# Patient Record
Sex: Male | Born: 1955 | Race: Black or African American | Hispanic: No | Marital: Married | Smoking: Current every day smoker
Health system: Southern US, Community
[De-identification: ages and names within clinical notes are randomized; demographics above are authoritative.]

## PROBLEM LIST (undated history)

## (undated) DIAGNOSIS — I2510 Atherosclerotic heart disease of native coronary artery without angina pectoris: Secondary | ICD-10-CM

## (undated) DIAGNOSIS — I219 Acute myocardial infarction, unspecified: Secondary | ICD-10-CM

## (undated) DIAGNOSIS — I10 Essential (primary) hypertension: Secondary | ICD-10-CM

## (undated) DIAGNOSIS — J449 Chronic obstructive pulmonary disease, unspecified: Secondary | ICD-10-CM

## (undated) DIAGNOSIS — G4733 Obstructive sleep apnea (adult) (pediatric): Secondary | ICD-10-CM

## (undated) DIAGNOSIS — G459 Transient cerebral ischemic attack, unspecified: Secondary | ICD-10-CM

## (undated) DIAGNOSIS — C3490 Malignant neoplasm of unspecified part of unspecified bronchus or lung: Secondary | ICD-10-CM

## (undated) DIAGNOSIS — E7800 Pure hypercholesterolemia, unspecified: Secondary | ICD-10-CM

## (undated) DIAGNOSIS — I639 Cerebral infarction, unspecified: Secondary | ICD-10-CM

## (undated) HISTORY — DX: Cerebral infarction, unspecified: I63.9

## (undated) HISTORY — DX: Acute myocardial infarction, unspecified: I21.9

## (undated) HISTORY — DX: Atherosclerotic heart disease of native coronary artery without angina pectoris: I25.10

## (undated) HISTORY — PX: CARDIAC CATHETERIZATION: SHX172

## (undated) HISTORY — PX: CORONARY STENT PLACEMENT: SHX1402

## (undated) HISTORY — DX: Chronic obstructive pulmonary disease, unspecified: J44.9

## (undated) HISTORY — DX: Malignant neoplasm of unspecified part of unspecified bronchus or lung: C34.90

## (undated) HISTORY — DX: Transient cerebral ischemic attack, unspecified: G45.9

## (undated) HISTORY — DX: Obstructive sleep apnea (adult) (pediatric): G47.33

---

## 2001-06-15 ENCOUNTER — Emergency Department (HOSPITAL_COMMUNITY): Admission: EM | Admit: 2001-06-15 | Discharge: 2001-06-15 | Payer: Self-pay | Admitting: Emergency Medicine

## 2003-08-13 ENCOUNTER — Ambulatory Visit (HOSPITAL_COMMUNITY): Admission: RE | Admit: 2003-08-13 | Discharge: 2003-08-13 | Payer: Self-pay | Admitting: Internal Medicine

## 2003-11-25 ENCOUNTER — Emergency Department (HOSPITAL_COMMUNITY): Admission: EM | Admit: 2003-11-25 | Discharge: 2003-11-25 | Payer: Self-pay | Admitting: Emergency Medicine

## 2005-01-25 ENCOUNTER — Emergency Department (HOSPITAL_COMMUNITY): Admission: EM | Admit: 2005-01-25 | Discharge: 2005-01-26 | Payer: Self-pay | Admitting: Emergency Medicine

## 2006-07-28 ENCOUNTER — Emergency Department (HOSPITAL_COMMUNITY): Admission: EM | Admit: 2006-07-28 | Discharge: 2006-07-29 | Payer: Self-pay | Admitting: Emergency Medicine

## 2006-07-30 ENCOUNTER — Ambulatory Visit (HOSPITAL_COMMUNITY): Admission: RE | Admit: 2006-07-30 | Discharge: 2006-07-30 | Payer: Self-pay | Admitting: Internal Medicine

## 2006-11-09 ENCOUNTER — Ambulatory Visit: Payer: Self-pay | Admitting: Gastroenterology

## 2006-11-14 ENCOUNTER — Encounter: Payer: Self-pay | Admitting: Internal Medicine

## 2006-11-14 ENCOUNTER — Ambulatory Visit: Payer: Self-pay | Admitting: Internal Medicine

## 2006-11-14 ENCOUNTER — Ambulatory Visit (HOSPITAL_COMMUNITY): Admission: RE | Admit: 2006-11-14 | Discharge: 2006-11-14 | Payer: Self-pay | Admitting: Internal Medicine

## 2006-11-14 HISTORY — PX: COLONOSCOPY: SHX174

## 2009-06-09 ENCOUNTER — Ambulatory Visit (HOSPITAL_COMMUNITY): Admission: RE | Admit: 2009-06-09 | Discharge: 2009-06-09 | Payer: Self-pay | Admitting: Urology

## 2010-05-17 NOTE — Consult Note (Signed)
Trevor Key, CLABO                ACCOUNT NO.:  [id number]   MEDICAL RECORD NO.:  [id number]          PATIENT TYPE:  AMB   LOCATION:  DAY                           FACILITY:  [facility]   PHYSICIAN:  R. Roetta Sessions, M.D. DATE OF BIRTH:  Nov 26, 1955   DATE OF CONSULTATION:  11/09/2006  DATE OF DISCHARGE:                                 CONSULTATION   PRIMARY CARE PHYSICIAN:  Avon Gully, M.D.   HISTORY OF PRESENT ILLNESS:  The patient is a 55 year old African-  American male who presents today for further evaluation of hematochezia.  He has seen blood in his stool for about 3 to 4 weeks. It occurs almost  on a daily basis. He denies any constipation, melena, or diarrhea. His  bowel movements are regular. He denies any ongoing abdominal pain,  nausea, or vomiting. He states about a month ago, he developed  epigastric pain when he was driving back home. He is a Naval architect. He  states it was quite severe and doubled him over. He was seen in the  emergency department. He states he was told it was due to acid reflux.  He has been on Omeprazole since that time. I looked up in E-chart and  found where he was seen in July for abdominal pain. At that time, he  also complained of blood in the stool and was Hemoccult positive on  rectal examination. He had a normal hemoglobin at that time of 14.8. On  urinalysis, he had a small hemoglobin, many bacteria, white blood cells  and red blood cells. He had a CT of the abdomen and pelvis, which  revealed questionably enlarged lymph node inferior left pulmonary hilum,  measuring 14 x 12 mm, upper normal caliber pancreatic duct 3 to 4 mm,  without focal pancreatic mass, fold at the gallbladder fundus. The  patient was treated with Ciprofloxacin and Flagyl for unclear reasons at  that time. He has never had a colonoscopy or EGD.   CURRENT MEDICATIONS:  Omeprazole 20 mg daily.   ALLERGIES:  NO KNOWN DRUG ALLERGIES.   PAST MEDICAL HISTORY:  He is on  Omeprazole for presumed gastroesophageal  reflux disease, however, denies any typical heartburn symptoms. He has  had no further abdominal pain.   PAST SURGICAL HISTORY:  No prior surgeries.   FAMILY HISTORY:  Mother was treated successfully for breast cancer. She  also has hypertension. Father deceased age 36, had hypertension,  myocardial infarction and stroke. No family history of colorectal  cancer.   SOCIAL HISTORY:  He is married. He has 3 children. His wife works at the  [employer]. 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 He smokes 1/2 pack of cigarettes daily. No alcohol use.   REVIEW OF SYSTEMS:  See HPI for GI. CONSTITUTIONAL:  No weight loss.  CARDIOPULMONARY:  No chest pain or shortness of breath. GENITOURINARY:  Denies dysuria or hematuria.   PHYSICAL EXAMINATION:  VITAL SIGNS:  Weight 159. Height 5 foot 2.  Temperature 98, blood pressure 124/90, pulse 88.  GENERAL:  Pleasant, well developed, well nourished, African-American  male in no acute distress.  SKIN:  Warm and dry. No jaundice.  HEENT:  Sclerae non-icteric. Oral mucosa moist and pink. No erythema,  lesions, or exudate.  NECK:  No lymphadenopathy or thyromegaly.  CHEST:  Lungs clear to auscultation.  CARDIOVASCULAR:  Regular rate and rhythm. Normal S1 and S2. No murmur,  rub, or gallop.  ABDOMEN:  Positive bowel sounds. Soft, nontender, and nondistended. No  organomegaly or masses. No rebound tenderness or guarding. No abdominal  bruits or hernias.  EXTREMITIES:  No edema.   IMPRESSION:  Mr. Hallahan is a 55 year old gentleman with 3 to 4 week  history of hematochezia. He has never had his colon imaged. I have  discussed risks, alternatives, and benefits with regards to risk of  reaction to medication, bleeding, perforation and the patient is  agreeable to proceed with a colonoscopy at this time.   PLAN:  Colonoscopy in the near future with Dr. Jena Gauss.   I would like to thank Dr.  John Giovanni for allowing [place] to  participate in the care of this patient.      Tana Coast, P.AJonathon Bellows, M.D.  Electronically Signed    LL/MEDQ  D:  11/09/2006  T:  11/10/2006  Job:  [id number]   cc:   Mila Homer. Sudie Bailey, M.D.  Fax: [phone]   Tesfaye D. Felecia Shelling, MD  Fax: [phone]

## 2010-05-17 NOTE — Op Note (Signed)
Trevor Key, Trevor Key                ACCOUNT NO.:  [id number]   MEDICAL RECORD NO.:  [id number]          PATIENT TYPE:  AMB   LOCATION:  DAY                           FACILITY:  [facility]   PHYSICIAN:  R. Roetta Sessions, M.D. DATE OF BIRTH:  [birth year]   DATE OF PROCEDURE:  11/14/2006  DATE OF DISCHARGE:                               OPERATIVE REPORT   PROCEDURE:  Colonoscopy with snare polypectomy.   INDICATIONS FOR PROCEDURE:  A 55 year old African-American male with  recent hematochezia.  Colonoscopy is now being done.  This approach,  potential risks, benefits and alternatives, have been reviewed,  questions answered, is agreeable.  Please see documentation in medical  record.   PROCEDURE NOTE:  O2 saturation, blood pressure, pulse, respirations  monitored throughout entire procedure.   CONSCIOUS SEDATION:  Versed 3 mg IV, Demerol 75 mg IV in divided doses.   INSTRUMENT:  Pentax video chip system.   Digital rectal exam revealed no abnormalities.   ENDOSCOPIC FINDINGS:  The prep was adequate.  Examination rectal mucosa  revealed a 1.5 cm mottled, round, pedunculated polyp suspicious for  carcinoid 4 cm inside the anal verge.  The rectal vault was small.  I  was unable to retroflex but for same reason I was able see the rectal  mucosa very well en face.   Colon:  Colonic mucosa was surveyed from rectosigmoid junction through  the left, transverse, right colon, to area of appendiceal orifice,  ileocecal valve and cecum.  These structures well seen photographed for  the record.  From this level scope was withdrawn.  All previously  mentioned mucosal surfaces were again seen.  There is a 5 mm polyp in  the ascending colon and mid descending colon both of which were cold  snared.  There was a question of a flat saucer shaped polyp on a fold in  the area of the hepatic flexure but this could not be confirmed.  There  is suggestion of this going in but coming out reviewed the mucosa  carefully and was not able to find any other polyps.  The larger lesion  in the rectum was engaged with snare and removed with hot snare cautery  technique with one pass and specimens were recovered.  The patient also  was noted have right-sided diverticula.  The patient tolerated procedure  well was reacted in endoscopy.   IMPRESSION:  1. Mottled around pedunculated polyp suspicious for carcinoid removed      cleanly with the hot snare likely represents cause of the patient's      hematochezia.  2. A 5 mm middescending and ascending colon polyps removed with cold      snare technique, right sided diverticula.  Remainder of colonic      mucosa appeared normal.   RECOMMENDATIONS:  1. Follow-up on path.  2. Diverticulosis literature provided Mr. Parmelee.  3. Further recommendations to follow.      Jonathon Bellows, M.D.  Electronically Signed     RMR/MEDQ  D:  11/14/2006  T:  11/15/2006  Job:  [id number]   cc:  Tesfaye D. Felecia Shelling, MD  Fax: [phone]   Mila Homer. Sudie Bailey, M.D.  Fax: [phone]

## 2010-10-17 LAB — CULTURE, BLOOD (ROUTINE X 2)
Culture: NO GROWTH
Report Status: 7312008

## 2010-10-17 LAB — URINALYSIS, ROUTINE W REFLEX MICROSCOPIC
Bilirubin Urine: NEGATIVE
Leukocytes, UA: NEGATIVE
Nitrite: NEGATIVE
Urobilinogen, UA: 1

## 2010-10-17 LAB — URINE MICROSCOPIC-ADD ON

## 2010-10-17 LAB — DIFFERENTIAL
Basophils Relative: 1
Eosinophils Absolute: 0.1

## 2010-10-17 LAB — BASIC METABOLIC PANEL
Creatinine, Ser: 1.2
GFR calc Af Amer: >60
Potassium: 3.8
Sodium: 140

## 2010-10-17 LAB — URINE CULTURE
Colony Count: NO GROWTH
Culture: NO GROWTH

## 2010-10-17 LAB — CBC
HCT: 43.4
MCHC: 34.1
MCV: 86.2
Platelets: 356
RBC: 5.04
RDW: 13.9

## 2010-10-17 LAB — PROTIME-INR
INR: 1
Prothrombin Time: 13.3

## 2011-01-23 ENCOUNTER — Telehealth: Payer: Self-pay

## 2011-01-23 ENCOUNTER — Encounter: Payer: Self-pay | Admitting: Cardiology

## 2011-01-23 ENCOUNTER — Ambulatory Visit (INDEPENDENT_AMBULATORY_CARE_PROVIDER_SITE_OTHER): Payer: 59 | Admitting: Cardiology

## 2011-01-23 VITALS — BP 118/81 | HR 64 | Resp 18 | Ht 62.0 in | Wt 153.0 lb

## 2011-01-23 DIAGNOSIS — I2109 ST elevation (STEMI) myocardial infarction involving other coronary artery of anterior wall: Secondary | ICD-10-CM | POA: Insufficient documentation

## 2011-01-23 DIAGNOSIS — E782 Mixed hyperlipidemia: Secondary | ICD-10-CM

## 2011-01-23 DIAGNOSIS — F17200 Nicotine dependence, unspecified, uncomplicated: Secondary | ICD-10-CM | POA: Insufficient documentation

## 2011-01-23 DIAGNOSIS — I2510 Atherosclerotic heart disease of native coronary artery without angina pectoris: Secondary | ICD-10-CM

## 2011-01-23 NOTE — Patient Instructions (Signed)
Your physician recommends that you continue on your current medications as directed. Please refer to the Current Medication list given to you today.  Your physician has requested that you have an echocardiogram. Echocardiography is a painless test that uses sound waves to create images of your heart. It provides your doctor with information about the size and shape of your heart and how well your heart's chambers and valves are working. This procedure takes approximately one hour. There are no restrictions for this procedure.  Your physician recommends that you schedule a follow-up appointment in: 2 to 3 weeks

## 2011-01-23 NOTE — Assessment & Plan Note (Signed)
Lipid numbers to follow with records. Now on Lipitor.
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
## 2011-01-23 NOTE — Telephone Encounter (Signed)
**Note De-Identified Hilaria Titsworth Obfuscation** Pt's wife, Cordelia Pen, called to give information concerning pt's stent.  Bare Metal-MR 3.0 mg X 20

## 2011-01-23 NOTE — Assessment & Plan Note (Signed)
Need records from [place] for review. Asked that they call back with information from stent card as well. Continue present regimen and begin walking regimen - cardiac rehabilitation also discussed. Will reassess LVEF prior to next visit within the next few weeks. 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 Anticipate at least 4 weeks out after infarct.

## 2011-01-23 NOTE — Assessment & Plan Note (Signed)
Will review catheterization report when available - images showed essentially single vessel disease invovling LAD.

## 2011-01-23 NOTE — Assessment & Plan Note (Signed)
We discussed importance on smoking cessation.

## 2011-01-23 NOTE — Telephone Encounter (Signed)
Pt's wife, Cordelia Pen, called to give information on pt's stent. Bare metal-MR 3.0 mg X 20 Veriflex [id number] Lot # [id number]

## 2011-01-25 NOTE — Progress Notes (Signed)
Received and reviewed records from [facility] in [place]. Patient had AMI complicated by pre-revascularization VF arrest. He underwent cardiac catheterization on January 10 which revealed normal left main, 99% proximal LAD stenosis with thrombus, normal diagonal, normal circumflex, normal RCA. LVEF was 40-45%. He underwent placement of a Veriflex bare metal stent to the proximal LAD. Lab work showed cholesterol 216, HDL 60, LDL 147, triglycerides 59, troponin I increased from 0.2 up to 18.5. ECG's scanned.

## 2011-01-30 ENCOUNTER — Ambulatory Visit (HOSPITAL_COMMUNITY)
Admission: RE | Admit: 2011-01-30 | Discharge: 2011-01-30 | Disposition: A | Discharge status: Home / Self Care | Payer: 59 | Source: Ambulatory Visit | Attending: Cardiology | Admitting: Cardiology

## 2011-01-30 DIAGNOSIS — I517 Cardiomegaly: Secondary | ICD-10-CM

## 2011-01-30 DIAGNOSIS — F17200 Nicotine dependence, unspecified, uncomplicated: Secondary | ICD-10-CM | POA: Insufficient documentation

## 2011-01-30 DIAGNOSIS — E785 Hyperlipidemia, unspecified: Secondary | ICD-10-CM | POA: Insufficient documentation

## 2011-01-30 DIAGNOSIS — I2510 Atherosclerotic heart disease of native coronary artery without angina pectoris: Secondary | ICD-10-CM | POA: Insufficient documentation

## 2011-01-30 NOTE — Progress Notes (Signed)
*  PRELIMINARY RESULTS* Echocardiogram 2D Echocardiogram has been performed.  Conrad Placedo 01/30/2011, 12:09 PM

## 2011-02-03 ENCOUNTER — Ambulatory Visit (HOSPITAL_COMMUNITY)
Admission: RE | Admit: 2011-02-03 | Discharge: 2011-02-03 | Disposition: A | Discharge status: Home / Self Care | Payer: 59 | Source: Ambulatory Visit | Attending: Internal Medicine | Admitting: Internal Medicine

## 2011-02-03 ENCOUNTER — Other Ambulatory Visit (HOSPITAL_COMMUNITY): Payer: Self-pay | Admitting: Internal Medicine

## 2011-02-03 DIAGNOSIS — R079 Chest pain, unspecified: Secondary | ICD-10-CM | POA: Insufficient documentation

## 2011-02-06 ENCOUNTER — Other Ambulatory Visit: Payer: Self-pay

## 2011-02-06 ENCOUNTER — Ambulatory Visit (INDEPENDENT_AMBULATORY_CARE_PROVIDER_SITE_OTHER): Payer: 59 | Admitting: Cardiology

## 2011-02-06 ENCOUNTER — Encounter: Payer: Self-pay | Admitting: Cardiology

## 2011-02-06 VITALS — BP 139/82 | HR 57 | Resp 16 | Ht 62.0 in | Wt 151.0 lb

## 2011-02-06 DIAGNOSIS — I2510 Atherosclerotic heart disease of native coronary artery without angina pectoris: Secondary | ICD-10-CM

## 2011-02-06 DIAGNOSIS — F17200 Nicotine dependence, unspecified, uncomplicated: Secondary | ICD-10-CM

## 2011-02-06 DIAGNOSIS — E782 Mixed hyperlipidemia: Secondary | ICD-10-CM

## 2011-02-06 DIAGNOSIS — I2109 ST elevation (STEMI) myocardial infarction involving other coronary artery of anterior wall: Secondary | ICD-10-CM

## 2011-02-06 NOTE — Assessment & Plan Note (Signed)
No recurrent chest pain. LVEF has normalized by echocardiogram. Plan to continue medical therapy. Referral made to cardiac rehabilitation. Also discussed smoking cessation. I would expect that DOT will see him for interval evaluation.

## 2011-02-06 NOTE — Patient Instructions (Signed)
**Note De-Identified Lazarius Rivkin Obfuscation** Your physician recommends that you return for lab work in: 2 months (just before next office visit.  Your physician recommends that you start cardiac rehab, we will arrange.  Your physician recommends that you schedule a follow-up appointment in: 2 months

## 2011-02-06 NOTE — Assessment & Plan Note (Signed)
Discussed smoking cessation again °

## 2011-02-06 NOTE — Assessment & Plan Note (Signed)
Continue statin therapy and recheck FLP/LFT for next visit.

## 2011-02-06 NOTE — Progress Notes (Signed)
   Clinical Summary Trevor Key is a 56 y.o.male presenting for followup. He was seen recently on 1/21.  Received and reviewed records from [facility] in [place]. Patient had AMI complicated by pre-revascularization VF arrest. He underwent cardiac catheterization on January 10 which revealed normal left main, 99% proximal LAD stenosis with thrombus, normal diagonal, normal circumflex, normal RCA. LVEF was 40-45%. He underwent placement of a Veriflex bare metal stent to the proximal LAD. Lab work showed cholesterol 216, HDL 60, LDL 147, triglycerides 59, troponin I increased from 0.2 up to 18.5. ECG's scanned.  Subsequent echocardiogram on 1/29 showed improvement in LVEF to the range of 55-60%. We discussed this today.  He states that he is somewhat short of breath with exertion, but is still smoking and has not been exercising. We discussed both issues today including cardiac rehabilitation.  He states that he contacted the DOT about his heart attack.   No Known Allergies  Current Outpatient Prescriptions  Medication Sig Dispense Refill  . aspirin 325 MG tablet Take 325 mg by mouth daily.      Marland Kitchen atorvastatin (LIPITOR) 40 MG tablet Take 40 mg by mouth daily.      . cloNIDine (CATAPRES) 0.1 MG tablet Take 0.1 mg by mouth 2 (two) times daily.      . clopidogrel (PLAVIX) 75 MG tablet Take 75 mg by mouth daily.      Marland Kitchen lisinopril (PRINIVIL,ZESTRIL) 10 MG tablet Take 10 mg by mouth daily.      . metoprolol (LOPRESSOR) 50 MG tablet Take 50 mg by mouth 2 (two) times daily.      . nitroGLYCERIN (NITROLINGUAL) 0.4 MG/SPRAY spray Place 1 spray under the tongue every 5 (five) minutes as needed.        Past Medical History  Diagnosis Date  . Coronary atherosclerosis of native coronary artery     BMS LAD 1/13, LVEF 55-60%  . MI (myocardial infarction)     AMI 1/13    Social History Trevor Key reports that he has been smoking Cigarettes.  He has never used smokeless  tobacco. Trevor Key reports that he does not drink alcohol.  Review of Systems No paliptations, orthopnea, PND. No bleeding problems.  Physical Examination Filed Vitals:   02/06/11 0823  BP: 139/82  Pulse: 57  Resp: 16    Normally nourished appearing male in NAD.  HEENT: Conjunctiva and lids normal, oropharynx clear.  Neck: Supple, no elevated JVP or carotid bruits, no thyromegaly.  Lungs: Clear to auscultation, nonlabored breathing at rest.  Cardiac: Regular rate and rhythm, no S3 or significant systolic murmur, no pericardial rub.  Abdomen: Soft, nontender, bowel sounds present, no guarding or rebound.  Extremities: No pitting edema, distal pulses 2+.  Skin: Warm and dry.  Musculoskeletal: No kyphosis.  Neuropsychiatric: Alert and oriented x3, affect grossly appropriate.    Problem List and Plan

## 2011-02-06 NOTE — Assessment & Plan Note (Signed)
No significant CAD in non-infarct related vessels based on records.

## 2011-02-07 ENCOUNTER — Encounter (HOSPITAL_COMMUNITY)
Admission: RE | Admit: 2011-02-07 | Discharge: 2011-02-07 | Disposition: A | Discharge status: Home / Self Care | Payer: 59 | Source: Ambulatory Visit | Attending: Cardiology | Admitting: Cardiology

## 2011-02-07 ENCOUNTER — Encounter (HOSPITAL_COMMUNITY): Payer: Self-pay

## 2011-02-07 DIAGNOSIS — I252 Old myocardial infarction: Secondary | ICD-10-CM | POA: Insufficient documentation

## 2011-02-07 DIAGNOSIS — Z9861 Coronary angioplasty status: Secondary | ICD-10-CM | POA: Insufficient documentation

## 2011-02-07 DIAGNOSIS — Z5189 Encounter for other specified aftercare: Secondary | ICD-10-CM | POA: Insufficient documentation

## 2011-02-07 NOTE — Patient Instructions (Signed)
Pt has finished orientation and is scheduled to start CR on 02/13/11 at 8:15. Pt has been instructed to arrive to class 15 minutes early for scheduled class. Pt has been instructed to wear comfortable clothing and shoes with rubber soles. Pt has been told to take their medications 1 hour prior to coming to class.  If the patient is not going to attend class, he/she has been instructed to call.

## 2011-02-13 ENCOUNTER — Encounter (HOSPITAL_COMMUNITY)
Admission: RE | Admit: 2011-02-13 | Discharge: 2011-02-13 | Disposition: A | Discharge status: Home / Self Care | Payer: 59 | Source: Ambulatory Visit | Attending: Cardiology | Admitting: Cardiology

## 2011-02-15 ENCOUNTER — Encounter (HOSPITAL_COMMUNITY)
Admission: RE | Admit: 2011-02-15 | Discharge: 2011-02-15 | Disposition: A | Discharge status: Home / Self Care | Payer: 59 | Source: Ambulatory Visit | Attending: Cardiology | Admitting: Cardiology

## 2011-02-17 ENCOUNTER — Encounter (HOSPITAL_COMMUNITY): Payer: 59

## 2011-02-20 ENCOUNTER — Encounter (HOSPITAL_COMMUNITY)
Admission: RE | Admit: 2011-02-20 | Discharge: 2011-02-20 | Disposition: A | Discharge status: Home / Self Care | Payer: 59 | Source: Ambulatory Visit | Attending: Cardiology | Admitting: Cardiology

## 2011-02-22 ENCOUNTER — Encounter (HOSPITAL_COMMUNITY)
Admission: RE | Admit: 2011-02-22 | Discharge: 2011-02-22 | Disposition: A | Discharge status: Home / Self Care | Payer: 59 | Source: Ambulatory Visit | Attending: Cardiology | Admitting: Cardiology

## 2011-02-23 ENCOUNTER — Other Ambulatory Visit: Payer: Self-pay | Admitting: *Deleted

## 2011-02-23 DIAGNOSIS — E782 Mixed hyperlipidemia: Secondary | ICD-10-CM

## 2011-02-24 ENCOUNTER — Encounter (HOSPITAL_COMMUNITY): Payer: 59

## 2011-02-27 ENCOUNTER — Encounter (HOSPITAL_COMMUNITY)
Admission: RE | Admit: 2011-02-27 | Discharge: 2011-02-27 | Disposition: A | Discharge status: Home / Self Care | Payer: 59 | Source: Ambulatory Visit | Attending: Cardiology | Admitting: Cardiology

## 2011-03-01 ENCOUNTER — Encounter (HOSPITAL_COMMUNITY)
Admission: RE | Admit: 2011-03-01 | Discharge: 2011-03-01 | Disposition: A | Discharge status: Home / Self Care | Payer: 59 | Source: Ambulatory Visit | Attending: Cardiology | Admitting: Cardiology

## 2011-03-03 ENCOUNTER — Encounter (HOSPITAL_COMMUNITY)
Admission: RE | Admit: 2011-03-03 | Discharge: 2011-03-03 | Disposition: A | Discharge status: Home / Self Care | Payer: 59 | Source: Ambulatory Visit | Attending: Cardiology | Admitting: Cardiology

## 2011-03-03 DIAGNOSIS — Z9861 Coronary angioplasty status: Secondary | ICD-10-CM | POA: Insufficient documentation

## 2011-03-03 DIAGNOSIS — I252 Old myocardial infarction: Secondary | ICD-10-CM | POA: Insufficient documentation

## 2011-03-03 DIAGNOSIS — Z5189 Encounter for other specified aftercare: Secondary | ICD-10-CM | POA: Insufficient documentation

## 2011-03-06 ENCOUNTER — Encounter (HOSPITAL_COMMUNITY)
Admission: RE | Admit: 2011-03-06 | Discharge: 2011-03-06 | Disposition: A | Discharge status: Home / Self Care | Payer: 59 | Source: Ambulatory Visit | Attending: Cardiology | Admitting: Cardiology

## 2011-03-08 ENCOUNTER — Encounter (HOSPITAL_COMMUNITY)
Admission: RE | Admit: 2011-03-08 | Discharge: 2011-03-08 | Disposition: A | Discharge status: Home / Self Care | Payer: 59 | Source: Ambulatory Visit | Attending: Cardiology | Admitting: Cardiology

## 2011-03-10 ENCOUNTER — Encounter (HOSPITAL_COMMUNITY)
Admission: RE | Admit: 2011-03-10 | Discharge: 2011-03-10 | Disposition: A | Discharge status: Home / Self Care | Payer: 59 | Source: Ambulatory Visit | Attending: Cardiology | Admitting: Cardiology

## 2011-03-13 ENCOUNTER — Encounter (HOSPITAL_COMMUNITY): Payer: 59

## 2011-03-15 ENCOUNTER — Encounter (HOSPITAL_COMMUNITY)
Admission: RE | Admit: 2011-03-15 | Discharge: 2011-03-15 | Disposition: A | Discharge status: Home / Self Care | Payer: 59 | Source: Ambulatory Visit | Attending: Cardiology | Admitting: Cardiology

## 2011-03-17 ENCOUNTER — Encounter (HOSPITAL_COMMUNITY)
Admission: RE | Admit: 2011-03-17 | Discharge: 2011-03-17 | Disposition: A | Discharge status: Home / Self Care | Payer: 59 | Source: Ambulatory Visit | Attending: Cardiology | Admitting: Cardiology

## 2011-03-17 DIAGNOSIS — Z9861 Coronary angioplasty status: Secondary | ICD-10-CM

## 2011-03-20 ENCOUNTER — Encounter (HOSPITAL_COMMUNITY)
Admission: RE | Admit: 2011-03-20 | Discharge: 2011-03-20 | Disposition: A | Discharge status: Home / Self Care | Payer: 59 | Source: Ambulatory Visit | Attending: Cardiology | Admitting: Cardiology

## 2011-03-22 ENCOUNTER — Encounter (HOSPITAL_COMMUNITY)
Admission: RE | Admit: 2011-03-22 | Discharge: 2011-03-22 | Disposition: A | Discharge status: Home / Self Care | Payer: 59 | Source: Ambulatory Visit | Attending: Cardiology | Admitting: Cardiology

## 2011-03-24 ENCOUNTER — Encounter (HOSPITAL_COMMUNITY)
Admission: RE | Admit: 2011-03-24 | Discharge: 2011-03-24 | Disposition: A | Discharge status: Home / Self Care | Payer: 59 | Source: Ambulatory Visit | Attending: Cardiology | Admitting: Cardiology

## 2011-03-27 ENCOUNTER — Other Ambulatory Visit: Payer: Self-pay | Admitting: Cardiology

## 2011-03-27 ENCOUNTER — Encounter (HOSPITAL_COMMUNITY)
Admission: RE | Admit: 2011-03-27 | Discharge: 2011-03-27 | Disposition: A | Discharge status: Home / Self Care | Payer: 59 | Source: Ambulatory Visit | Attending: Cardiology | Admitting: Cardiology

## 2011-03-28 LAB — HEPATIC FUNCTION PANEL
ALT: 21 U/L (ref 0–53)
AST: 18 U/L (ref 0–37)
Albumin: 4.2 g/dL (ref 3.5–5.2)
Alkaline Phosphatase: 73 U/L (ref 39–117)
Total Bilirubin: 0.8 mg/dL (ref 0.3–1.2)

## 2011-03-28 LAB — LIPID PANEL
Cholesterol: 118 mg/dL (ref 0–200)
HDL: 33 mg/dL — ABNORMAL LOW (ref >39)
Total CHOL/HDL Ratio: 3.6 Ratio
Triglycerides: 96 mg/dL (ref <150)

## 2011-03-29 ENCOUNTER — Encounter (HOSPITAL_COMMUNITY)
Admission: RE | Admit: 2011-03-29 | Discharge: 2011-03-29 | Disposition: A | Discharge status: Home / Self Care | Payer: 59 | Source: Ambulatory Visit | Attending: Cardiology | Admitting: Cardiology

## 2011-03-31 ENCOUNTER — Encounter (HOSPITAL_COMMUNITY)
Admission: RE | Admit: 2011-03-31 | Discharge: 2011-03-31 | Disposition: A | Discharge status: Home / Self Care | Payer: 59 | Source: Ambulatory Visit | Attending: Cardiology | Admitting: Cardiology

## 2011-04-03 ENCOUNTER — Encounter (HOSPITAL_COMMUNITY)
Admission: RE | Admit: 2011-04-03 | Discharge: 2011-04-03 | Disposition: A | Discharge status: Home / Self Care | Payer: 59 | Source: Ambulatory Visit | Attending: Cardiology | Admitting: Cardiology

## 2011-04-03 DIAGNOSIS — I252 Old myocardial infarction: Secondary | ICD-10-CM | POA: Insufficient documentation

## 2011-04-03 DIAGNOSIS — Z9861 Coronary angioplasty status: Secondary | ICD-10-CM | POA: Insufficient documentation

## 2011-04-03 DIAGNOSIS — Z5189 Encounter for other specified aftercare: Secondary | ICD-10-CM | POA: Insufficient documentation

## 2011-04-05 ENCOUNTER — Encounter (HOSPITAL_COMMUNITY)
Admission: RE | Admit: 2011-04-05 | Discharge: 2011-04-05 | Disposition: A | Discharge status: Home / Self Care | Payer: 59 | Source: Ambulatory Visit | Attending: Cardiology | Admitting: Cardiology

## 2011-04-07 ENCOUNTER — Ambulatory Visit: Payer: 59 | Admitting: Cardiology

## 2011-04-07 ENCOUNTER — Encounter (HOSPITAL_COMMUNITY)
Admission: RE | Admit: 2011-04-07 | Discharge: 2011-04-07 | Disposition: A | Discharge status: Home / Self Care | Payer: 59 | Source: Ambulatory Visit | Attending: Cardiology | Admitting: Cardiology

## 2011-04-10 ENCOUNTER — Encounter (HOSPITAL_COMMUNITY)
Admission: RE | Admit: 2011-04-10 | Discharge: 2011-04-10 | Disposition: A | Discharge status: Home / Self Care | Payer: 59 | Source: Ambulatory Visit | Attending: Cardiology | Admitting: Cardiology

## 2011-04-12 ENCOUNTER — Encounter (HOSPITAL_COMMUNITY)
Admission: RE | Admit: 2011-04-12 | Discharge: 2011-04-12 | Disposition: A | Discharge status: Home / Self Care | Payer: 59 | Source: Ambulatory Visit | Attending: Cardiology | Admitting: Cardiology

## 2011-04-14 ENCOUNTER — Encounter (HOSPITAL_COMMUNITY)
Admission: RE | Admit: 2011-04-14 | Discharge: 2011-04-14 | Disposition: A | Discharge status: Home / Self Care | Payer: 59 | Source: Ambulatory Visit | Attending: Cardiology | Admitting: Cardiology

## 2011-04-17 ENCOUNTER — Encounter (HOSPITAL_COMMUNITY): Payer: 59

## 2011-04-19 ENCOUNTER — Encounter (HOSPITAL_COMMUNITY)
Admission: RE | Admit: 2011-04-19 | Discharge: 2011-04-19 | Disposition: A | Discharge status: Home / Self Care | Payer: 59 | Source: Ambulatory Visit | Attending: Cardiology | Admitting: Cardiology

## 2011-04-21 ENCOUNTER — Encounter (HOSPITAL_COMMUNITY)
Admission: RE | Admit: 2011-04-21 | Discharge: 2011-04-21 | Disposition: A | Discharge status: Home / Self Care | Payer: 59 | Source: Ambulatory Visit | Attending: Cardiology | Admitting: Cardiology

## 2011-04-24 ENCOUNTER — Encounter (HOSPITAL_COMMUNITY)
Admission: RE | Admit: 2011-04-24 | Discharge: 2011-04-24 | Disposition: A | Discharge status: Home / Self Care | Payer: 59 | Source: Ambulatory Visit | Attending: Cardiology | Admitting: Cardiology

## 2011-04-26 ENCOUNTER — Encounter (HOSPITAL_COMMUNITY)
Admission: RE | Admit: 2011-04-26 | Discharge: 2011-04-26 | Disposition: A | Discharge status: Home / Self Care | Payer: 59 | Source: Ambulatory Visit | Attending: Cardiology | Admitting: Cardiology

## 2011-04-28 ENCOUNTER — Encounter: Payer: Self-pay | Admitting: Cardiology

## 2011-04-28 ENCOUNTER — Encounter (HOSPITAL_COMMUNITY)
Admission: RE | Admit: 2011-04-28 | Discharge: 2011-04-28 | Disposition: A | Discharge status: Home / Self Care | Payer: 59 | Source: Ambulatory Visit | Attending: Cardiology | Admitting: Cardiology

## 2011-04-28 ENCOUNTER — Ambulatory Visit (INDEPENDENT_AMBULATORY_CARE_PROVIDER_SITE_OTHER): Payer: 59 | Admitting: Cardiology

## 2011-04-28 VITALS — BP 151/87 | HR 48 | Resp 16 | Ht 62.0 in | Wt 151.0 lb

## 2011-04-28 DIAGNOSIS — R05 Cough: Secondary | ICD-10-CM | POA: Insufficient documentation

## 2011-04-28 DIAGNOSIS — I2510 Atherosclerotic heart disease of native coronary artery without angina pectoris: Secondary | ICD-10-CM

## 2011-04-28 DIAGNOSIS — E782 Mixed hyperlipidemia: Secondary | ICD-10-CM

## 2011-04-28 MED ORDER — LOSARTAN POTASSIUM 50 MG PO TABS: 50.0000 mg | ORAL_TABLET | Freq: Every day | ORAL | Status: DC

## 2011-04-28 NOTE — Assessment & Plan Note (Signed)
Possibly related to Lisinopril. Will stop and switch to Cozaar 50 mg daily.

## 2011-04-28 NOTE — Progress Notes (Signed)
   Clinical Summary [name] is a 56 y.o.male presenting for followup. He was seen in February. He is herre with his wife and sister. Still participating in cardiac rehabilitation. 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  Recent lab work showed cholesterol 118, triglycerides 96, HDL 33, LDL 66, AST 18, ALT 21. We reviewed this today. He reports compliance with his medications.  Also reports dry cough over several weeks. No orthopnea or PND. No fevers or chills.  No Known Allergies  Current Outpatient Prescriptions  Medication Sig Dispense Refill  . aspirin 325 MG tablet Take 325 mg by mouth daily.      Marland Kitchen atorvastatin (LIPITOR) 40 MG tablet Take 40 mg by mouth daily.      . cloNIDine (CATAPRES) 0.1 MG tablet Take 0.1 mg by mouth 2 (two) times daily.      . clopidogrel (PLAVIX) 75 MG tablet Take 75 mg by mouth daily.      . metoprolol (LOPRESSOR) 50 MG tablet Take 50 mg by mouth 2 (two) times daily.      . nitroGLYCERIN (NITROLINGUAL) 0.4 MG/SPRAY spray Place 1 spray under the tongue every 5 (five) minutes as needed.        Past Medical History  Diagnosis Date  . Coronary atherosclerosis of native coronary artery     BMS LAD 1/13, LVEF 55-60%  . MI (myocardial infarction)     AMI 1/13    Social History [name] reports that he has been smoking Cigarettes.  He has never used smokeless tobacco. [name] reports that he does not drink alcohol.  Review of Systems Negative except as outlined above. Otherwise negative.  Physical Examination Filed Vitals:   04/28/11 0935  BP: 151/87  Pulse: 48  Resp: 16   Normally nourished appearing male in NAD.  HEENT: Conjunctiva and lids normal, oropharynx clear.  Neck: Supple, no elevated JVP or carotid bruits, no thyromegaly.  Lungs: Clear to auscultation, nonlabored breathing at rest.  Cardiac: Regular rate and rhythm, no S3 or significant systolic murmur, no pericardial rub.  Abdomen: Soft,  nontender, bowel sounds present, no guarding or rebound.  Extremities: No pitting edema, distal pulses 2+.  Skin: Warm and dry.  Musculoskeletal: No kyphosis.  Neuropsychiatric: Alert and oriented x3, affect grossly appropriate.    Problem List and Plan

## 2011-04-28 NOTE — Assessment & Plan Note (Signed)
LDL well controlled. No changes to Lipitor.

## 2011-04-28 NOTE — Assessment & Plan Note (Signed)
No angina on medical therapy. Plan to continue cardiac rehabilitation. Expect that he will be able to return to previous work status soon.

## 2011-04-28 NOTE — Patient Instructions (Signed)
**Note De-Identified Britni Driscoll Obfuscation** Your physician has recommended you make the following change in your medication: stop taking Lisinopril and start taking Cozaar 50 mg daily  Your physician recommends that you schedule a follow-up appointment in: 3 months

## 2011-05-01 ENCOUNTER — Encounter (HOSPITAL_COMMUNITY)
Admission: RE | Admit: 2011-05-01 | Discharge: 2011-05-01 | Disposition: A | Discharge status: Home / Self Care | Payer: 59 | Source: Ambulatory Visit | Attending: Cardiology | Admitting: Cardiology

## 2011-05-03 ENCOUNTER — Encounter (HOSPITAL_COMMUNITY)
Admission: RE | Admit: 2011-05-03 | Discharge: 2011-05-03 | Disposition: A | Discharge status: Home / Self Care | Payer: 59 | Source: Ambulatory Visit | Attending: Cardiology | Admitting: Cardiology

## 2011-05-03 DIAGNOSIS — Z5189 Encounter for other specified aftercare: Secondary | ICD-10-CM | POA: Insufficient documentation

## 2011-05-03 DIAGNOSIS — Z9861 Coronary angioplasty status: Secondary | ICD-10-CM | POA: Insufficient documentation

## 2011-05-03 DIAGNOSIS — I252 Old myocardial infarction: Secondary | ICD-10-CM | POA: Insufficient documentation

## 2011-05-05 ENCOUNTER — Encounter (HOSPITAL_COMMUNITY)
Admission: RE | Admit: 2011-05-05 | Discharge: 2011-05-05 | Disposition: A | Discharge status: Home / Self Care | Payer: 59 | Source: Ambulatory Visit | Attending: Cardiology | Admitting: Cardiology

## 2011-05-08 ENCOUNTER — Encounter (HOSPITAL_COMMUNITY)
Admission: RE | Admit: 2011-05-08 | Discharge: 2011-05-08 | Disposition: A | Discharge status: Home / Self Care | Payer: 59 | Source: Ambulatory Visit | Attending: Cardiology | Admitting: Cardiology

## 2011-05-10 ENCOUNTER — Telehealth: Payer: Self-pay | Admitting: Cardiology

## 2011-05-10 ENCOUNTER — Encounter (HOSPITAL_COMMUNITY)
Admission: RE | Admit: 2011-05-10 | Discharge: 2011-05-10 | Disposition: A | Discharge status: Home / Self Care | Payer: 59 | Source: Ambulatory Visit | Attending: Cardiology | Admitting: Cardiology

## 2011-05-10 NOTE — Telephone Encounter (Signed)
Patient needs letter to return to work.  tg

## 2011-05-10 NOTE — Telephone Encounter (Signed)
**Note De-Identified Trevor Key Obfuscation** Per Hart Rochester, at [facility] cardiac rehab, pt. finished cardiac rehab this morning. Pt. wants a return to work letter, please advise./LV

## 2011-05-11 NOTE — Telephone Encounter (Signed)
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 Frankly, this final decision is up to his DOT physical, which I hope he has already arranged. From my perspective he should be able to go back to work, continuing on medical therapy, and with regular followup.

## 2011-05-11 NOTE — Telephone Encounter (Signed)
**Note De-Identified Trevor Key Obfuscation** Pt. advised that he may come by office to pick up return to work letter. He verbalized understanding and stated he would pick up letter today./LV

## 2011-05-12 ENCOUNTER — Encounter (HOSPITAL_COMMUNITY): Payer: 59

## 2011-05-12 NOTE — Progress Notes (Signed)
Cardiac Rehabilitation Program Outcomes Report   Orientation:  02/07/2011 Graduate Date:  tbd Discharge Date:  tbd # of sessions completed: 18 DX: Myocardial infarction and Stent X1  Cardiologist: Nona Dell Family MD: Ross Marcus Time:  08:15  A.  Exercise Program:  Tolerates exercise @ 2.8 METS for 15 minutes  B.  Mental Health:  Good mental attitude  C.  Education/Instruction/Skills  Knows THR for exercise and Uses Perceived Exertion Scale and/or Dyspnea Scale  Uses Perceived Exertion Scale and/or Dyspnea Scale  D.  Nutrition/Weight Control/Body Composition:  Adherence to prescribed nutrition program: good    E.  Blood Lipids    Lab Results  Component Value Date   CHOL 118 03/27/2011   HDL 33* 03/27/2011   LDLCALC 66 03/27/2011   TRIG 96 03/27/2011   CHOLHDL 3.6 03/27/2011    F.  Lifestyle Changes:  Making positive lifestyle changes  G.  Symptoms noted with exercise:  Asymptomatic  Report Completed By:  Lelon Huh.Ayodeji Keimig RN   Comments:  This is patients halfway report. He achieved a peak mets of 2.8. His resting HR is 58 and his resting BP is 115/70. His peak HR is 78 and his peak BP is 130/82. A report will follow at graduation.

## 2011-05-12 NOTE — Progress Notes (Signed)
Cardiac Rehabilitation Program Outcomes Report   Orientation:  02-07-2011 Graduate Date:  tbd Discharge Date:  **tbd* # of sessions completed: 3 DX: Stent X1/ Myocardial Infarction  Cardiologist: Nona Dell Family MD:  Ross Marcus Time:  08:15  A.  Exercise Program:  Tolerates exercise @ 2.5 METS for 15 minutes  B.  Mental Health:  Good mental attitude  C.  Education/Instruction/Skills  Knows THR for exercise and Uses Perceived Exertion Scale and/or Dyspnea Scale  Uses Perceived Exertion Scale and/or Dyspnea Scale  D.  Nutrition/Weight Control/Body Composition:  Adherence to prescribed nutrition program: good    E.  Blood Lipids    Lab Results  Component Value Date   CHOL 118 03/27/2011   HDL 33* 03/27/2011   LDLCALC 66 03/27/2011   TRIG 96 03/27/2011   CHOLHDL 3.6 03/27/2011    F.  Lifestyle Changes:  Making positive lifestyle changes  G.  Symptoms noted with exercise:  Asymptomatic  Report Completed By:  Lelon Huh. Johnelle Tafolla RN   Comments:  This is patients 1 st week report. He achieved a peak mets of 2.5. His resting HR was 55 and resting BP was 118/70. His peak HR is 89 and his peak BP is 130/60. A half way report will follow at his 18 th visit.

## 2011-05-12 NOTE — Progress Notes (Signed)
Cardiac Rehabilitation Program Outcomes Report   Orientation:  02/07/2011 Graduate Date:  05/10/2011 Discharge Date:  05/10/2011 # of sessions completed: 34 DX: Stent X1 and myocardial infarction  Cardiologist: Hermina Staggers MD:  Ross Marcus Time:  08:15  A.  Exercise Program:  Tolerates exercise @ 3.0 METS for 15 minutes and Discharged to home exercise program.  Anticipated compliance:  excellent  B.  Mental Health:  Good mental attitude  C.  Education/Instruction/Skills  Knows THR for exercise, Uses Perceived Exertion Scale and/or Dyspnea Scale and Attended all education classes  Uses Perceived Exertion Scale and/or Dyspnea Scale  D.  Nutrition/Weight Control/Body Composition:  Adherence to prescribed nutrition program: good    E.  Blood Lipids    Lab Results  Component Value Date   CHOL 118 03/27/2011   HDL 33* 03/27/2011   LDLCALC 66 03/27/2011   TRIG 96 03/27/2011   CHOLHDL 3.6 03/27/2011    F.  Lifestyle Changes:  Making positive lifestyle changes  G.  Symptoms noted with exercise:  Asymptomatic  Report Completed By:   Lelon Huh. Osric Klopf RN   Comments:  Mr Christopoulos has progressed nicely to 30 min of aerobic exercise@Max  Met Level of 3.0 and 10 minutes strength and flexibility exercises. All patients vitals are WNL. Patient has not met with dietician. Discharge Instructions were gone over in detail and Patient Verbalized Understanding. 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 Cardiac rehab staff will contact pt. At 1 month and 6 months and 1 year For follow up. Patient had no pain or abnormal S/S while in rehab.

## 2011-05-15 ENCOUNTER — Encounter (HOSPITAL_COMMUNITY): Payer: 59

## 2011-05-17 ENCOUNTER — Encounter (HOSPITAL_COMMUNITY): Payer: 59

## 2011-05-19 ENCOUNTER — Encounter (HOSPITAL_COMMUNITY): Payer: 59

## 2011-06-15 ENCOUNTER — Encounter: Payer: Self-pay | Admitting: Cardiology

## 2011-07-03 ENCOUNTER — Encounter: Payer: Self-pay | Admitting: Adult Health

## 2011-07-03 ENCOUNTER — Ambulatory Visit (INDEPENDENT_AMBULATORY_CARE_PROVIDER_SITE_OTHER): Payer: 59 | Admitting: Adult Health

## 2011-07-03 VITALS — BP 150/90 | HR 62 | Ht 62.0 in | Wt 150.0 lb

## 2011-07-03 DIAGNOSIS — R079 Chest pain, unspecified: Secondary | ICD-10-CM

## 2011-07-03 DIAGNOSIS — I2510 Atherosclerotic heart disease of native coronary artery without angina pectoris: Secondary | ICD-10-CM

## 2011-07-03 DIAGNOSIS — I10 Essential (primary) hypertension: Secondary | ICD-10-CM

## 2011-07-03 MED ORDER — METOPROLOL SUCCINATE ER 50 MG PO TB24: 50.0000 mg | ORAL_TABLET | Freq: Every day | ORAL | Status: DC

## 2011-07-03 NOTE — Progress Notes (Signed)
   HPI: Mr. Goines is a 56 y/o patient of Dr. Diona Browner we are following ofr ongoing assessment and treatment of hypertension, CAD with BMS to LAD in 1/13 in the setting of MI with preserved EF of 55%-60%. He comes today without complaint except fatigue. He  thinks his medications are causing this. 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 He denies chest pain or dizziness. He works strange hours, sleeping in his truck for 3-4 hours in the late afternoon before driving all night. He states he feels he is getting enough sleep.  No Known Allergies  Current Outpatient Prescriptions  Medication Sig Dispense Refill  . aspirin 325 MG tablet Take 325 mg by mouth daily.      Marland Kitchen atorvastatin (LIPITOR) 40 MG tablet Take 40 mg by mouth daily.      . cloNIDine (CATAPRES) 0.1 MG tablet Take 0.1 mg by mouth 2 (two) times daily.      . clopidogrel (PLAVIX) 75 MG tablet Take 75 mg by mouth daily.      Marland Kitchen losartan (COZAAR) 50 MG tablet Take 1 tablet (50 mg total) by mouth daily.  90 tablet  1  . nitroGLYCERIN (NITROLINGUAL) 0.4 MG/SPRAY spray Place 1 spray under the tongue every 5 (five) minutes as needed.      . metoprolol succinate (TOPROL-XL) 50 MG 24 hr tablet Take 1 tablet (50 mg total) by mouth daily. Take with or immediately following a meal.  90 tablet  3    Past Medical History  Diagnosis Date  . Coronary atherosclerosis of native coronary artery     BMS LAD 1/13, LVEF 55-60%  . MI (myocardial infarction)     AMI 1/13    Past Surgical History  Procedure Date  . Coronary stent placement     WUJ:WJXBJY of systems complete and found to be negative unless listed above  PHYSICAL EXAM BP 150/90  Pulse 62  Ht 5\' 2"  (1.575 m)  Wt 150 lb (68.04 kg)  BMI 27.44 kg/m2  General: Well developed, well nourished, in no acute distress Head: Eyes PERRLA, No xanthomas.   Normal cephalic and atramatic  Lungs: Clear bilaterally to  auscultation and percussion. Heart: HRRR S1 S2, without MRG.  Pulses are 2+ & equal.            No carotid bruit. No JVD.  No abdominal bruits. No femoral bruits. Abdomen: Bowel sounds are positive, abdomen soft and non-tender without masses or                  Hernia's noted. Msk:  Back normal, normal gait. Normal strength and tone for age. Extremities: No clubbing, cyanosis or edema.  DP +1 Neuro: Alert and oriented X 3. Psych:  Good affect, responds appropriately  EKG:SR rate of 62 bpm.  ASSESSMENT AND PLAN

## 2011-07-03 NOTE — Patient Instructions (Addendum)
Your physician recommends that you schedule a follow-up appointment in: 1 month  Your physician has recommended you make the following change in your medication:  1 - CHANGE metoprolol to Toprol XL 50 mg daily

## 2011-07-03 NOTE — Assessment & Plan Note (Signed)
BP is moderately controlled. Clonidine may also be contributing to his fatigue and sleepiness.  May consider taking him off of this and changing to amlodipine or ARB with diuretic for BP control. Will need to wean him from this slowly. Will reassess this on next visit.

## 2011-07-03 NOTE — Assessment & Plan Note (Signed)
He could be experiencing his fatigue from BB which he is taking BID. I will change this to metoprolol XL once daily, which he is to take at his "normal HS" time. He is not experiencing chest pain, EKG is NSR in the low 60's. May decrease BB if HR becomes too low. Will follow this.

## 2011-07-11 ENCOUNTER — Other Ambulatory Visit: Payer: Self-pay | Admitting: Adult Health

## 2011-07-12 ENCOUNTER — Other Ambulatory Visit: Payer: Self-pay | Admitting: *Deleted

## 2011-07-12 MED ORDER — CLOPIDOGREL BISULFATE 75 MG PO TABS: 75.0000 mg | ORAL_TABLET | Freq: Every day | ORAL | Status: DC

## 2011-07-12 MED ORDER — ASPIRIN 325 MG PO TABS: 325.0000 mg | ORAL_TABLET | Freq: Every day | ORAL | Status: DC

## 2011-07-12 MED ORDER — ATORVASTATIN CALCIUM 40 MG PO TABS: 40.0000 mg | ORAL_TABLET | Freq: Every day | ORAL | Status: DC

## 2011-07-12 MED ORDER — CLONIDINE HCL 0.1 MG PO TABS: 0.1000 mg | ORAL_TABLET | Freq: Two times a day (BID) | ORAL | Status: DC

## 2011-07-14 MED ORDER — LOSARTAN POTASSIUM 50 MG PO TABS: 50.0000 mg | ORAL_TABLET | Freq: Every day | ORAL | Status: DC

## 2011-08-07 ENCOUNTER — Ambulatory Visit: Payer: 59 | Admitting: Cardiology

## 2011-08-10 ENCOUNTER — Encounter: Payer: Self-pay | Admitting: Cardiology

## 2011-08-10 ENCOUNTER — Ambulatory Visit (INDEPENDENT_AMBULATORY_CARE_PROVIDER_SITE_OTHER): Payer: 59 | Admitting: Cardiology

## 2011-08-10 ENCOUNTER — Encounter: Payer: Self-pay | Admitting: *Deleted

## 2011-08-10 VITALS — BP 126/77 | HR 59 | Ht 62.0 in | Wt 150.0 lb

## 2011-08-10 DIAGNOSIS — I10 Essential (primary) hypertension: Secondary | ICD-10-CM

## 2011-08-10 DIAGNOSIS — F17200 Nicotine dependence, unspecified, uncomplicated: Secondary | ICD-10-CM

## 2011-08-10 DIAGNOSIS — I2510 Atherosclerotic heart disease of native coronary artery without angina pectoris: Secondary | ICD-10-CM

## 2011-08-10 DIAGNOSIS — E782 Mixed hyperlipidemia: Secondary | ICD-10-CM

## 2011-08-10 NOTE — Patient Instructions (Addendum)
Your physician recommends that you schedule a follow-up appointment in: 6 months  Your physician recommends that you return for lab work in: In 6 months prior to your follow up vivit  Your physician has recommended you make the following change in your medication:  1 - DECREASE Aspirin to 81 mg daily

## 2011-08-10 NOTE — Assessment & Plan Note (Signed)
Stable on medical therapy. He reports compliance with his medications. Will decrease ASA to 81 mg daily and keep others in place. Followup arranged in 6 months.

## 2011-08-10 NOTE — Progress Notes (Signed)
   Clinical Summary Trevor Key is a 56 y.o.male presenting for followup. He was seen in July most recently. He seems to be doing quite well. 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 No angina or unusual dyspnea.   He reports compliance with his medications and has done well with the changes made at the last visit. Less fatigue.   He has had some nose bleeds. No other major bleeding problems.  He is still working on smoking cessation, almost ready to quit.   No Known Allergies  Current Outpatient Prescriptions  Medication Sig Dispense Refill  . aspirin 81 MG tablet Take 81 mg by mouth daily.      Marland Kitchen atorvastatin (LIPITOR) 40 MG tablet Take 1 tablet (40 mg total) by mouth daily.  30 tablet  6  . cloNIDine (CATAPRES) 0.1 MG tablet Take 1 tablet (0.1 mg total) by mouth 2 (two) times daily.  60 tablet  6  . clopidogrel (PLAVIX) 75 MG tablet Take 1 tablet (75 mg total) by mouth daily.  30 tablet  6  . losartan (COZAAR) 50 MG tablet Take 1 tablet (50 mg total) by mouth daily.  90 tablet  1  . metoprolol succinate (TOPROL-XL) 50 MG 24 hr tablet Take 1 tablet (50 mg total) by mouth daily. Take with or immediately following a meal.  90 tablet  3  . nitroGLYCERIN (NITROLINGUAL) 0.4 MG/SPRAY spray Place 1 spray under the tongue every 5 (five) minutes as needed.        Past Medical History  Diagnosis Date  . Coronary atherosclerosis of native coronary artery     BMS LAD 1/13, LVEF 55-60%  . MI (myocardial infarction)     AMI 1/13    Social History Trevor Key reports that he has been smoking Cigarettes.  He has never used smokeless tobacco. Trevor Key reports that he does not drink alcohol.  Review of Systems No palpitations or syncope. Otherwise negative.  Physical Examination Filed Vitals:   08/10/11 1434  BP: 126/77  Pulse: 59   Normally nourished appearing male in NAD.  HEENT: Conjunctiva and lids normal, oropharynx clear.  Neck: Supple, no elevated JVP or carotid bruits, no  thyromegaly.  Lungs: Clear to auscultation, nonlabored breathing at rest.  Cardiac: Regular rate and rhythm, no S3 or significant systolic murmur, no pericardial rub.  Abdomen: Soft, nontender, bowel sounds present, no guarding or rebound.  Extremities: No pitting edema, distal pulses 2+.  Skin: Warm and dry.  Musculoskeletal: No kyphosis.  Neuropsychiatric: Alert and oriented x3, affect grossly appropriate.     Problem List and Plan   Coronary atherosclerosis of native coronary artery Stable on medical therapy. He reports compliance with his medications. Will decrease ASA to 81 mg daily and keep others in place. Followup arranged in 6 months.  Hypertension Blood pressure is reasonable today.  Tobacco use disorder Has cut back and is almost ready to quit.  Mixed hyperlipidemia Continue statin and check LFT and FLP for next visit.    Jonelle Sidle, M.D., F.A.C.C.

## 2011-08-10 NOTE — Assessment & Plan Note (Signed)
Blood pressure is reasonable today. 

## 2011-08-10 NOTE — Assessment & Plan Note (Signed)
Has cut back and is almost ready to quit.

## 2011-08-10 NOTE — Assessment & Plan Note (Signed)
Continue statin and check LFT and FLP for next visit.

## 2011-09-19 ENCOUNTER — Telehealth: Payer: Self-pay | Admitting: Cardiology

## 2011-09-19 NOTE — Telephone Encounter (Signed)
Please advise 

## 2011-09-19 NOTE — Telephone Encounter (Signed)
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 From a cardiac perspective, he has been quite stable on medical therapy.  Jonelle Sidle, M.D., F.A.C.C.

## 2011-09-19 NOTE — Telephone Encounter (Signed)
Patient states that he needs letter to return to work and it has to state that he has no restrictions. / tg

## 2011-09-20 NOTE — Telephone Encounter (Signed)
Attempted to contact patient. No answer at this time. 

## 2011-09-22 NOTE — Telephone Encounter (Signed)
Patient states that [employer] is requesting that he present documentation from our office that he may work without restrictions.

## 2011-09-24 NOTE — Telephone Encounter (Signed)
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 He has been doing well on medical therapy and completed cardiac rehabilitation.

## 2011-09-25 NOTE — Telephone Encounter (Signed)
Patient made aware on Friday the 20th

## 2011-10-03 ENCOUNTER — Encounter: Payer: Self-pay | Admitting: Internal Medicine

## 2011-10-09 ENCOUNTER — Ambulatory Visit: Payer: Self-pay

## 2011-10-09 ENCOUNTER — Encounter: Payer: Self-pay | Admitting: Internal Medicine

## 2011-10-09 LAB — DOT URINE DIP

## 2011-10-10 ENCOUNTER — Ambulatory Visit (INDEPENDENT_AMBULATORY_CARE_PROVIDER_SITE_OTHER): Payer: 59 | Admitting: Gastroenterology

## 2011-10-10 ENCOUNTER — Other Ambulatory Visit: Payer: Self-pay | Admitting: Internal Medicine

## 2011-10-10 ENCOUNTER — Encounter: Payer: Self-pay | Admitting: Gastroenterology

## 2011-10-10 VITALS — BP 142/94 | HR 80 | Temp 98.1°F | Ht 62.0 in | Wt 152.4 lb

## 2011-10-10 DIAGNOSIS — Z86010 Personal history of colonic polyps: Secondary | ICD-10-CM | POA: Insufficient documentation

## 2011-10-10 DIAGNOSIS — D126 Benign neoplasm of colon, unspecified: Secondary | ICD-10-CM

## 2011-10-10 MED ORDER — PEG 3350-KCL-NA BICARB-NACL 420 G PO SOLR: 4000.0000 mL | ORAL | Status: DC

## 2011-10-10 NOTE — Progress Notes (Signed)
Faxed to PCP

## 2011-10-10 NOTE — Progress Notes (Signed)
Primary Care Physician:  FANTA,TESFAYE, MD  Primary Gastroenterologist:  Michael Rourk, MD   Chief Complaint  Patient presents with  . knot on neck    HPI:  Trevor Key is a 56 y.o. male here for h/o colon adenomas in 2008. He is due surveillance colonoscopy at this time. No abdominal pain, constipation, diarrhea, melena, brbpr, weight loss. No heartburn, dysphagia, vomiting. Worried about knot on right side of neck. Has been there for over 20 years but growing and getting in the way. Nicks with razor while shaving. 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 He is on ASA/Plavix.  Current Outpatient Prescriptions  Medication Sig Dispense Refill  . aspirin 325 MG tablet Take 325 mg by mouth daily.      . atorvastatin (LIPITOR) 40 MG tablet Take 1 tablet (40 mg total) by mouth daily.  30 tablet  6  . cloNIDine (CATAPRES) 0.1 MG tablet Take 1 tablet (0.1 mg total) by mouth 2 (two) times daily.  60 tablet  6  . clopidogrel (PLAVIX) 75 MG tablet Take 1 tablet (75 mg total) by mouth daily.  30 tablet  6  . losartan (COZAAR) 50 MG tablet Take 1 tablet (50 mg total) by mouth daily.  90 tablet  1  . metoprolol succinate (TOPROL-XL) 50 MG 24 hr tablet Take 1 tablet (50 mg total) by mouth daily. Take with or immediately following a meal.  90 tablet  3  . nitroGLYCERIN (NITROLINGUAL) 0.4 MG/SPRAY spray Place 1 spray under the tongue every 5 (five) minutes as needed.        Allergies as of 10/10/2011  . (No Known Allergies)    Past Medical History  Diagnosis Date  . Coronary atherosclerosis of native coronary artery     BMS LAD 1/13, LVEF 55-60%  . MI (myocardial infarction)     AMI 1/13    Past Surgical History  Procedure Date  . Coronary stent placement   . Colonoscopy 11/14/2006    Mottled around pedunculated polyp suspicious for carcinoid removed   cleanly with the hot snare likely represents cause of the patient's  hematochezia/  5 mm  middescending and ascending colon polyps removed with cold snare technique, right sided diverticula.  Remainder of colonic mucosa appeared normal. Rectum path unremarkable. Desc colon polyp adenomatous. Next TCS 11/2011.     Family History  Problem Relation Age of Onset  . Diabetes type II Father   . Coronary artery disease Father   . Heart attack Father   . Diabetes type II Mother   . Breast cancer Mother   . Colon cancer Neg Hx     History   Social History  . Marital Status: Married    Spouse Name: N/A    Number of Children: 3  . Years of Education: N/A   Occupational History  . Truck driver   .     Social History Main Topics  . Smoking status: Current Every Day Smoker -- 0.5 packs/day    Types: Cigarettes  . Smokeless tobacco: Never Used  . Alcohol Use: No  . Drug Use: No  . Sexually Active: Not on file   Other Topics Concern  . Not on file   Social History Narrative  . No narrative on file      ROS:  General: Negative for anorexia, weight loss, fever, chills, fatigue, weakness. Eyes: Negative for vision changes.  ENT: Negative for hoarseness, difficulty swallowing ,   nasal congestion.c/o enlarging growth on right neck. Present for over 20 years. CV: Negative for chest pain, angina, palpitations, dyspnea on exertion, peripheral edema.  Respiratory: Negative for dyspnea at rest, dyspnea on exertion, cough, sputum, wheezing.  GI: See history of present illness. GU:  Negative for dysuria, hematuria, urinary incontinence, urinary frequency, nocturnal urination.  MS: Negative for joint pain, low back pain.  Derm: Negative for rash or itching.  Neuro: Negative for weakness, abnormal sensation, seizure, frequent headaches, memory loss, confusion.  Psych: Negative for anxiety, depression, suicidal ideation, hallucinations.  Endo: Negative for unusual weight change.  Heme: Negative for bruising or bleeding. Allergy: Negative for rash or hives.    Physical  Examination:  BP 142/94  Pulse 80  Temp 98.1 F (36.7 C) (Temporal)  Ht 5' 2" (1.575 m)  Wt 152 lb 6.4 oz (69.128 kg)  BMI 27.87 kg/m2   General: Well-nourished, well-developed in no acute distress.  Head: Normocephalic, atraumatic.   Eyes: Conjunctiva pink, no icterus. Mouth: Oropharyngeal mucosa moist and pink , no lesions erythema or exudate. Neck: Supple without thyromegaly, Grape sized mobile nontender growth on right neck.  Lungs: Clear to auscultation bilaterally.  Heart: Regular rate and rhythm, no murmurs rubs or gallops.  Abdomen: Bowel sounds are normal, nontender, nondistended, no hepatosplenomegaly or masses, no abdominal bruits or    hernia , no rebound or guarding.   Rectal: not performed Extremities: No lower extremity edema. No clubbing or deformities.  Neuro: Alert and oriented x 4 , grossly normal neurologically.  Skin: Warm and dry, no rash or jaundice.   Psych: Alert and cooperative, normal mood and affect.   Imaging Studies: No results found.    

## 2011-10-10 NOTE — Assessment & Plan Note (Signed)
H/O adenomatous colon polyp removed in 2008. Due for surveillance colonoscopy. Patient denies any GI symptoms. Had MI in 01/2011, s/p stenting. On Plavix/ASA. Will continue both Plavix/ASA for procedures. Discussed slight increase of bleeding on these agents.  I have discussed the risks, alternatives, benefits with regards to but not limited to the risk of reaction to medication, bleeding, infection, perforation and the patient is agreeable to proceed. Written consent to be obtained.  Patient is concerned about growing knot on right neck that has been present for over 20 years. ?fatty tumor. Advised him to discuss with Dr. Felecia Shelling for surgical referral.

## 2011-10-10 NOTE — Patient Instructions (Addendum)
We have scheduled you for a colonoscopy with Dr. Jena Gauss. Please see separate instructions.  Please call Dr. Letitia Neri office to inquire about referral for knot on your neck.

## 2011-10-11 ENCOUNTER — Encounter (HOSPITAL_COMMUNITY): Payer: Self-pay | Admitting: Pharmacy Technician

## 2011-10-17 MED ORDER — SODIUM CHLORIDE 0.45 % IV SOLN
INTRAVENOUS | Status: DC
  Administered 2011-10-18: 13:00:00 via INTRAVENOUS

## 2011-10-18 ENCOUNTER — Encounter (HOSPITAL_COMMUNITY): Payer: Self-pay | Admitting: *Deleted

## 2011-10-18 ENCOUNTER — Ambulatory Visit (HOSPITAL_COMMUNITY)
Admission: RE | Admit: 2011-10-18 | Discharge: 2011-10-18 | Disposition: A | Discharge status: Home / Self Care | Payer: 59 | Source: Ambulatory Visit | Attending: Internal Medicine | Admitting: Internal Medicine

## 2011-10-18 ENCOUNTER — Encounter (HOSPITAL_COMMUNITY)
Admission: RE | Disposition: A | Discharge status: Home / Self Care | Payer: Self-pay | Source: Ambulatory Visit | Attending: Internal Medicine

## 2011-10-18 DIAGNOSIS — D126 Benign neoplasm of colon, unspecified: Secondary | ICD-10-CM | POA: Insufficient documentation

## 2011-10-18 DIAGNOSIS — K5730 Diverticulosis of large intestine without perforation or abscess without bleeding: Secondary | ICD-10-CM | POA: Insufficient documentation

## 2011-10-18 DIAGNOSIS — Z860100 Personal history of colon polyps, unspecified: Secondary | ICD-10-CM | POA: Insufficient documentation

## 2011-10-18 DIAGNOSIS — Z1211 Encounter for screening for malignant neoplasm of colon: Secondary | ICD-10-CM

## 2011-10-18 HISTORY — DX: Pure hypercholesterolemia, unspecified: E78.00

## 2011-10-18 HISTORY — PX: COLONOSCOPY: SHX5424

## 2011-10-18 SURGERY — COLONOSCOPY
Anesthesia: Moderate Sedation

## 2011-10-18 MED ORDER — MIDAZOLAM HCL 5 MG/5ML IJ SOLN
INTRAMUSCULAR | Status: DC | PRN
  Administered 2011-10-18: 1 mg via INTRAVENOUS
  Administered 2011-10-18: 2 mg via INTRAVENOUS

## 2011-10-18 MED ORDER — MIDAZOLAM HCL 5 MG/5ML IJ SOLN
INTRAMUSCULAR | Status: AC
  Filled 2011-10-18: qty 10

## 2011-10-18 MED ORDER — MEPERIDINE HCL 100 MG/ML IJ SOLN
INTRAMUSCULAR | Status: DC | PRN
  Administered 2011-10-18: 50 mg via INTRAVENOUS

## 2011-10-18 MED ORDER — MEPERIDINE HCL 100 MG/ML IJ SOLN
INTRAMUSCULAR | Status: AC
  Filled 2011-10-18: qty 1

## 2011-10-18 MED ORDER — STERILE WATER FOR IRRIGATION IR SOLN
Status: DC | PRN
  Administered 2011-10-18: 15:00:00

## 2011-10-18 NOTE — Interval H&P Note (Signed)
History and Physical Interval Note:  10/18/2011 3:03 PM  Trevor Key  has presented today for surgery, with the diagnosis of HISTORY OF COLON ADENOMAS  The various methods of treatment have been discussed with the patient and family. After consideration of risks, benefits and other options for treatment, the patient has consented to  Procedure(s) (LRB) with comments: COLONOSCOPY (N/A) - 2:30-changed to 2:45 [name] to notify pt as a surgical intervention .  The patient's history has been reviewed, patient examined, no change in status, stable for surgery.  I have reviewed the patient's chart and labs.  Questions were answered to the patient's satisfaction.     Eula Listen

## 2011-10-18 NOTE — H&P (View-Only) (Signed)
Primary Care Physician:  Avon Gully, MD  Primary Gastroenterologist:  Roetta Sessions, MD   Chief Complaint  Patient presents with  . knot on neck    HPI:  Trevor Key is a 56 y.o. male here for h/o colon adenomas in 2008. He is due surveillance colonoscopy at this time. No abdominal pain, constipation, diarrhea, melena, brbpr, weight loss. No heartburn, dysphagia, vomiting. Worried about knot on right side of neck. Has been there for over 20 years but growing and getting in the way. Nicks with razor while shaving. 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 He is on ASA/Plavix.  Current Outpatient Prescriptions  Medication Sig Dispense Refill  . aspirin 325 MG tablet Take 325 mg by mouth daily.      Marland Kitchen atorvastatin (LIPITOR) 40 MG tablet Take 1 tablet (40 mg total) by mouth daily.  30 tablet  6  . cloNIDine (CATAPRES) 0.1 MG tablet Take 1 tablet (0.1 mg total) by mouth 2 (two) times daily.  60 tablet  6  . clopidogrel (PLAVIX) 75 MG tablet Take 1 tablet (75 mg total) by mouth daily.  30 tablet  6  . losartan (COZAAR) 50 MG tablet Take 1 tablet (50 mg total) by mouth daily.  90 tablet  1  . metoprolol succinate (TOPROL-XL) 50 MG 24 hr tablet Take 1 tablet (50 mg total) by mouth daily. Take with or immediately following a meal.  90 tablet  3  . nitroGLYCERIN (NITROLINGUAL) 0.4 MG/SPRAY spray Place 1 spray under the tongue every 5 (five) minutes as needed.        Allergies as of 10/10/2011  . (No Known Allergies)    Past Medical History  Diagnosis Date  . Coronary atherosclerosis of native coronary artery     BMS LAD 1/13, LVEF 55-60%  . MI (myocardial infarction)     AMI 1/13    Past Surgical History  Procedure Date  . Coronary stent placement   . Colonoscopy 11/14/2006    Mottled around pedunculated polyp suspicious for carcinoid removed   cleanly with the hot snare likely represents cause of the patient's  hematochezia/  5 mm  middescending and ascending colon polyps removed with cold snare technique, right sided diverticula.  Remainder of colonic mucosa appeared normal. Rectum path unremarkable. Desc colon polyp adenomatous. Next TCS 11/2011.     Family History  Problem Relation Age of Onset  . Diabetes type II Father   . Coronary artery disease Father   . Heart attack Father   . Diabetes type II Mother   . Breast cancer Mother   . Colon cancer Neg Hx     History   Social History  . Marital Status: Married    Spouse Name: N/A    Number of Children: 3  . Years of Education: N/A   Occupational History  . Truck driver   .     Social History Main Topics  . Smoking status: Current Every Day Smoker -- 0.5 packs/day    Types: Cigarettes  . Smokeless tobacco: Never Used  . Alcohol Use: No  . Drug Use: No  . Sexually Active: Not on file   Other Topics Concern  . Not on file   Social History Narrative  . No narrative on file      ROS:  General: Negative for anorexia, weight loss, fever, chills, fatigue, weakness. Eyes: Negative for vision changes.  ENT: Negative for hoarseness, difficulty swallowing ,  nasal congestion.c/o enlarging growth on right neck. Present for over 20 years. CV: Negative for chest pain, angina, palpitations, dyspnea on exertion, peripheral edema.  Respiratory: Negative for dyspnea at rest, dyspnea on exertion, cough, sputum, wheezing.  GI: See history of present illness. GU:  Negative for dysuria, hematuria, urinary incontinence, urinary frequency, nocturnal urination.  MS: Negative for joint pain, low back pain.  Derm: Negative for rash or itching.  Neuro: Negative for weakness, abnormal sensation, seizure, frequent headaches, memory loss, confusion.  Psych: Negative for anxiety, depression, suicidal ideation, hallucinations.  Endo: Negative for unusual weight change.  Heme: Negative for bruising or bleeding. Allergy: Negative for rash or hives.    Physical  Examination:  BP 142/94  Pulse 80  Temp 98.1 F (36.7 C) (Temporal)  Ht 5\' 2"  (1.575 m)  Wt 152 lb 6.4 oz (69.128 kg)  BMI 27.87 kg/m2   General: Well-nourished, well-developed in no acute distress.  Head: Normocephalic, atraumatic.   Eyes: Conjunctiva pink, no icterus. Mouth: Oropharyngeal mucosa moist and pink , no lesions erythema or exudate. Neck: Supple without thyromegaly, Grape sized mobile nontender growth on right neck.  Lungs: Clear to auscultation bilaterally.  Heart: Regular rate and rhythm, no murmurs rubs or gallops.  Abdomen: Bowel sounds are normal, nontender, nondistended, no hepatosplenomegaly or masses, no abdominal bruits or    hernia , no rebound or guarding.   Rectal: not performed Extremities: No lower extremity edema. No clubbing or deformities.  Neuro: Alert and oriented x 4 , grossly normal neurologically.  Skin: Warm and dry, no rash or jaundice.   Psych: Alert and cooperative, normal mood and affect.   Imaging Studies: No results found.

## 2011-10-18 NOTE — Op Note (Signed)
[facility] [address] [place], [address]   COLONOSCOPY PROCEDURE REPORT  PATIENT: Trevor, Key  MR#:         [id number] BIRTHDATE: [birth year] , 56  yrs. old GENDER: Male ENDOSCOPIST: R.  Roetta Sessions, MD FACP FACG REFERRED BY:  Glenice Laine, M.D. PROCEDURE DATE:  10/18/2011 PROCEDURE:     colonoscopy with snare polypectomy  INDICATIONS: Surveillance examination; history of colonic polyps  INFORMED CONSENT:  The risks, benefits, alternatives and imponderables including but not limited to bleeding, perforation as well as the possibility of a missed lesion have been reviewed.  The potential for biopsy, lesion removal, etc. have also been discussed.  Questions have been answered.  All parties agreeable. Please see the history and physical in the medical record for more information.  MEDICATIONS: Versed 3 mg IV and Demerol 50 mg in divided doses.  DESCRIPTION OF PROCEDURE:  After a digital rectal exam was performed, the [id number] ([id number])  colonoscope was advanced from the anus through the rectum and colon to the area of the cecum, ileocecal valve and appendiceal orifice.  The cecum was deeply intubated.  These structures were well-seen and photographed for the record.  From the level of the cecum and ileocecal valve, the scope was slowly and cautiously withdrawn.  The mucosal surfaces were carefully surveyed utilizing scope tip deflection to facilitate fold flattening as needed.  The scope was pulled down into the rectum where a thorough examination including retroflexion was performed.    FINDINGS:  marginal to poor preparation compromised examination. Anal papilla; otherwise, rectal mucosa appeared normal. Scattered left-sided diverticula. Patient had a single 9 mm sessile polyp at the splenic flexure; otherwise, the remainder of colonic mucosa appeared grossly normal,however,the poor prep tthwarted the examination.  THERAPEUTIC / DIAGNOSTIC  MANEUVERS PERFORMED:  The above-mentioned polyp was hot snare removed  COMPLICATIONS: none  CECAL WITHDRAWAL TIME:  16 minutes  IMPRESSION:  Colonic diverticulosis. Colonic polyp as removed as described above  RECOMMENDATIONS: Followup on pathology.  _______________________________ eSigned:  R. Roetta Sessions, MD FACP [facility] 10/18/2011 3:44 PM   CC:    PATIENT NAME:  Trevor, Key MR#: [id number]

## 2011-10-21 ENCOUNTER — Encounter: Payer: Self-pay | Admitting: Internal Medicine

## 2011-10-23 ENCOUNTER — Encounter: Payer: Self-pay | Admitting: *Deleted

## 2011-10-25 ENCOUNTER — Telehealth: Payer: Self-pay | Admitting: *Deleted

## 2011-10-25 ENCOUNTER — Encounter (HOSPITAL_COMMUNITY): Payer: Self-pay | Admitting: Internal Medicine

## 2011-10-25 NOTE — Telephone Encounter (Signed)
Patient had 9mm sessile polyp at splenic flexure hot snared on 10/18/11. He is on Plavix and ASA with coronary stent placement 01/2011.  I spoke to patient personally. He states he had 4 episodes of moderate volume brbpr on Sunday. 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 Currently in [place]. Last BM was Sunday. Monday, Tues, Wednesday, he started having bright red blood in his urine along with dysuria. He called his PCP today and they advised he call us.  I suspect he had rectal bleeding from polypectomy site in setting of Plavix and ASA. I paged Dr. Jena Gauss. He recommends continue Plavix and ASA for now. Monitor for recurrent bleeding. If he has significant bleeding/persistent bleeding, he needs to get to nearest ER.  I explained above to patient. I also advised him he should f/u with PCP regarding dysuria and hematuria which is not likely related to his colonoscopy. I recommended patient consider going to nearest ER but he wants to try and make it back home instead but stated he would if symptoms worse, fever, etc. I requested he call his PCP back as well.  Raynelle Fanning please touch base with patient Friday AM to see how things are going. If he is better with respect to GI bleeding, we can cancel his Monday appointment.   Patient was advised to call us with any concerns. He was very appreciative of my phone call to him.

## 2011-10-25 NOTE — Telephone Encounter (Signed)
Mr Levene called today. He is still concerned about the blood in his stool and urine that is still occuring. Please call him back. Thanks.

## 2011-10-25 NOTE — Telephone Encounter (Signed)
Spoke with Trevor Key- he stated his problems started Sunday- he noticed a lot of blood in the water after a BM, after that the blood was mixed in his stool. No pain, no other GI problems. He has also noticed blood in his urine. He called his PCP and was told he needed to call us first. I advised Trevor Key that this was a new problem and we needed to get him in to be seen. 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 [name] made Trevor Key appointment for Monday.

## 2011-10-26 ENCOUNTER — Encounter: Payer: Self-pay | Admitting: Internal Medicine

## 2011-10-27 NOTE — Telephone Encounter (Signed)
Pt called this am. He is feeling much better, he stated he has not noticed any blood and he was feeling good. He still wants to come in Monday for his appt.

## 2011-10-28 ENCOUNTER — Emergency Department (HOSPITAL_COMMUNITY)
Admission: EM | Admit: 2011-10-28 | Discharge: 2011-10-28 | Disposition: A | Discharge status: Home / Self Care | Payer: 59 | Attending: Emergency Medicine | Admitting: Emergency Medicine

## 2011-10-28 ENCOUNTER — Emergency Department (HOSPITAL_COMMUNITY)
Admission: EM | Admit: 2011-10-28 | Discharge: 2011-10-28 | Disposition: A | Discharge status: Other Acute Inpatient Hospital | Payer: 59 | Source: Home / Self Care | Attending: Family Medicine | Admitting: Family Medicine

## 2011-10-28 ENCOUNTER — Encounter (HOSPITAL_COMMUNITY): Payer: Self-pay | Admitting: Physical Medicine and Rehabilitation

## 2011-10-28 ENCOUNTER — Encounter (HOSPITAL_COMMUNITY): Payer: Self-pay

## 2011-10-28 ENCOUNTER — Emergency Department (HOSPITAL_COMMUNITY): Payer: 59

## 2011-10-28 DIAGNOSIS — N419 Inflammatory disease of prostate, unspecified: Secondary | ICD-10-CM | POA: Insufficient documentation

## 2011-10-28 DIAGNOSIS — Z79899 Other long term (current) drug therapy: Secondary | ICD-10-CM | POA: Insufficient documentation

## 2011-10-28 DIAGNOSIS — R1032 Left lower quadrant pain: Secondary | ICD-10-CM

## 2011-10-28 DIAGNOSIS — R319 Hematuria, unspecified: Secondary | ICD-10-CM | POA: Insufficient documentation

## 2011-10-28 DIAGNOSIS — E7800 Pure hypercholesterolemia, unspecified: Secondary | ICD-10-CM | POA: Insufficient documentation

## 2011-10-28 DIAGNOSIS — Z7982 Long term (current) use of aspirin: Secondary | ICD-10-CM | POA: Insufficient documentation

## 2011-10-28 DIAGNOSIS — N390 Urinary tract infection, site not specified: Secondary | ICD-10-CM

## 2011-10-28 DIAGNOSIS — I2510 Atherosclerotic heart disease of native coronary artery without angina pectoris: Secondary | ICD-10-CM | POA: Insufficient documentation

## 2011-10-28 DIAGNOSIS — I252 Old myocardial infarction: Secondary | ICD-10-CM | POA: Insufficient documentation

## 2011-10-28 DIAGNOSIS — F17200 Nicotine dependence, unspecified, uncomplicated: Secondary | ICD-10-CM | POA: Insufficient documentation

## 2011-10-28 LAB — POCT URINALYSIS DIP (DEVICE)
Bilirubin Urine: NEGATIVE
Glucose, UA: NEGATIVE mg/dL
Ketones, ur: NEGATIVE mg/dL
Nitrite: POSITIVE — AB

## 2011-10-28 LAB — CBC WITH DIFFERENTIAL/PLATELET
Basophils Absolute: 0 10*3/uL (ref 0.0–0.1)
Basophils Absolute: 0.1 10*3/uL (ref 0.0–0.1)
Basophils Relative: 0 % (ref 0–1)
Eosinophils Absolute: 0.2 10*3/uL (ref 0.0–0.7)
Eosinophils Relative: 1 % (ref 0–5)
HCT: 44.1 % (ref 39.0–52.0)
HCT: 38.9 % — ABNORMAL LOW (ref 39.0–52.0)
Hemoglobin: 15.3 g/dL (ref 13.0–17.0)
Lymphocytes Relative: 19 % (ref 12–46)
Lymphocytes Relative: 19 % (ref 12–46)
MCH: 29 pg (ref 26.0–34.0)
MCHC: 33.7 g/dL (ref 30.0–36.0)
MCV: 86.1 fL (ref 78.0–100.0)
Monocytes Absolute: 1.8 10*3/uL — ABNORMAL HIGH (ref 0.1–1.0)
Monocytes Absolute: 1.5 10*3/uL — ABNORMAL HIGH (ref 0.1–1.0)
Monocytes Relative: 11 % (ref 3–12)
Neutro Abs: 11.2 10*3/uL — ABNORMAL HIGH (ref 1.7–7.7)
Neutrophils Relative %: 69 % (ref 43–77)
Platelets: 385 10*3/uL (ref 150–400)
RDW: 13.3 % (ref 11.5–15.5)
WBC: 16.3 10*3/uL — ABNORMAL HIGH (ref 4.0–10.5)
WBC: 13.9 10*3/uL — ABNORMAL HIGH (ref 4.0–10.5)

## 2011-10-28 LAB — BASIC METABOLIC PANEL
Calcium: 9.1 mg/dL (ref 8.4–10.5)
Creatinine, Ser: 1.12 mg/dL (ref 0.50–1.35)
GFR calc Af Amer: 83 mL/min — ABNORMAL LOW (ref >90)
GFR calc non Af Amer: 72 mL/min — ABNORMAL LOW (ref >90)
Sodium: 138 mEq/L (ref 135–145)

## 2011-10-28 LAB — POCT I-STAT, CHEM 8
BUN: 12 mg/dL (ref 6–23)
Calcium, Ion: 1.21 mmol/L (ref 1.12–1.23)
Glucose, Bld: 83 mg/dL (ref 70–99)
TCO2: 26 mmol/L (ref 0–100)

## 2011-10-28 MED ORDER — HYDROMORPHONE HCL PF 1 MG/ML IJ SOLN
0.5000 mg | Freq: Once | INTRAMUSCULAR | Status: AC
  Administered 2011-10-28: 0.5 mg via INTRAVENOUS
  Filled 2011-10-28: qty 1

## 2011-10-28 MED ORDER — IOHEXOL 300 MG/ML  SOLN
20.0000 mL | INTRAMUSCULAR | Status: AC
  Administered 2011-10-28: 20 mL via ORAL

## 2011-10-28 MED ORDER — IOHEXOL 300 MG/ML  SOLN
100.0000 mL | Freq: Once | INTRAMUSCULAR | Status: AC | PRN
  Administered 2011-10-28: 100 mL via INTRAVENOUS

## 2011-10-28 MED ORDER — CIPROFLOXACIN HCL 500 MG PO TABS: 500.0000 mg | ORAL_TABLET | Freq: Two times a day (BID) | ORAL | Status: DC

## 2011-10-28 MED ORDER — HYDROCODONE-ACETAMINOPHEN 5-500 MG PO TABS: 1.0000 | ORAL_TABLET | Freq: Three times a day (TID) | ORAL | Status: DC | PRN

## 2011-10-28 MED ORDER — CEFTRIAXONE SODIUM 1 G IJ SOLR
1.0000 g | Freq: Once | INTRAMUSCULAR | Status: AC
  Administered 2011-10-28: 1 g via INTRAMUSCULAR

## 2011-10-28 MED ORDER — SODIUM CHLORIDE 0.9 % IV SOLN: INTRAVENOUS | Status: DC

## 2011-10-28 MED ORDER — CEFTRIAXONE SODIUM 1 G IJ SOLR
INTRAMUSCULAR | Status: AC
  Filled 2011-10-28: qty 10

## 2011-10-28 MED ORDER — LIDOCAINE HCL (PF) 1 % IJ SOLN
INTRAMUSCULAR | Status: AC
  Filled 2011-10-28: qty 5

## 2011-10-28 NOTE — ED Provider Notes (Addendum)
History     CSN: [id number]  Arrival date & time 10/28/11  1508   First MD Initiated Contact with Patient 10/28/11 [id number]      Chief Complaint  Patient presents with  . Dysuria    (Consider location/radiation/quality/duration/timing/severity/associated sxs/prior treatment) Patient is a 56 y.o. male presenting with dysuria. The history is provided by the patient.  Dysuria    patient here complaining of dysuria hematuria with associated left-sided flank pain x3 days. Went to urgent care Center and had a urinalysis that was positive for infection. He denies any fever, vomiting. Was given a dose of IV Rocephin and sent here for further evaluation. He recent had a colonoscopy done 10 days ago did have some rectal bleeding afterwards which he says has since resolved. A rectal exam done at the urgent care Center prior to arrival according to the notes was guaiac negative.  Past Medical History  Diagnosis Date  . Coronary atherosclerosis of native coronary artery     BMS LAD 1/13, LVEF 55-60%  . MI (myocardial infarction)     AMI 1/13  . Hypercholesteremia     Past Surgical History  Procedure Date  . Coronary stent placement   . Colonoscopy 11/14/2006    Mottled around pedunculated polyp suspicious for carcinoid removed   cleanly with the hot snare likely represents cause of the patient's  hematochezia/  5 mm middescending and ascending colon polyps removed with cold snare technique, right sided diverticula.  Remainder of colonic mucosa appeared normal. Rectum path unremarkable. Desc colon polyp adenomatous. Next TCS 11/2011.   . Cardiac catheterization   . Colonoscopy 10/18/2011    Colonic diverticulosis. Colonic polyp as removed as described above    Family History  Problem Relation Age of Onset  . Diabetes type II Father   . Coronary artery disease Father   . Heart attack Father   . Diabetes type II Mother   . Breast cancer Mother   . Colon cancer Neg Hx     History    Substance Use Topics  . Smoking status: Current Every Day Smoker -- 0.2 packs/day for 40 years    Types: Cigarettes  . Smokeless tobacco: Never Used  . Alcohol Use: No      Review of Systems  Genitourinary: Positive for dysuria.  All other systems reviewed and are negative.    Allergies  Review of patient's allergies indicates no known allergies.  Home Medications   Current Outpatient Rx  Name Route Sig Dispense Refill  . ASPIRIN 81 MG PO TABS Oral Take 81 mg by mouth daily.    . ATORVASTATIN CALCIUM 40 MG PO TABS Oral Take 1 tablet (40 mg total) by mouth daily. 30 tablet 6  . CLONIDINE HCL 0.1 MG PO TABS Oral Take 1 tablet (0.1 mg total) by mouth 2 (two) times daily. 60 tablet 6  . CLOPIDOGREL BISULFATE 75 MG PO TABS Oral Take 1 tablet (75 mg total) by mouth daily. 30 tablet 6  . LOSARTAN POTASSIUM 50 MG PO TABS Oral Take 1 tablet (50 mg total) by mouth daily. 90 tablet 1    Please deliver to [facility]  . METOPROLOL SUCCINATE ER 50 MG PO TB24 Oral Take 1 tablet (50 mg total) by mouth daily. Take with or immediately following a meal. 90 tablet 3  . CIPROFLOXACIN HCL 500 MG PO TABS Oral Take 1 tablet (500 mg total) by mouth 2 (two) times daily. 20 tablet 0  . HYDROCODONE-ACETAMINOPHEN 5-500 MG  PO TABS Oral Take 1 tablet by mouth every 8 (eight) hours as needed for pain. 15 tablet 0  . NITROGLYCERIN 0.4 MG/SPRAY TL SOLN Sublingual Place 1 spray under the tongue every 5 (five) minutes as needed.    Marland Kitchen PEG 3350-KCL-NA BICARB-NACL 420 G PO SOLR Oral Take 4,000 mLs by mouth as directed. 4000 mL 0    BP 152/92  Pulse 86  Temp 98.7 F (37.1 C) (Oral)  Resp 16  SpO2 99%  Physical Exam  Nursing note and vitals reviewed. Constitutional: He is oriented to person, place, and time. He appears well-developed and well-nourished.  Non-toxic appearance. No distress.  HENT:  Head: Normocephalic and atraumatic.  Eyes: Conjunctivae normal, EOM and lids are normal. Pupils are equal,  round, and reactive to light.  Neck: Normal range of motion. Neck supple. No tracheal deviation present. No mass present.  Cardiovascular: Normal rate, regular rhythm and normal heart sounds.  Exam reveals no gallop.   No murmur heard. Pulmonary/Chest: Effort normal and breath sounds normal. No stridor. No respiratory distress. He has no decreased breath sounds. He has no wheezes. He has no rhonchi. He has no rales.  Abdominal: Soft. Normal appearance and bowel sounds are normal. He exhibits no distension. There is no tenderness. There is no rigidity, no rebound, no guarding and no CVA tenderness.  Musculoskeletal: Normal range of motion. He exhibits no edema and no tenderness.  Neurological: He is alert and oriented to person, place, and time. He has normal strength. No cranial nerve deficit or sensory deficit. GCS eye subscore is 4. GCS verbal subscore is 5. GCS motor subscore is 6.  Skin: Skin is warm and dry. No abrasion and no rash noted.  Psychiatric: He has a normal mood and affect. His speech is normal and behavior is normal.    ED Course  Procedures (including critical care time)   Labs Reviewed  CBC WITH DIFFERENTIAL  URINALYSIS, ROUTINE W REFLEX MICROSCOPIC  URINE CULTURE  BASIC METABOLIC PANEL   No results found.   No diagnosis found.    MDM  Pt to have abd ct to r/o pyelo/divertic/kidney stone--to cdu    9:13 PM Pt to be treated for prostatitis.   Toy Baker, MD 10/28/11 1630  Toy Baker, MD 10/28/11 2123

## 2011-10-28 NOTE — ED Provider Notes (Signed)
History     CSN: [id number]  Arrival date & time 10/28/11  1143   First MD Initiated Contact with Patient 10/28/11 1239      Chief Complaint  Patient presents with  . Rectal Bleeding    (Consider location/radiation/quality/duration/timing/severity/associated sxs/prior treatment) HPI Comments: 56 year old male with history of diverticulitis, and recent colonoscopy with polypectomy on October 16. Here complaining of left flank and left lower quadrant intermittent sharp pain that started after he is colonoscopy procedure also has had intermittent rectal bleeding and painful defecation after the procedure. Last time he was able to see blood in his stools was 4 days ago. Patient also complaining of dysuria, tenesmus and hematuria. Does not remember being catheterized recently. Denies nausea vomiting. Denies fever or chills. No headache. Patient is on chronic anticoagulation with Plavix and aspirin and is status post cardiac stenting.   Past Medical History  Diagnosis Date  . Coronary atherosclerosis of native coronary artery     BMS LAD 1/13, LVEF 55-60%  . MI (myocardial infarction)     AMI 1/13  . Hypercholesteremia     Past Surgical History  Procedure Date  . Coronary stent placement   . Colonoscopy 11/14/2006    Mottled around pedunculated polyp suspicious for carcinoid removed   cleanly with the hot snare likely represents cause of the patient's  hematochezia/  5 mm middescending and ascending colon polyps removed with cold snare technique, right sided diverticula.  Remainder of colonic mucosa appeared normal. Rectum path unremarkable. Desc colon polyp adenomatous. Next TCS 11/2011.   . Cardiac catheterization   . Colonoscopy 10/18/2011    Colonic diverticulosis. Colonic polyp as removed as described above    Family History  Problem Relation Age of Onset  . Diabetes type II Father   . Coronary artery disease Father   . Heart attack Father   . Diabetes type II Mother   .  Breast cancer Mother   . Colon cancer Neg Hx     History  Substance Use Topics  . Smoking status: Current Every Day Smoker -- 0.2 packs/day for 40 years    Types: Cigarettes  . Smokeless tobacco: Never Used  . Alcohol Use: No      Review of Systems  Constitutional: Negative for fever and chills.  Respiratory: Negative for shortness of breath.   Cardiovascular: Negative for chest pain and palpitations.  Gastrointestinal: Positive for abdominal pain, blood in stool, hematochezia and anal bleeding. Negative for nausea, vomiting, diarrhea and constipation.  Genitourinary: Positive for dysuria, hematuria and flank pain. Negative for urgency, decreased urine volume, discharge, penile swelling, genital sores, penile pain and testicular pain.  Skin: Negative for rash.  Neurological: Negative for dizziness.    Allergies  Review of patient's allergies indicates no known allergies.  Home Medications   Current Outpatient Rx  Name Route Sig Dispense Refill  . ASPIRIN 325 MG PO TABS Oral Take 325 mg by mouth daily.    . ATORVASTATIN CALCIUM 40 MG PO TABS Oral Take 1 tablet (40 mg total) by mouth daily. 30 tablet 6  . CIPROFLOXACIN HCL 500 MG PO TABS Oral Take 1 tablet (500 mg total) by mouth 2 (two) times daily. 20 tablet 0  . CLONIDINE HCL 0.1 MG PO TABS Oral Take 1 tablet (0.1 mg total) by mouth 2 (two) times daily. 60 tablet 6  . CLOPIDOGREL BISULFATE 75 MG PO TABS Oral Take 1 tablet (75 mg total) by mouth daily. 30 tablet 6  . HYDROCODONE-ACETAMINOPHEN 5-500  MG PO TABS Oral Take 1 tablet by mouth every 8 (eight) hours as needed for pain. 15 tablet 0  . LOSARTAN POTASSIUM 50 MG PO TABS Oral Take 1 tablet (50 mg total) by mouth daily. 90 tablet 1    Please deliver to [facility]  . METOPROLOL SUCCINATE ER 50 MG PO TB24 Oral Take 1 tablet (50 mg total) by mouth daily. Take with or immediately following a meal. 90 tablet 3  . NITROGLYCERIN 0.4 MG/SPRAY TL SOLN Sublingual Place 1 spray  under the tongue every 5 (five) minutes as needed.    Marland Kitchen PEG 3350-KCL-NA BICARB-NACL 420 G PO SOLR Oral Take 4,000 mLs by mouth as directed. 4000 mL 0    BP 166/92  Pulse 65  Temp 98.6 F (37 C) (Oral)  Resp 16  SpO2 99%  Physical Exam  Nursing note and vitals reviewed. Constitutional: He is oriented to person, place, and time. He appears well-developed and well-nourished. No distress.  HENT:  Head: Normocephalic and atraumatic.  Mouth/Throat: Oropharynx is clear and moist.  Eyes: No scleral icterus.  Neck: No thyromegaly present.  Cardiovascular: Normal rate, regular rhythm and normal heart sounds.   Pulmonary/Chest: Breath sounds normal.  Abdominal: Bowel sounds are normal. He exhibits no distension and no mass. There is tenderness. There is guarding. There is no rebound.       No distention.Tenderness with palpation in the flank and left lower quadrant. No costovertebral tenderness  Genitourinary: Rectum normal, prostate normal and penis normal. Guaiac negative stool.  Neurological: He is alert and oriented to person, place, and time.  Skin: No rash noted. He is not diaphoretic.    ED Course  Procedures (including critical care time)  Labs Reviewed  POCT URINALYSIS DIP (DEVICE) - Abnormal; Notable for the following:    Hgb urine dipstick LARGE (*)     Protein, ur 100 (*)     Urobilinogen, UA 2.0 (*)     Nitrite POSITIVE (*)     Leukocytes, UA LARGE (*)  Biochemical Testing Only. Please order routine urinalysis from main lab if confirmatory testing is needed.   All other components within normal limits  POCT I-STAT, CHEM 8  CBC WITH DIFFERENTIAL  URINE CULTURE   No results found.   1. Left lower quadrant pain   2. Urinary tract infection       MDM  56 year old male with history of recent colonoscopy and polypectomy. Here with left lower quadrant tenderness, dysuria and hematuria. On exam afebrile, tenderness on left flank and left lower quadrant with palpation,  no rebound. Point-of-care urine pathologic with positive nitrate, LE, protein and large blood. Stool guaiac negative. Patient with this presentation and recent colonoscopy concerning for complications versus diverticulitis versus pyelo.  Patient had 1 gRocephin IM x1administer here prior to transfer.  Also was given a prescription for Cipro and CBC with differential and urine culture pending. Decided to transfer to the emergency department for possible further imaging for better characterization of patient's abdominal pain.         Sharin Grave, MD 10/29/11 1610

## 2011-10-28 NOTE — ED Notes (Signed)
Pt A.O. X 4. NAD. Respirations even and regular. Skin warm, dry, and intact. Vitals stable. Ambulatory. Verbalized understanding of diagnosis. Verbalized understanding of medication admin and need to complete fully dose. No further questions at this time.

## 2011-10-28 NOTE — ED Notes (Signed)
Had colonoscopy on 10-16 at [facility], w removal of polyp. Patient states he only consumed 1 glass of prep solution prior to procedure. C/o he has been having pain in left side , bleeding from rectum and pain w bleeding with urination since the procedure. States he was told the polyp was non-cancerous

## 2011-10-28 NOTE — ED Notes (Signed)
Patient transported to CT 

## 2011-10-28 NOTE — ED Notes (Signed)
Pt presents to department from [facility] for evaluation of dysuria/hematuria and abdominal pain. States he noticed blood in stool last Sunday, then noticed blood in urine several days later. Recently had colonoscopy on 10/16. Now states LLQ pain and sharp pain toward the end of urination. 8/10 pain at the time. He is conscious alert and oriented x4.

## 2011-10-30 ENCOUNTER — Ambulatory Visit: Payer: 59 | Admitting: Gastroenterology

## 2011-10-30 LAB — URINE CULTURE: Colony Count: >=100000

## 2011-10-30 NOTE — ED Notes (Signed)
Urine culture: >100,000 colonies E. Coli. Pt. adequately treated with Cipro. Vassie Moselle 10/30/2011

## 2011-10-31 ENCOUNTER — Ambulatory Visit (INDEPENDENT_AMBULATORY_CARE_PROVIDER_SITE_OTHER): Payer: 59 | Admitting: Urology

## 2011-10-31 DIAGNOSIS — N410 Acute prostatitis: Secondary | ICD-10-CM

## 2011-10-31 DIAGNOSIS — N400 Enlarged prostate without lower urinary tract symptoms: Secondary | ICD-10-CM

## 2011-10-31 DIAGNOSIS — R310 Gross hematuria: Secondary | ICD-10-CM

## 2011-12-16 ENCOUNTER — Encounter (HOSPITAL_COMMUNITY): Payer: Self-pay | Admitting: *Deleted

## 2011-12-16 ENCOUNTER — Emergency Department (HOSPITAL_COMMUNITY)
Admission: EM | Admit: 2011-12-16 | Discharge: 2011-12-16 | Disposition: A | Discharge status: Home / Self Care | Payer: 59 | Attending: Emergency Medicine | Admitting: Emergency Medicine

## 2011-12-16 ENCOUNTER — Other Ambulatory Visit: Payer: Self-pay

## 2011-12-16 ENCOUNTER — Encounter (HOSPITAL_COMMUNITY): Payer: Self-pay | Admitting: Family Medicine

## 2011-12-16 ENCOUNTER — Emergency Department (HOSPITAL_COMMUNITY): Payer: 59

## 2011-12-16 ENCOUNTER — Emergency Department (HOSPITAL_COMMUNITY)
Admission: EM | Admit: 2011-12-16 | Discharge: 2011-12-16 | Disposition: A | Discharge status: Nursing Home (Includes ICF) | Payer: 59 | Source: Home / Self Care | Attending: Emergency Medicine | Admitting: Emergency Medicine

## 2011-12-16 DIAGNOSIS — R509 Fever, unspecified: Secondary | ICD-10-CM | POA: Insufficient documentation

## 2011-12-16 DIAGNOSIS — R059 Cough, unspecified: Secondary | ICD-10-CM | POA: Insufficient documentation

## 2011-12-16 DIAGNOSIS — I2510 Atherosclerotic heart disease of native coronary artery without angina pectoris: Secondary | ICD-10-CM | POA: Insufficient documentation

## 2011-12-16 DIAGNOSIS — B349 Viral infection, unspecified: Secondary | ICD-10-CM

## 2011-12-16 DIAGNOSIS — F17200 Nicotine dependence, unspecified, uncomplicated: Secondary | ICD-10-CM | POA: Insufficient documentation

## 2011-12-16 DIAGNOSIS — I252 Old myocardial infarction: Secondary | ICD-10-CM | POA: Insufficient documentation

## 2011-12-16 DIAGNOSIS — J3489 Other specified disorders of nose and nasal sinuses: Secondary | ICD-10-CM | POA: Insufficient documentation

## 2011-12-16 DIAGNOSIS — R52 Pain, unspecified: Secondary | ICD-10-CM | POA: Insufficient documentation

## 2011-12-16 DIAGNOSIS — B9789 Other viral agents as the cause of diseases classified elsewhere: Secondary | ICD-10-CM | POA: Insufficient documentation

## 2011-12-16 DIAGNOSIS — Z9861 Coronary angioplasty status: Secondary | ICD-10-CM | POA: Insufficient documentation

## 2011-12-16 DIAGNOSIS — R079 Chest pain, unspecified: Secondary | ICD-10-CM

## 2011-12-16 DIAGNOSIS — E7800 Pure hypercholesterolemia, unspecified: Secondary | ICD-10-CM | POA: Insufficient documentation

## 2011-12-16 DIAGNOSIS — R05 Cough: Secondary | ICD-10-CM | POA: Insufficient documentation

## 2011-12-16 DIAGNOSIS — R5383 Other fatigue: Secondary | ICD-10-CM | POA: Insufficient documentation

## 2011-12-16 DIAGNOSIS — R5381 Other malaise: Secondary | ICD-10-CM | POA: Insufficient documentation

## 2011-12-16 DIAGNOSIS — J069 Acute upper respiratory infection, unspecified: Secondary | ICD-10-CM

## 2011-12-16 DIAGNOSIS — R0602 Shortness of breath: Secondary | ICD-10-CM | POA: Insufficient documentation

## 2011-12-16 DIAGNOSIS — R062 Wheezing: Secondary | ICD-10-CM | POA: Insufficient documentation

## 2011-12-16 DIAGNOSIS — Z79899 Other long term (current) drug therapy: Secondary | ICD-10-CM | POA: Insufficient documentation

## 2011-12-16 DIAGNOSIS — Z7982 Long term (current) use of aspirin: Secondary | ICD-10-CM | POA: Insufficient documentation

## 2011-12-16 LAB — COMPREHENSIVE METABOLIC PANEL
ALT: 23 U/L (ref 0–53)
AST: 18 U/L (ref 0–37)
Alkaline Phosphatase: 76 U/L (ref 39–117)
GFR calc Af Amer: 58 mL/min — ABNORMAL LOW (ref >90)
Glucose, Bld: 85 mg/dL (ref 70–99)
Potassium: 3.3 mEq/L — ABNORMAL LOW (ref 3.5–5.1)
Sodium: 139 mEq/L (ref 135–145)
Total Protein: 7.5 g/dL (ref 6.0–8.3)

## 2011-12-16 LAB — CBC WITH DIFFERENTIAL/PLATELET
Basophils Absolute: 0 10*3/uL (ref 0.0–0.1)
Eosinophils Absolute: 0.1 10*3/uL (ref 0.0–0.7)
Lymphocytes Relative: 15 % (ref 12–46)
Lymphs Abs: 0.9 10*3/uL (ref 0.7–4.0)
MCH: 30 pg (ref 26.0–34.0)
Neutrophils Relative %: 75 % (ref 43–77)
Platelets: 304 10*3/uL (ref 150–400)
RBC: 5.23 MIL/uL (ref 4.22–5.81)
WBC: 6.4 10*3/uL (ref 4.0–10.5)

## 2011-12-16 LAB — TROPONIN I: Troponin I: <0.3 ng/mL (ref <0.30)

## 2011-12-16 LAB — POCT I-STAT TROPONIN I

## 2011-12-16 MED ORDER — OSELTAMIVIR PHOSPHATE 75 MG PO CAPS: 75.0000 mg | ORAL_CAPSULE | Freq: Two times a day (BID) | ORAL | Status: DC

## 2011-12-16 MED ORDER — SODIUM CHLORIDE 0.9 % IV BOLUS (SEPSIS)
500.0000 mL | Freq: Once | INTRAVENOUS | Status: AC
  Administered 2011-12-16: 500 mL via INTRAVENOUS

## 2011-12-16 MED ORDER — ALBUTEROL SULFATE HFA 108 (90 BASE) MCG/ACT IN AERS: 1.0000 | INHALATION_SPRAY | Freq: Four times a day (QID) | RESPIRATORY_TRACT | Status: AC | PRN

## 2011-12-16 MED ORDER — SODIUM CHLORIDE 0.9 % IV SOLN: INTRAVENOUS | Status: DC

## 2011-12-16 MED ORDER — MUCINEX DM 30-600 MG PO TB12: 1.0000 | ORAL_TABLET | Freq: Two times a day (BID) | ORAL | Status: DC

## 2011-12-16 NOTE — ED Provider Notes (Signed)
History     CSN: [id number]  Arrival date & time 12/16/11  1134   First MD Initiated Contact with Patient 12/16/11 1239      Chief Complaint  Patient presents with  . Chest Pain  . Fever    (Consider location/radiation/quality/duration/timing/severity/associated sxs/prior treatment) HPI Comments: Patient presents with urgent care describing that last night as he was watching TV suddenly felt a sensation of pressure and pulling from his left precordial region that radiated down to his left arm and felt short of breath. Has been feeling tired with even walking to his kitchen. Denies any diaphoresis nausea or vomiting. Described that he's been having a cold for several days with some cough and congestion. Describes that the pain lasts for several minutes as it faded away. It took him ibuprofen doses morning, and decided to come in "to have it checked"  Patient is a 56 y.o. male presenting with chest pain and fever. The history is provided by the patient.  Chest Pain The chest pain began 6 - 12 hours ago. Chest pain occurs intermittently. The chest pain is resolved. The pain is associated with breathing and coughing. Primary symptoms include a fever, fatigue, shortness of breath, cough and dizziness. Pertinent negatives for primary symptoms include no wheezing, no palpitations and no abdominal pain.    Fever Primary symptoms of the febrile illness include fever, fatigue, cough and shortness of breath. Primary symptoms do not include headaches, wheezing or abdominal pain.    Past Medical History  Diagnosis Date  . Coronary atherosclerosis of native coronary artery     BMS LAD 1/13, LVEF 55-60%  . MI (myocardial infarction)     AMI 1/13  . Hypercholesteremia     Past Surgical History  Procedure Date  . Coronary stent placement   . Colonoscopy 11/14/2006    Mottled around pedunculated polyp suspicious for carcinoid removed   cleanly with the hot snare likely represents cause of the  patient's  hematochezia/  5 mm middescending and ascending colon polyps removed with cold snare technique, right sided diverticula.  Remainder of colonic mucosa appeared normal. Rectum path unremarkable. Desc colon polyp adenomatous. Next TCS 11/2011.   . Cardiac catheterization   . Colonoscopy 10/18/2011    Colonic diverticulosis. Colonic polyp as removed as described above    Family History  Problem Relation Age of Onset  . Diabetes type II Father   . Coronary artery disease Father   . Heart attack Father   . Diabetes type II Mother   . Breast cancer Mother   . Colon cancer Neg Hx     History  Substance Use Topics  . Smoking status: Current Every Day Smoker -- 1.0 packs/day for 40 years    Types: Cigarettes  . Smokeless tobacco: Never Used  . Alcohol Use: No      Review of Systems  Constitutional: Positive for fever, appetite change and fatigue. Negative for activity change.  Respiratory: Positive for cough and shortness of breath. Negative for wheezing.   Cardiovascular: Positive for chest pain. Negative for palpitations.  Gastrointestinal: Negative for abdominal pain.  Neurological: Positive for dizziness. Negative for light-headedness and headaches.    Allergies  Review of patient's allergies indicates no known allergies.  Home Medications   Current Outpatient Rx  Name  Route  Sig  Dispense  Refill  . ATORVASTATIN CALCIUM 40 MG PO TABS   Oral   Take 1 tablet (40 mg total) by mouth daily.   30 tablet  6   . CLONIDINE HCL 0.1 MG PO TABS   Oral   Take 1 tablet (0.1 mg total) by mouth 2 (two) times daily.   60 tablet   6   . CLOPIDOGREL BISULFATE 75 MG PO TABS   Oral   Take 1 tablet (75 mg total) by mouth daily.   30 tablet   6   . LOSARTAN POTASSIUM 50 MG PO TABS   Oral   Take 1 tablet (50 mg total) by mouth daily.   90 tablet   1     Please deliver to [facility]   . METOPROLOL SUCCINATE ER 50 MG PO TB24   Oral   Take 1 tablet (50 mg total)  by mouth daily. Take with or immediately following a meal.   90 tablet   3   . ASPIRIN 81 MG PO TABS   Oral   Take 81 mg by mouth daily.         Marland Kitchen CIPROFLOXACIN HCL 500 MG PO TABS   Oral   Take 1 tablet (500 mg total) by mouth 2 (two) times daily.   20 tablet   0   . CIPROFLOXACIN HCL 500 MG PO TABS   Oral   Take 1 tablet (500 mg total) by mouth 2 (two) times daily.   32 tablet   0   . HYDROCODONE-ACETAMINOPHEN 5-500 MG PO TABS   Oral   Take 1 tablet by mouth every 8 (eight) hours as needed for pain.   15 tablet   0   . NITROGLYCERIN 0.4 MG/SPRAY TL SOLN   Sublingual   Place 1 spray under the tongue every 5 (five) minutes as needed. For chest pain.         Marland Kitchen PEG 3350-KCL-NA BICARB-NACL 420 G PO SOLR   Oral   Take 4,000 mLs by mouth as directed.   4000 mL   0     BP 134/84  Pulse 74  Temp 97.8 F (36.6 C) (Oral)  Resp 18  SpO2 95%  Physical Exam  Nursing note and vitals reviewed. Constitutional: He appears well-developed and well-nourished. No distress.  HENT:  Head: Normocephalic.  Eyes: Pupils are equal, round, and reactive to light.  Neck: Neck supple. No JVD present.  Pulmonary/Chest: Effort normal. No respiratory distress. He has decreased breath sounds. He has no wheezes. He has no rales. He exhibits no tenderness.  Neurological: He is alert.  Skin: No rash noted. No erythema.    ED Course  Procedures (including critical care time)  Labs Reviewed - No data to display No results found.   No diagnosis found.  EKG ventricular rate 72 beats per minute normal sinus rhythm nonspecific ST and T wave abnormality  MDM  Patient with symptomatology consistent with unstable angina, multiple CAD risk factors. Current EKG with nonischemic changes.        Jimmie Molly, MD 12/16/11 [phone]

## 2011-12-16 NOTE — ED Provider Notes (Signed)
History     CSN: [id number]  Arrival date & time 12/16/11  1309   First MD Initiated Contact with Patient 12/16/11 1419      Chief Complaint  Patient presents with  . Chest Pain    (Consider location/radiation/quality/duration/timing/severity/associated sxs/prior treatment) Patient is a 56 y.o. male presenting with chest pain. The history is provided by the patient and the spouse.  Chest Pain The chest pain began 12 - 24 hours ago. Primary symptoms include a fever, fatigue, shortness of breath, cough and wheezing. Pertinent negatives for primary symptoms include no palpitations, no abdominal pain, no nausea and no vomiting.  Pertinent negatives for associated symptoms include no diaphoresis.    patient with onset of chest pain at 3:00 yesterday afternoon. Patient has a history of coronary artery disease had a stent placed back in January following an MI. Chest pain was in the upper sternal area however he said other symptoms that seem like a viral illness at the same time he had the terminal as bodyaches chills fever feeling has had some cough wheezing and shortness of breath for several days and congestion. The chest pain now resolved completely sometime early this afternoon prior to being seen by me. Also his bodyaches and fever and chills have resolved. The patient did take Motrin last time at 6 AM in the morning. Patient primary care Dr. is Dr. Felecia Shelling in the [place] area.  Patient described the bodyaches is worse in the chest pain. The chest pain did cause some heavy feeling in the left arm but he had aches everywhere so it was hard to tell for sure. Pain now for the chest was described as 7/10.  Past Medical History  Diagnosis Date  . Coronary atherosclerosis of native coronary artery     BMS LAD 1/13, LVEF 55-60%  . MI (myocardial infarction)     AMI 1/13  . Hypercholesteremia     Past Surgical History  Procedure Date  . Coronary stent placement   . Colonoscopy 11/14/2006     Mottled around pedunculated polyp suspicious for carcinoid removed   cleanly with the hot snare likely represents cause of the patient's  hematochezia/  5 mm middescending and ascending colon polyps removed with cold snare technique, right sided diverticula.  Remainder of colonic mucosa appeared normal. Rectum path unremarkable. Desc colon polyp adenomatous. Next TCS 11/2011.   . Cardiac catheterization   . Colonoscopy 10/18/2011    Colonic diverticulosis. Colonic polyp as removed as described above    Family History  Problem Relation Age of Onset  . Diabetes type II Father   . Coronary artery disease Father   . Heart attack Father   . Diabetes type II Mother   . Breast cancer Mother   . Colon cancer Neg Hx     History  Substance Use Topics  . Smoking status: Current Every Day Smoker -- 1.0 packs/day for 40 years    Types: Cigarettes  . Smokeless tobacco: Never Used  . Alcohol Use: No      Review of Systems  Constitutional: Positive for fever, chills and fatigue. Negative for diaphoresis.  HENT: Positive for congestion. Negative for sore throat.   Eyes: Negative for redness.  Respiratory: Positive for cough, shortness of breath and wheezing.   Cardiovascular: Positive for chest pain. Negative for palpitations and leg swelling.  Gastrointestinal: Negative for nausea, vomiting, abdominal pain and diarrhea.  Genitourinary: Negative for dysuria.  Musculoskeletal: Positive for myalgias.  Skin: Negative for rash.  Neurological:  Negative for headaches.  Hematological: Does not bruise/bleed easily.  Psychiatric/Behavioral: Negative for confusion.    Allergies  Review of patient's allergies indicates no known allergies.  Home Medications   Current Outpatient Rx  Name  Route  Sig  Dispense  Refill  . ASPIRIN 81 MG PO TABS   Oral   Take 81 mg by mouth daily.         . ATORVASTATIN CALCIUM 40 MG PO TABS   Oral   Take 1 tablet (40 mg total) by mouth daily.   30  tablet   6   . CIPROFLOXACIN HCL 500 MG PO TABS   Oral   Take 1 tablet (500 mg total) by mouth 2 (two) times daily.   20 tablet   0   . CLONIDINE HCL 0.1 MG PO TABS   Oral   Take 1 tablet (0.1 mg total) by mouth 2 (two) times daily.   60 tablet   6   . CLOPIDOGREL BISULFATE 75 MG PO TABS   Oral   Take 1 tablet (75 mg total) by mouth daily.   30 tablet   6   . LOSARTAN POTASSIUM 50 MG PO TABS   Oral   Take 1 tablet (50 mg total) by mouth daily.   90 tablet   1     Please deliver to [facility]   . METOPROLOL SUCCINATE ER 50 MG PO TB24   Oral   Take 1 tablet (50 mg total) by mouth daily. Take with or immediately following a meal.   90 tablet   3   . NITROGLYCERIN 0.4 MG/SPRAY TL SOLN   Sublingual   Place 1 spray under the tongue every 5 (five) minutes as needed. For chest pain.         . ALBUTEROL SULFATE HFA 108 (90 BASE) MCG/ACT IN AERS   Inhalation   Inhale 1-2 puffs into the lungs every 6 (six) hours as needed for wheezing.   1 Inhaler   0   . MUCINEX DM 30-600 MG PO TB12   Oral   Take 1 tablet by mouth every 12 (twelve) hours.   28 each   0   . OSELTAMIVIR PHOSPHATE 75 MG PO CAPS   Oral   Take 1 capsule (75 mg total) by mouth every 12 (twelve) hours.   10 capsule   0     BP 153/90  Pulse 70  Temp 98.2 F (36.8 C) (Oral)  Resp 26  SpO2 100%  Physical Exam  Nursing note and vitals reviewed. Constitutional: He is oriented to person, place, and time. He appears well-developed and well-nourished. No distress.  HENT:  Head: Normocephalic and atraumatic.  Mouth/Throat: Oropharynx is clear and moist. No oropharyngeal exudate.  Eyes: Conjunctivae normal and EOM are normal. Pupils are equal, round, and reactive to light.  Neck: Normal range of motion. Neck supple.  Cardiovascular: Normal rate, regular rhythm and normal heart sounds.   Pulmonary/Chest: Effort normal and breath sounds normal. No respiratory distress. He has no wheezes.   Abdominal: Soft. Bowel sounds are normal. There is no tenderness.  Musculoskeletal: Normal range of motion. He exhibits no edema and no tenderness.  Neurological: He is alert and oriented to person, place, and time. No cranial nerve deficit. He exhibits normal muscle tone. Coordination normal.  Skin: Skin is warm. No rash noted.    ED Course  Procedures (including critical care time)  Labs Reviewed  COMPREHENSIVE METABOLIC PANEL - Abnormal; Notable  for the following:    Potassium 3.3 (*)     Creatinine, Ser 1.51 (*)     GFR calc non Af Amer 50 (*)     GFR calc Af Amer 58 (*)     All other components within normal limits  CBC WITH DIFFERENTIAL  PROTIME-INR  POCT I-STAT TROPONIN I  TROPONIN I   Dg Chest 2 View  12/16/2011  *RADIOLOGY REPORT*  Clinical Data: Chest pain  CHEST - 2 VIEW  Comparison: Prior chest x-ray 02/03/2011  Findings: The lungs are well-aerated and free from pulmonary edema, focal airspace consolidation or pulmonary nodule.  Cardiac and mediastinal contours are within normal limits.  No pneumothorax, or pleural effusion. No acute osseous findings. Unchanged appearance of well ossified bodies in the region of the left glenohumeral joint.  IMPRESSION:  No acute cardiopulmonary disease.   Original Report Authenticated By: Malachy Moan, M.D.    Results for orders placed during the hospital encounter of 12/16/11  CBC WITH DIFFERENTIAL      Component Value Range   WBC 6.4  4.0 - 10.5 K/uL   RBC 5.23  4.22 - 5.81 MIL/uL   Hemoglobin 15.7  13.0 - 17.0 g/dL   HCT 04.5  40.9 - 81.1 %   MCV 85.7  78.0 - 100.0 fL   MCH 30.0  26.0 - 34.0 pg   MCHC 35.0  30.0 - 36.0 g/dL   RDW 91.4  78.2 - 95.6 %   Platelets 304  150 - 400 K/uL   Neutrophils Relative 75  43 - 77 %   Neutro Abs 4.8  1.7 - 7.7 K/uL   Lymphocytes Relative 15  12 - 46 %   Lymphs Abs 0.9  0.7 - 4.0 K/uL   Monocytes Relative 8  3 - 12 %   Monocytes Absolute 0.5  0.1 - 1.0 K/uL   Eosinophils Relative 2  0  - 5 %   Eosinophils Absolute 0.1  0.0 - 0.7 K/uL   Basophils Relative 0  0 - 1 %   Basophils Absolute 0.0  0.0 - 0.1 K/uL  COMPREHENSIVE METABOLIC PANEL      Component Value Range   Sodium 139  135 - 145 mEq/L   Potassium 3.3 (*) 3.5 - 5.1 mEq/L   Chloride 101  96 - 112 mEq/L   CO2 27  19 - 32 mEq/L   Glucose, Bld 85  70 - 99 mg/dL   BUN 12  6 - 23 mg/dL   Creatinine, Ser 2.13 (*) 0.50 - 1.35 mg/dL   Calcium 9.7  8.4 - 08.6 mg/dL   Total Protein 7.5  6.0 - 8.3 g/dL   Albumin 3.9  3.5 - 5.2 g/dL   AST 18  0 - 37 U/L   ALT 23  0 - 53 U/L   Alkaline Phosphatase 76  39 - 117 U/L   Total Bilirubin 1.2  0.3 - 1.2 mg/dL   GFR calc non Af Amer 50 (*) >90 mL/min   GFR calc Af Amer 58 (*) >90 mL/min  PROTIME-INR      Component Value Range   Prothrombin Time 14.1  11.6 - 15.2 seconds   INR 1.10  0.00 - 1.49  POCT I-STAT TROPONIN I      Component Value Range   Troponin i, poc 0.00  0.00 - 0.08 ng/mL   Comment 3           TROPONIN I      Component  Value Range   Troponin I <0.30  <0.30 ng/mL    Date: 12/16/2011  Rate: 75  Rhythm: normal sinus rhythm  QRS Axis: normal  Intervals: normal  ST/T Wave abnormalities: nonspecific ST/T changes  Conduction Disutrbances:none  Narrative Interpretation:   Old EKG Reviewed: none available    1. Viral illness       MDM   Patient symptoms seem to be more consistent with a viral illness not distinctly flu but will treat with Tamiflu. Troponins x2 are negative so not consistent with an acute cardiac event also EKG without acute changes. Chest x-ray negative for pneumonia. Patient given some IV fluids in the emergency department. Will treat with Tamiflu Mucinex DM albuterol inhaler as needed. Followup with his primary care Dr. Patient is nontoxic no acute distress.        Shelda Jakes, MD 12/16/11 [phone]

## 2011-12-16 NOTE — ED Notes (Signed)
Pt reports midsternal non radiating chest pain associated with sob that started last night and went away this am. Pt however still complains of sob. Pt with noted increased respirations while at rest. O2 sats within normal on room air. No swelling to lower extremities.

## 2011-12-16 NOTE — ED Notes (Addendum)
Per pt sts chest pain that started last night. sts mid sternal without radiation. sts SOB. sts non productive cough. sts last night had a fever and was achy all over. Took ibuprofen this am.

## 2011-12-16 NOTE — ED Notes (Signed)
Pt reports chest pain with fever - described pain as " pulling" & radiating down left arm, short of breath, sob with exertion, constantly tired - MI with stents jan 2013

## 2012-01-09 ENCOUNTER — Ambulatory Visit: Payer: 59 | Admitting: Urology

## 2012-02-02 ENCOUNTER — Other Ambulatory Visit: Payer: Self-pay | Admitting: Cardiology

## 2012-02-07 ENCOUNTER — Other Ambulatory Visit: Payer: Self-pay | Admitting: Cardiology

## 2012-02-28 ENCOUNTER — Other Ambulatory Visit: Payer: Self-pay | Admitting: *Deleted

## 2012-02-28 DIAGNOSIS — E782 Mixed hyperlipidemia: Secondary | ICD-10-CM

## 2012-03-18 ENCOUNTER — Encounter (HOSPITAL_COMMUNITY): Payer: Self-pay | Admitting: Emergency Medicine

## 2012-03-18 ENCOUNTER — Emergency Department (HOSPITAL_COMMUNITY)
Admission: EM | Admit: 2012-03-18 | Discharge: 2012-03-18 | Disposition: A | Discharge status: Home / Self Care | Payer: 59 | Attending: Emergency Medicine | Admitting: Emergency Medicine

## 2012-03-18 ENCOUNTER — Emergency Department (HOSPITAL_COMMUNITY): Payer: 59

## 2012-03-18 DIAGNOSIS — Z7902 Long term (current) use of antithrombotics/antiplatelets: Secondary | ICD-10-CM | POA: Insufficient documentation

## 2012-03-18 DIAGNOSIS — Z79899 Other long term (current) drug therapy: Secondary | ICD-10-CM | POA: Insufficient documentation

## 2012-03-18 DIAGNOSIS — F17200 Nicotine dependence, unspecified, uncomplicated: Secondary | ICD-10-CM | POA: Insufficient documentation

## 2012-03-18 DIAGNOSIS — Z7982 Long term (current) use of aspirin: Secondary | ICD-10-CM | POA: Insufficient documentation

## 2012-03-18 DIAGNOSIS — I2510 Atherosclerotic heart disease of native coronary artery without angina pectoris: Secondary | ICD-10-CM | POA: Insufficient documentation

## 2012-03-18 DIAGNOSIS — I252 Old myocardial infarction: Secondary | ICD-10-CM | POA: Insufficient documentation

## 2012-03-18 DIAGNOSIS — E7800 Pure hypercholesterolemia, unspecified: Secondary | ICD-10-CM | POA: Insufficient documentation

## 2012-03-18 DIAGNOSIS — Z9861 Coronary angioplasty status: Secondary | ICD-10-CM | POA: Insufficient documentation

## 2012-03-18 DIAGNOSIS — R079 Chest pain, unspecified: Secondary | ICD-10-CM | POA: Insufficient documentation

## 2012-03-18 LAB — BASIC METABOLIC PANEL
CO2: 29 mEq/L (ref 19–32)
Chloride: 101 mEq/L (ref 96–112)
Glucose, Bld: 98 mg/dL (ref 70–99)
Potassium: 3.8 mEq/L (ref 3.5–5.1)
Sodium: 138 mEq/L (ref 135–145)

## 2012-03-18 LAB — CBC
Hemoglobin: 15.1 g/dL (ref 13.0–17.0)
MCH: 30.1 pg (ref 26.0–34.0)
Platelets: 348 10*3/uL (ref 150–400)
RBC: 5.02 MIL/uL (ref 4.22–5.81)
WBC: 8.9 10*3/uL (ref 4.0–10.5)

## 2012-03-18 MED ORDER — IBUPROFEN 800 MG PO TABS
800.0000 mg | ORAL_TABLET | Freq: Once | ORAL | Status: AC
  Administered 2012-03-18: 800 mg via ORAL
  Filled 2012-03-18: qty 1

## 2012-03-18 NOTE — ED Provider Notes (Signed)
History     CSN: [id number]  Arrival date & time 03/18/12  0044   First MD Initiated Contact with Patient 03/18/12 0134      Chief Complaint  Patient presents with  . Chest Pain    (Consider location/radiation/quality/duration/timing/severity/associated sxs/prior treatment) HPI Trevor Key is a 57 y.o. male who presents to the Emergency Department complaining of central chest pressure and pain all day. Worse with movement. He has taken his regular medicines today and took NTG SL at 9 PM with no change. Denies fever, chills, cough, nausea, vomiting.  PCP Dr. Felecia Shelling  Past Medical History  Diagnosis Date  . Coronary atherosclerosis of native coronary artery     BMS LAD 1/13, LVEF 55-60%  . MI (myocardial infarction)     AMI 1/13  . Hypercholesteremia     Past Surgical History  Procedure Laterality Date  . Coronary stent placement    . Colonoscopy  11/14/2006    Mottled around pedunculated polyp suspicious for carcinoid removed   cleanly with the hot snare likely represents cause of the patient's  hematochezia/  5 mm middescending and ascending colon polyps removed with cold snare technique, right sided diverticula.  Remainder of colonic mucosa appeared normal. Rectum path unremarkable. Desc colon polyp adenomatous. Next TCS 11/2011.   . Cardiac catheterization    . Colonoscopy  10/18/2011    Colonic diverticulosis. Colonic polyp as removed as described above    Family History  Problem Relation Age of Onset  . Diabetes type II Father   . Coronary artery disease Father   . Heart attack Father   . Diabetes type II Mother   . Breast cancer Mother   . Colon cancer Neg Hx     History  Substance Use Topics  . Smoking status: Current Every Day Smoker -- 1.00 packs/day for 40 years    Types: Cigarettes  . Smokeless tobacco: Never Used  . Alcohol Use: No      Review of Systems  Constitutional: Negative for fever.       10 Systems reviewed and are negative for acute  change except as noted in the HPI.  HENT: Negative for congestion.   Eyes: Negative for discharge and redness.  Respiratory: Negative for cough and shortness of breath.   Cardiovascular: Positive for chest pain.  Gastrointestinal: Negative for vomiting and abdominal pain.  Musculoskeletal: Negative for back pain.  Skin: Negative for rash.  Neurological: Negative for syncope, numbness and headaches.  Psychiatric/Behavioral:       No behavior change.    Allergies  Review of patient's allergies indicates no known allergies.  Home Medications   Current Outpatient Rx  Name  Route  Sig  Dispense  Refill  . albuterol (PROVENTIL HFA;VENTOLIN HFA) 108 (90 BASE) MCG/ACT inhaler   Inhalation   Inhale 1-2 puffs into the lungs every 6 (six) hours as needed for wheezing.   1 Inhaler   0   . aspirin 81 MG tablet   Oral   Take 81 mg by mouth daily.         Marland Kitchen atorvastatin (LIPITOR) 40 MG tablet      TAKE 1 TABLET BY MOUTH DAILY.   30 tablet   11   . ciprofloxacin (CIPRO) 500 MG tablet   Oral   Take 1 tablet (500 mg total) by mouth 2 (two) times daily.   20 tablet   0   . cloNIDine (CATAPRES) 0.1 MG tablet      TAKE  1 TABLET BY MOUTH 2 TIMES DAILY.   60 tablet   PRN   . clopidogrel (PLAVIX) 75 MG tablet      TAKE 1 TABLET BY MOUTH DAILY.   30 tablet   11   . Dextromethorphan-Guaifenesin (MUCINEX DM) 30-600 MG TB12   Oral   Take 1 tablet by mouth every 12 (twelve) hours.   28 each   0   . losartan (COZAAR) 50 MG tablet   Oral   Take 1 tablet (50 mg total) by mouth daily.   90 tablet   1     Please deliver to [facility]   . metoprolol succinate (TOPROL-XL) 50 MG 24 hr tablet   Oral   Take 1 tablet (50 mg total) by mouth daily. Take with or immediately following a meal.   90 tablet   3   . nitroGLYCERIN (NITROLINGUAL) 0.4 MG/SPRAY spray   Sublingual   Place 1 spray under the tongue every 5 (five) minutes as needed. For chest pain.         Marland Kitchen  oseltamivir (TAMIFLU) 75 MG capsule   Oral   Take 1 capsule (75 mg total) by mouth every 12 (twelve) hours.   10 capsule   0     BP 162/85  Pulse 54  Temp(Src) 97.9 F (36.6 C) (Oral)  Resp 22  SpO2 100%  Physical Exam  Nursing note and vitals reviewed. Constitutional: He appears well-developed and well-nourished.  Awake, alert, nontoxic appearance.  HENT:  Head: Normocephalic and atraumatic.  Right Ear: External ear normal.  Left Ear: External ear normal.  Mouth/Throat: Oropharynx is clear and moist.  Eyes: EOM are normal. Pupils are equal, round, and reactive to light. Right eye exhibits no discharge. Left eye exhibits no discharge.  Neck: Normal range of motion. Neck supple.  Cardiovascular: Normal rate and intact distal pulses.   Chest tenderness with movement to the left side of the chest adjacent to the sternum  Pulmonary/Chest: Effort normal and breath sounds normal. He exhibits no tenderness.  Abdominal: Soft. Bowel sounds are normal. There is no tenderness. There is no rebound.  Musculoskeletal: He exhibits no tenderness.  Baseline ROM, no obvious new focal weakness.  Neurological:  Mental status and motor strength appears baseline for patient and situation.  Skin: No rash noted.  Psychiatric: He has a normal mood and affect.    ED Course  Procedures (including critical care time) Results for orders placed during the hospital encounter of 03/18/12  CBC      Result Value Range   WBC 8.9  4.0 - 10.5 K/uL   RBC 5.02  4.22 - 5.81 MIL/uL   Hemoglobin 15.1  13.0 - 17.0 g/dL   HCT 47.8  29.5 - 62.1 %   MCV 85.3  78.0 - 100.0 fL   MCH 30.1  26.0 - 34.0 pg   MCHC 35.3  30.0 - 36.0 g/dL   RDW 30.8  65.7 - 84.6 %   Platelets 348  150 - 400 K/uL  BASIC METABOLIC PANEL      Result Value Range   Sodium 138  135 - 145 mEq/L   Potassium 3.8  3.5 - 5.1 mEq/L   Chloride 101  96 - 112 mEq/L   CO2 29  19 - 32 mEq/L   Glucose, Bld 98  70 - 99 mg/dL   BUN 10  6 - 23  mg/dL   Creatinine, Ser 9.62  0.50 - 1.35 mg/dL  Calcium 9.4  8.4 - 10.5 mg/dL   GFR calc non Af Amer 68 (*) >90 mL/min   GFR calc Af Amer 79 (*) >90 mL/min  TROPONIN I      Result Value Range   Troponin I <0.30  <0.30 ng/mL    Dg Chest Port 1 View  03/18/2012  *RADIOLOGY REPORT*  Clinical Data: Chest pain.  PORTABLE CHEST - 1 VIEW  Comparison: 12/16/2011  Findings: The heart size and pulmonary vascularity are normal. The lungs appear clear and expanded without focal air space disease or consolidation. No blunting of the costophrenic angles.  No pneumothorax.  Mediastinal contours appear intact.  Degenerative changes in the left shoulder with loose bodies versus synovial osteochondromatosis without change since previous study.  IMPRESSION: No evidence of active pulmonary disease.   Original Report Authenticated By: Burman Nieves, M.D.     Date: 03/18/2012  0056  Rate: 51  Rhythm: sinus bradycardia QRS Axis: normal  Intervals: normal  ST/T Wave abnormalities: normal  Conduction Disutrbances: none  Narrative Interpretation: rate slower c/w 12/16/11       MDM  Patient with central chest pain without evidence of exercise. Pain is worse when moving. Chest xray is negative. Troponin is negative. EKG is unremarkable. Will give ibuprofen for pain. Reviewed results with patient. Pt stable in ED with no significant deterioration in condition.The patient appears reasonably screened and/or stabilized for discharge and I doubt any other medical condition or other [facility] requiring further screening, evaluation, or treatment in the ED at this time prior to discharge.  MDM Reviewed: nursing note and vitals Interpretation: labs, ECG and x-ray           Nicoletta Dress. Colon Branch, MD 03/18/12 0157

## 2012-03-18 NOTE — ED Notes (Signed)
Pt states he took an ASA in the am and two sprays of his Nitro at roughly 21:30-22:00 in the evening with no relief from the pain.

## 2012-03-18 NOTE — ED Notes (Signed)
Pt states he has had central chest pain "all day today" Denies nausea, dizziness, but c/o SOB.

## 2012-07-01 ENCOUNTER — Telehealth: Payer: Self-pay | Admitting: *Deleted

## 2012-07-01 NOTE — Telephone Encounter (Signed)
A user error has taken place: clicked on incorrect pt.

## 2012-07-24 IMAGING — CR DG CHEST 2V
2 series · 2 of 2 positions shown · non-contrast
Comparison: January 25, 2005

CLINICAL DATA: Recent heart attack; left anterior pain

CHEST - 2 VIEW

[view not recorded (1 of 2)]
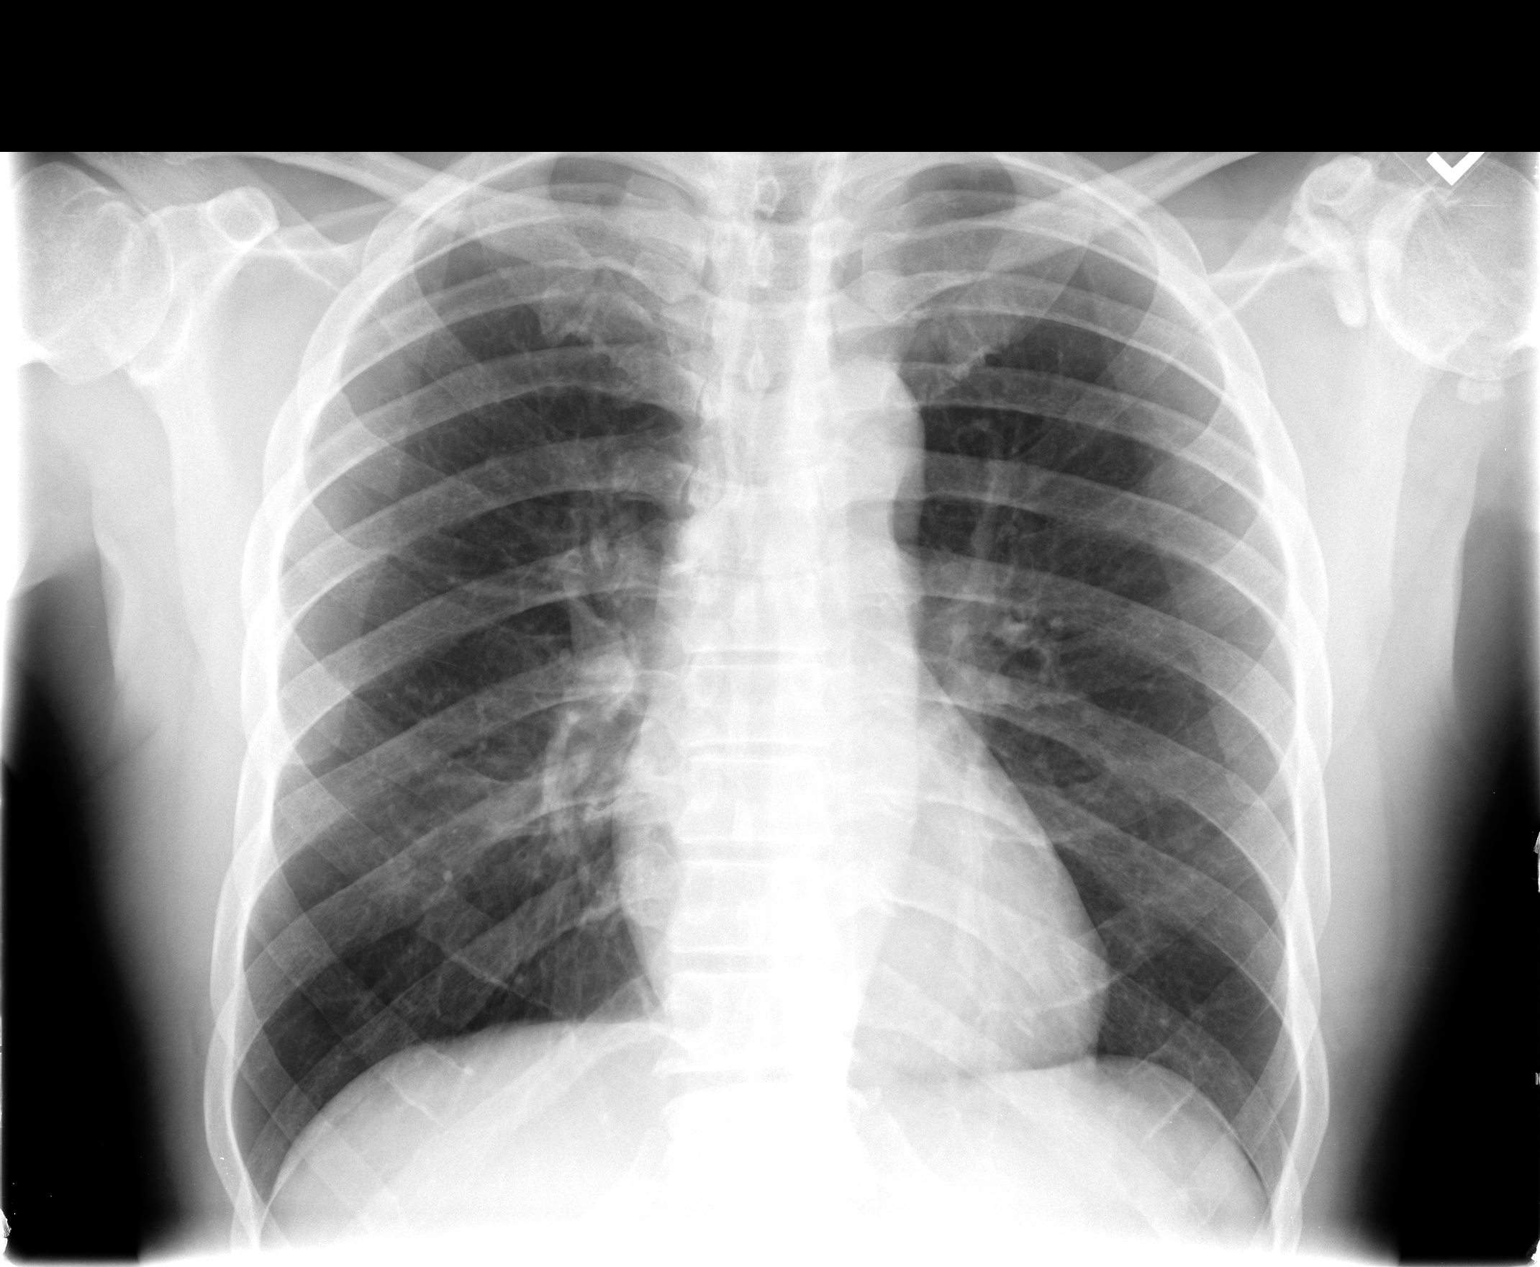

[view not recorded (2 of 2)]
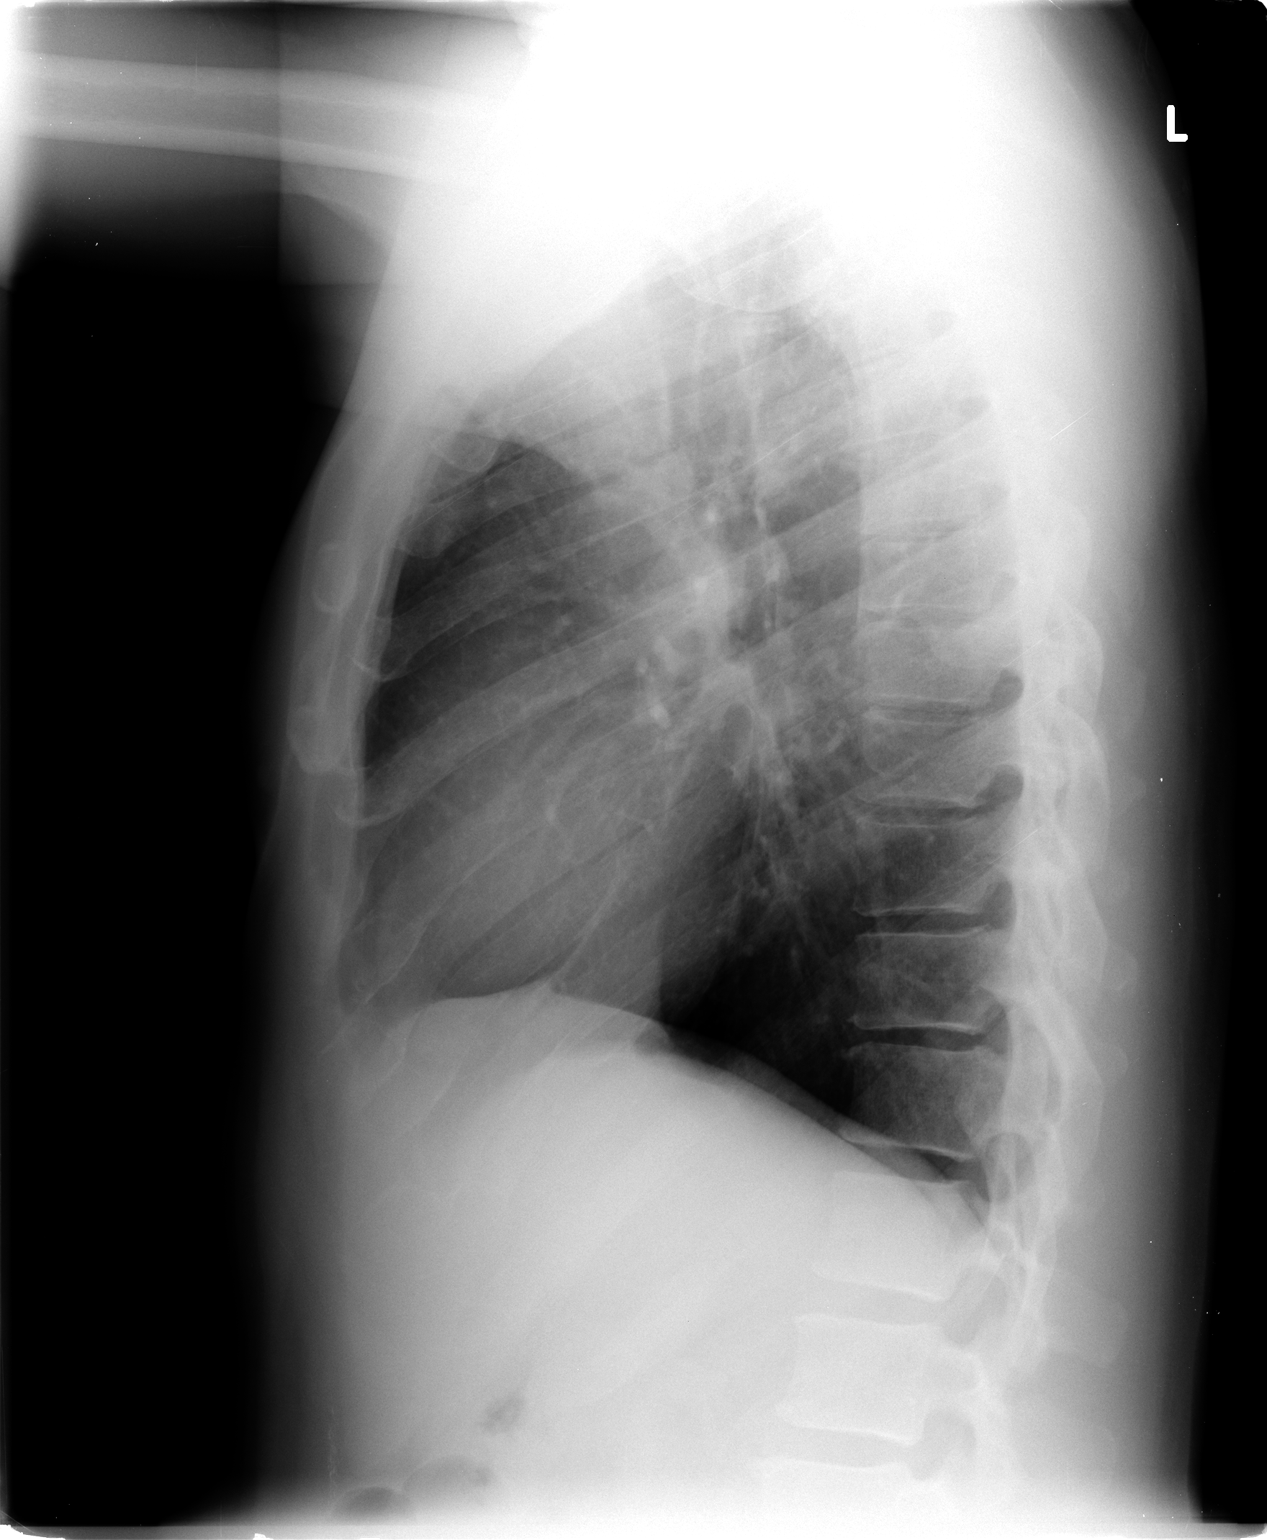

[2 of 2 positions shown; findings below may reference images not displayed]

FINDINGS: The cardiac silhouette, mediastinum, pulmonary
vasculature are within normal limits.  Both lungs are clear.
There is no acute bony abnormality.
IMPRESSION: There is no evidence of acute cardiac or pulmonary process.

## 2013-04-17 IMAGING — CT CT ABD-PELV W/ CM
2 of 5 series · 17 of 46 positions shown, 19 images · IV contrast (APPLIED)
Comparison: 06/09/2009

CLINICAL DATA: Abdominal pain, nausea, hematuria

CT ABDOMEN AND PELVIS WITH CONTRAST
TECHNIQUE: Multidetector CT imaging of the abdomen and pelvis was
performed following the standard protocol during bolus
administration of intravenous contrast.
Contrast: 100mL OMNIPAQUE IOHEXOL 300 MG/ML  SOLN

[Series 2: abd/pelv with 5.0 b31f st · axial · 0.71mm/px · z∈[-412,-42]mm · 14 of 84 slices shown, 16 images]
[im 5/84  soft-tissue]
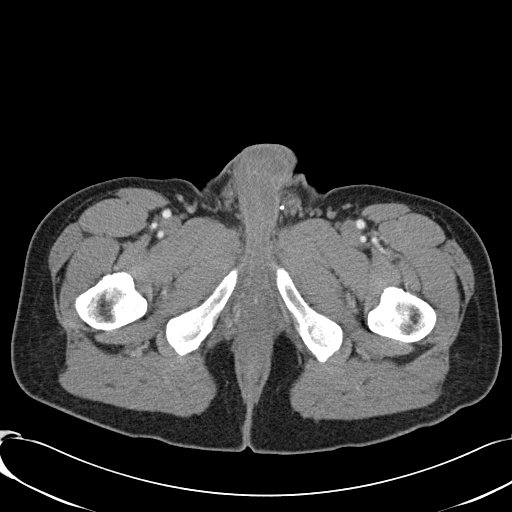
[im 5/84  bone]
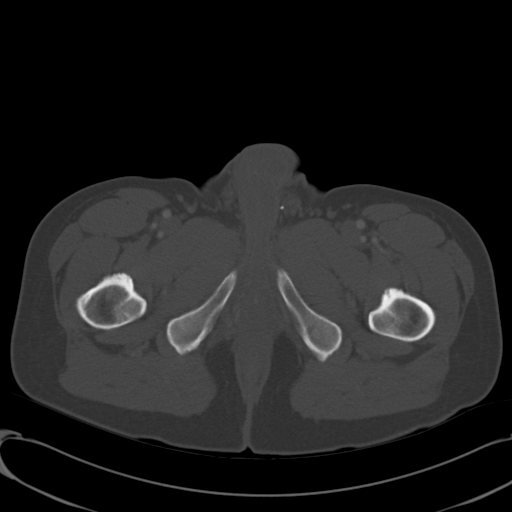
[im 9/84  soft-tissue]
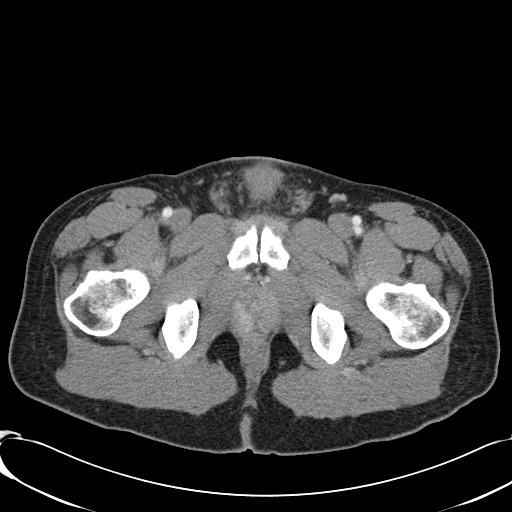
[im 18/84  soft-tissue]
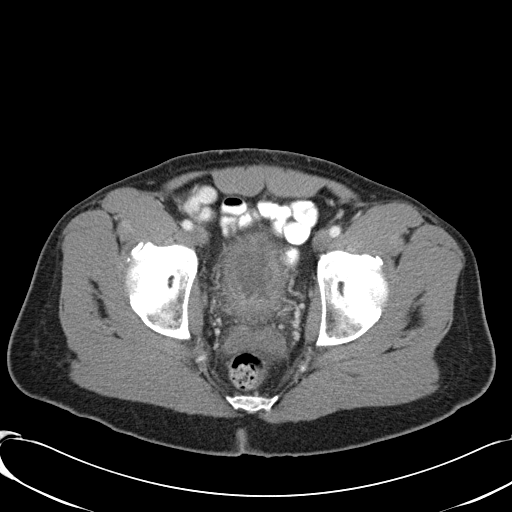
[im 22/84  soft-tissue]
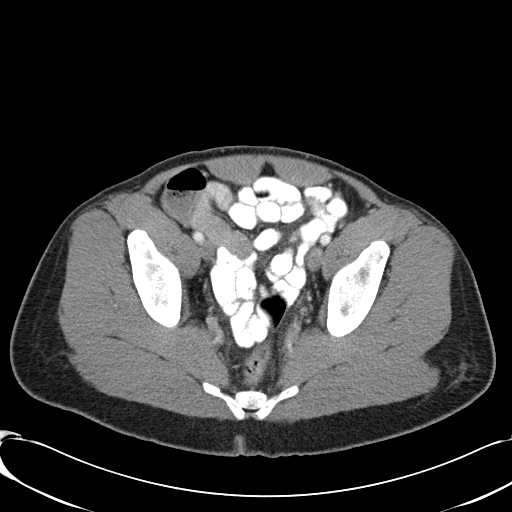
[im 27/84  soft-tissue]
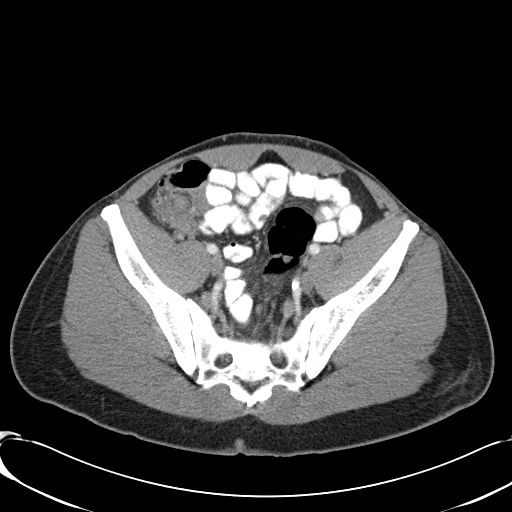
[im 35/84  soft-tissue]
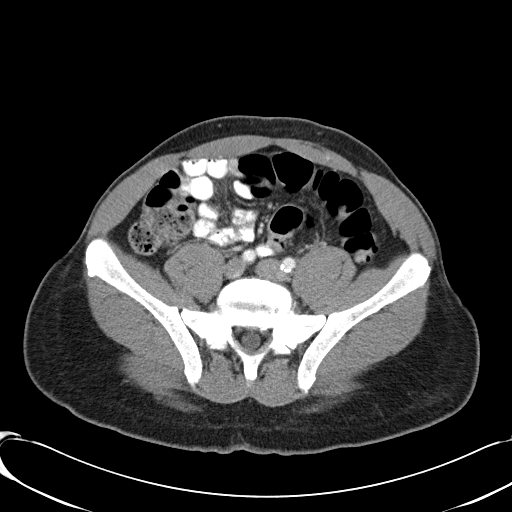
[im 40/84  soft-tissue]
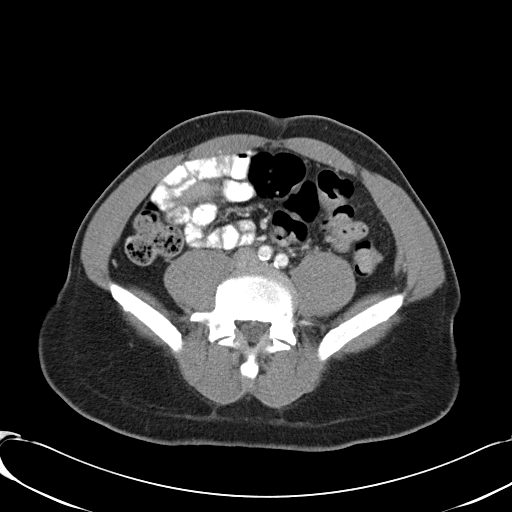
[im 44/84  soft-tissue]
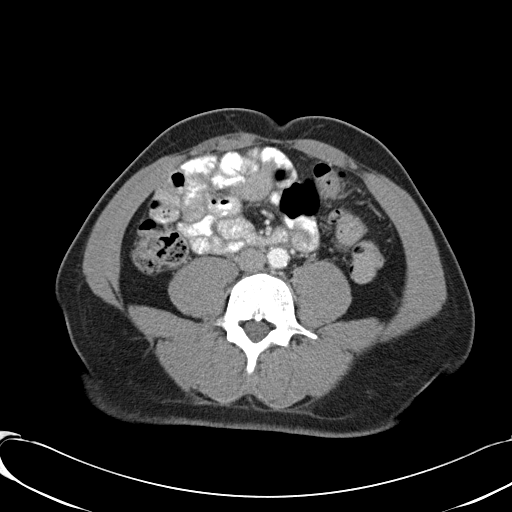
[im 49/84  soft-tissue]
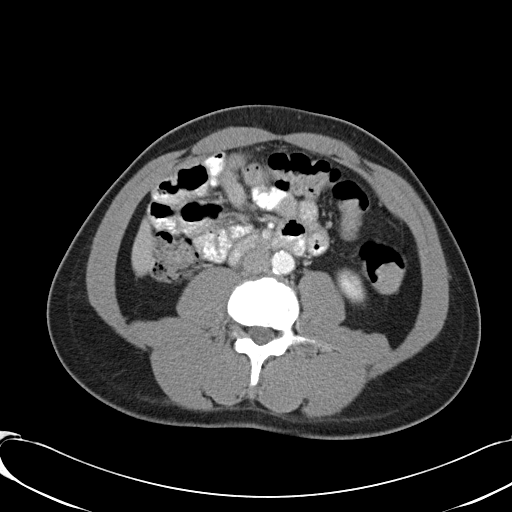
[im 49/84  bone]
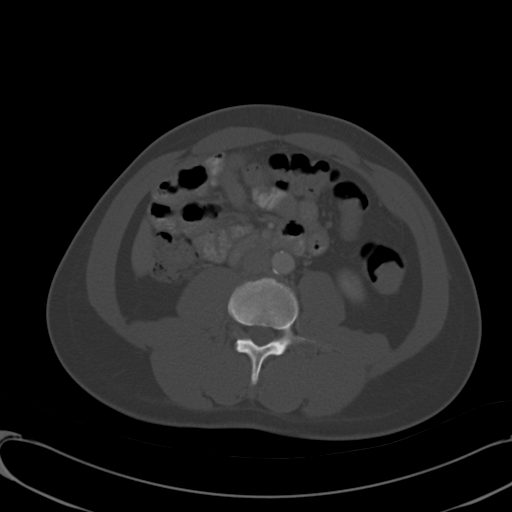
[im 57/84  soft-tissue]
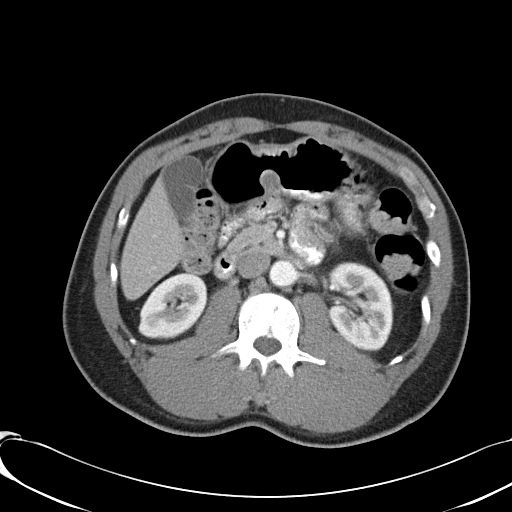
[im 62/84  soft-tissue]
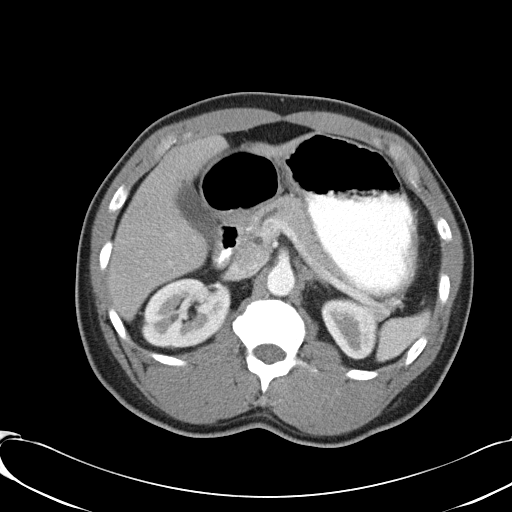
[im 66/84  soft-tissue]
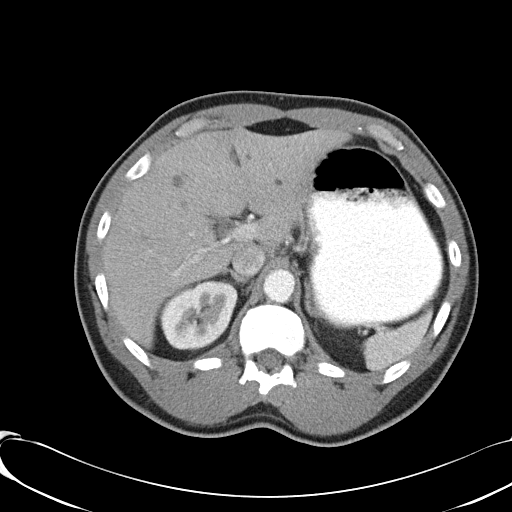
[im 75/84  soft-tissue]
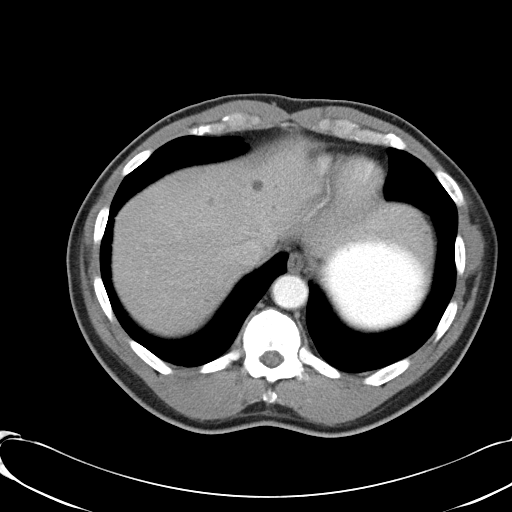
[im 79/84  soft-tissue]
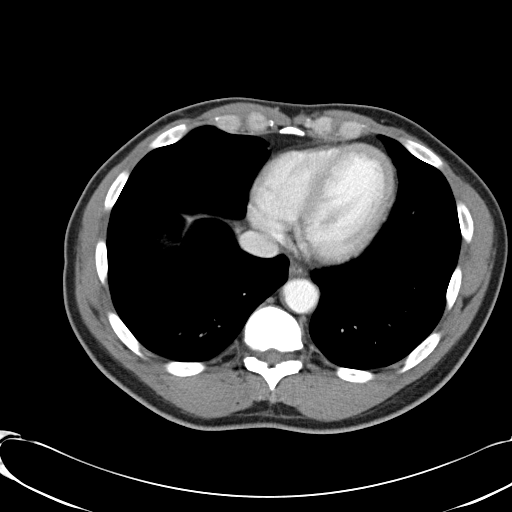

[Series 5: coronals · coronal · 0.82mm/px · 3 of 98 slices shown]
[im 33/98  soft-tissue]
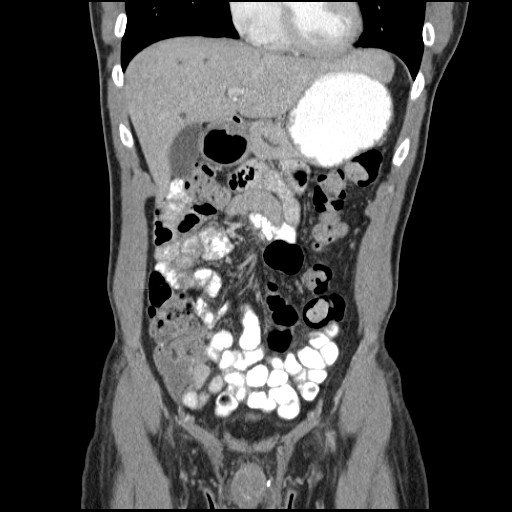
[im 44/98  soft-tissue]
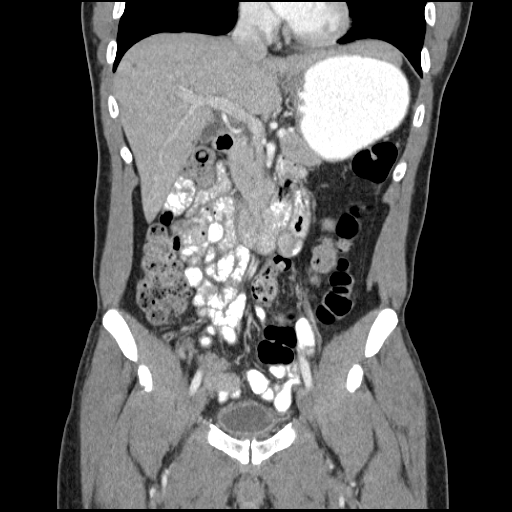
[im 54/98  soft-tissue]
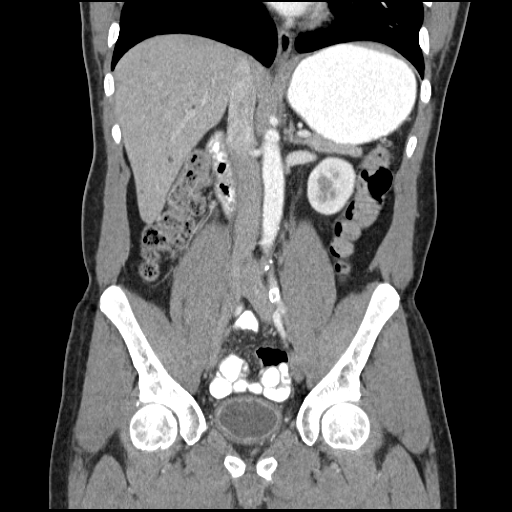

[17 of 46 positions shown; findings below may reference images not displayed]

FINDINGS: Lung bases are clear.

Scattered tiny hepatic cysts measuring up to 1.3 cm.

Spleen, pancreas, and adrenal glands are within normal limits.

Gallbladder unremarkable.  No intrahepatic or extrahepatic ductal
dilatation.

Kidneys are unremarkable.  No renal calculi or hydronephrosis.

No evidence of bowel obstruction.  Normal appendix.

Atherosclerotic calcifications of the abdominal aorta and branch
vessels.

No abdominopelvic ascites.

No suspicious abdominopelvic lymphadenopathy.

Prostate is notable for heterogeneous/patchy enhancement (series
2/image 74), nonspecific, correlate for prostatitis.

Bladder is notable for irregular bladder wall thickening /
enhancement (series 2/image 70), possibly reflecting cystitis.

Degenerative changes of the visualized thoracolumbar spine.
IMPRESSION: Heterogeneous enhancement of the prostate, nonspecific, correlate
for prostatitis.

Irregular bladder wall thickening with enhancement, favored to
reflect cystitis.  Consider follow-up cystoscopy for further
evaluation.

## 2014-07-02 ENCOUNTER — Encounter (HOSPITAL_COMMUNITY): Payer: Self-pay

## 2014-07-02 ENCOUNTER — Emergency Department (HOSPITAL_COMMUNITY)
Admission: EM | Admit: 2014-07-02 | Discharge: 2014-07-02 | Disposition: A | Discharge status: Home / Self Care | Payer: Medicare Other | Attending: Emergency Medicine | Admitting: Emergency Medicine

## 2014-07-02 ENCOUNTER — Emergency Department (HOSPITAL_COMMUNITY): Payer: Medicare Other

## 2014-07-02 DIAGNOSIS — E780 Pure hypercholesterolemia: Secondary | ICD-10-CM | POA: Insufficient documentation

## 2014-07-02 DIAGNOSIS — Z79899 Other long term (current) drug therapy: Secondary | ICD-10-CM | POA: Diagnosis not present

## 2014-07-02 DIAGNOSIS — Z7982 Long term (current) use of aspirin: Secondary | ICD-10-CM | POA: Diagnosis not present

## 2014-07-02 DIAGNOSIS — R6883 Chills (without fever): Secondary | ICD-10-CM | POA: Diagnosis not present

## 2014-07-02 DIAGNOSIS — I2510 Atherosclerotic heart disease of native coronary artery without angina pectoris: Secondary | ICD-10-CM | POA: Diagnosis not present

## 2014-07-02 DIAGNOSIS — I252 Old myocardial infarction: Secondary | ICD-10-CM | POA: Insufficient documentation

## 2014-07-02 DIAGNOSIS — R062 Wheezing: Secondary | ICD-10-CM | POA: Insufficient documentation

## 2014-07-02 DIAGNOSIS — R509 Fever, unspecified: Secondary | ICD-10-CM | POA: Diagnosis not present

## 2014-07-02 DIAGNOSIS — Z720 Tobacco use: Secondary | ICD-10-CM | POA: Insufficient documentation

## 2014-07-02 DIAGNOSIS — R059 Cough, unspecified: Secondary | ICD-10-CM

## 2014-07-02 DIAGNOSIS — R0602 Shortness of breath: Secondary | ICD-10-CM | POA: Insufficient documentation

## 2014-07-02 DIAGNOSIS — R05 Cough: Secondary | ICD-10-CM | POA: Diagnosis not present

## 2014-07-02 LAB — CBC WITH DIFFERENTIAL/PLATELET
BASOS PCT: 0 % (ref 0–1)
Basophils Absolute: 0 10*3/uL (ref 0.0–0.1)
EOS ABS: 0 10*3/uL (ref 0.0–0.7)
EOS PCT: 1 % (ref 0–5)
HEMATOCRIT: 41.6 % (ref 39.0–52.0)
Hemoglobin: 14 g/dL (ref 13.0–17.0)
Lymphocytes Relative: 10 % — ABNORMAL LOW (ref 12–46)
Lymphs Abs: 0.8 10*3/uL (ref 0.7–4.0)
MCH: 29.5 pg (ref 26.0–34.0)
MCHC: 33.7 g/dL (ref 30.0–36.0)
MCV: 87.6 fL (ref 78.0–100.0)
MONO ABS: 0.1 10*3/uL (ref 0.1–1.0)
MONOS PCT: 1 % — AB (ref 3–12)
NEUTROS ABS: 7.7 10*3/uL (ref 1.7–7.7)
Neutrophils Relative %: 88 % — ABNORMAL HIGH (ref 43–77)
PLATELETS: 296 10*3/uL (ref 150–400)
RBC: 4.75 MIL/uL (ref 4.22–5.81)
RDW: 13.8 % (ref 11.5–15.5)
WBC: 8.7 10*3/uL (ref 4.0–10.5)

## 2014-07-02 LAB — URINALYSIS, ROUTINE W REFLEX MICROSCOPIC
Bilirubin Urine: NEGATIVE
GLUCOSE, UA: NEGATIVE mg/dL
Ketones, ur: NEGATIVE mg/dL
Leukocytes, UA: NEGATIVE
NITRITE: NEGATIVE
PH: 6 (ref 5.0–8.0)
PROTEIN: NEGATIVE mg/dL
Specific Gravity, Urine: 1.015 (ref 1.005–1.030)
UROBILINOGEN UA: 1 mg/dL (ref 0.0–1.0)

## 2014-07-02 LAB — BASIC METABOLIC PANEL
Anion gap: 4 — ABNORMAL LOW (ref 5–15)
BUN: 14 mg/dL (ref 6–20)
CO2: 30 mmol/L (ref 22–32)
CREATININE: 1.26 mg/dL — AB (ref 0.61–1.24)
Calcium: 9 mg/dL (ref 8.9–10.3)
Chloride: 107 mmol/L (ref 101–111)
GFR calc non Af Amer: >60 mL/min (ref >60)
Glucose, Bld: 92 mg/dL (ref 65–99)
Potassium: 3.7 mmol/L (ref 3.5–5.1)
Sodium: 141 mmol/L (ref 135–145)

## 2014-07-02 LAB — URINE MICROSCOPIC-ADD ON

## 2014-07-02 LAB — BRAIN NATRIURETIC PEPTIDE: B NATRIURETIC PEPTIDE 5: 49 pg/mL (ref 0.0–100.0)

## 2014-07-02 LAB — LACTIC ACID, PLASMA: LACTIC ACID, VENOUS: 1.8 mmol/L (ref 0.5–2.0)

## 2014-07-02 LAB — TROPONIN I: Troponin I: <0.03 ng/mL (ref <0.031)

## 2014-07-02 MED ORDER — AZITHROMYCIN 250 MG PO TABS: 250.0000 mg | ORAL_TABLET | Freq: Every day | ORAL | Status: DC

## 2014-07-02 MED ORDER — PREDNISONE 20 MG PO TABS: 40.0000 mg | ORAL_TABLET | Freq: Every day | ORAL | Status: DC

## 2014-07-02 MED ORDER — SODIUM CHLORIDE 0.9 % IV BOLUS (SEPSIS)
1000.0000 mL | Freq: Once | INTRAVENOUS | Status: AC
  Administered 2014-07-02: 1000 mL via INTRAVENOUS

## 2014-07-02 MED ORDER — ACETAMINOPHEN 500 MG PO TABS
1000.0000 mg | ORAL_TABLET | Freq: Once | ORAL | Status: AC
  Administered 2014-07-02: 1000 mg via ORAL
  Filled 2014-07-02: qty 2

## 2014-07-02 NOTE — Discharge Instructions (Signed)
You were seen today for fever and cough. He may be developing an early respiratory infection.  Given that you are a smoker, he will be given steroids and antibiotics. Follow-up with your primary doctor in 1-2 days.  Fever, Adult A fever is a higher than normal body temperature. In an adult, an oral temperature around 98.6 F (37 C) is considered normal. A temperature of 100.4 F (38 C) or higher is generally considered a fever. Mild or moderate fevers generally have no long-term effects and often do not require treatment. Extreme fever (greater than or equal to 106 F or 41.1 C) can cause seizures. The sweating that may occur with repeated or prolonged fever may cause dehydration. Elderly people can develop confusion during a fever. A measured temperature can vary with:  Age.  Time of day.  Method of measurement (mouth, underarm, rectal, or ear). The fever is confirmed by taking a temperature with a thermometer. Temperatures can be taken different ways. Some methods are accurate and some are not.  An oral temperature is used most commonly. Electronic thermometers are fast and accurate.  An ear temperature will only be accurate if the thermometer is positioned as recommended by the manufacturer.  A rectal temperature is accurate and done for those adults who have a condition where an oral temperature cannot be taken.  An underarm (axillary) temperature is not accurate and not recommended. Fever is a symptom, not a disease.  CAUSES   Infections commonly cause fever.  Some noninfectious causes for fever include:  Some arthritis conditions.  Some thyroid or adrenal gland conditions.  Some immune system conditions.  Some types of cancer.  A medicine reaction.  High doses of certain street drugs such as methamphetamine.  Dehydration.  Exposure to high outside or room temperatures.  Occasionally, the source of a fever cannot be determined. This is sometimes called a "fever of  unknown origin" (FUO).  Some situations may lead to a temporary rise in body temperature that may go away on its own. Examples are:  Childbirth.  Surgery.  Intense exercise. HOME CARE INSTRUCTIONS   Take appropriate medicines for fever. Follow dosing instructions carefully. If you use acetaminophen to reduce the fever, be careful to avoid taking other medicines that also contain acetaminophen. Do not take aspirin for a fever if you are younger than age 22. There is an association with Reye's syndrome. Reye's syndrome is a rare but potentially deadly disease.  If an infection is present and antibiotics have been prescribed, take them as directed. Finish them even if you start to feel better.  Rest as needed.  Maintain an adequate fluid intake. To prevent dehydration during an illness with prolonged or recurrent fever, you may need to drink extra fluid.Drink enough fluids to keep your urine clear or pale yellow.  Sponging or bathing with room temperature water may help reduce body temperature. Do not use ice water or alcohol sponge baths.  Dress comfortably, but do not over-bundle. SEEK MEDICAL CARE IF:   You are unable to keep fluids down.  You develop vomiting or diarrhea.  You are not feeling at least partly better after 3 days.  You develop new symptoms or problems. SEEK IMMEDIATE MEDICAL CARE IF:   You have shortness of breath or trouble breathing.  You develop excessive weakness.  You are dizzy or you faint.  You are extremely thirsty or you are making little or no urine.  You develop new pain that was not there before (such as  in the head, neck, chest, back, or abdomen).  You have persistent vomiting and diarrhea for more than 1 to 2 days.  You develop a stiff neck or your eyes become sensitive to light.  You develop a skin rash.  You have a fever or persistent symptoms for more than 2 to 3 days.  You have a fever and your symptoms suddenly get worse. MAKE  SURE YOU:   Understand these instructions.  Will watch your condition.  Will get help right away if you are not doing well or get worse. Document Released: 06/14/2000 Document Revised: 05/05/2013 Document Reviewed: 10/20/2010 [facility] Patient Information 2015 [place], [place]. This information is not intended to replace advice given to you by your health care provider. Make sure you discuss any questions you have with your health care provider.  Cough, Adult  A cough is a reflex that helps clear your throat and airways. It can help heal the body or may be a reaction to an irritated airway. A cough may only last 2 or 3 weeks (acute) or may last more than 8 weeks (chronic).  CAUSES Acute cough:  Viral or bacterial infections. Chronic cough:  Infections.  Allergies.  Asthma.  Post-nasal drip.  Smoking.  Heartburn or acid reflux.  Some medicines.  Chronic lung problems (COPD).  Cancer. SYMPTOMS   Cough.  Fever.  Chest pain.  Increased breathing rate.  High-pitched whistling sound when breathing (wheezing).  Colored mucus that you cough up (sputum). TREATMENT   A bacterial cough may be treated with antibiotic medicine.  A viral cough must run its course and will not respond to antibiotics.  Your caregiver may recommend other treatments if you have a chronic cough. HOME CARE INSTRUCTIONS   Only take over-the-counter or prescription medicines for pain, discomfort, or fever as directed by your caregiver. Use cough suppressants only as directed by your caregiver.  Use a cold steam vaporizer or humidifier in your bedroom or home to help loosen secretions.  Sleep in a semi-upright position if your cough is worse at night.  Rest as needed.  Stop smoking if you smoke. SEEK IMMEDIATE MEDICAL CARE IF:   You have pus in your sputum.  Your cough starts to worsen.  You cannot control your cough with suppressants and are losing sleep.  You begin coughing up  blood.  You have difficulty breathing.  You develop pain which is getting worse or is uncontrolled with medicine.  You have a fever. MAKE SURE YOU:   Understand these instructions.  Will watch your condition.  Will get help right away if you are not doing well or get worse. Document Released: 06/17/2010 Document Revised: 03/13/2011 Document Reviewed: 06/17/2010 [facility] Patient Information 2015 [place], [place]. This information is not intended to replace advice given to you by your health care provider. Make sure you discuss any questions you have with your health care provider.

## 2014-07-02 NOTE — ED Notes (Signed)
Awoke with chills, denies pain.

## 2014-07-02 NOTE — ED Provider Notes (Signed)
CSN: [id number]     Arrival date & time 07/02/14  0156 History   First MD Initiated Contact with Patient 07/02/14 0159     Chief Complaint  Patient presents with  . Chills     (Consider location/radiation/quality/duration/timing/severity/associated sxs/prior Treatment) HPI  This a 59 year old male with history of coronary artery disease, hypercholesterolemia, current smoking who presents with chills. Patient reports that he woke up with intense chills.  Prior to going to bed, he felt well. No recent fevers. He reports a nonproductive cough. He also reports that during the time that he felt chilled he felt very short of breath. He took several puffs in of an inhaler with improvement. Denies any nausea, vomiting, abdominal pain, urinary symptoms.  Past Medical History  Diagnosis Date  . Coronary atherosclerosis of native coronary artery     BMS LAD 1/13, LVEF 55-60%  . MI (myocardial infarction)     AMI 1/13  . Hypercholesteremia    Past Surgical History  Procedure Laterality Date  . Coronary stent placement    . Colonoscopy  11/14/2006    Mottled around pedunculated polyp suspicious for carcinoid removed   cleanly with the hot snare likely represents cause of the patient's  hematochezia/  5 mm middescending and ascending colon polyps removed with cold snare technique, right sided diverticula.  Remainder of colonic mucosa appeared normal. Rectum path unremarkable. Desc colon polyp adenomatous. Next TCS 11/2011.   . Cardiac catheterization    . Colonoscopy  10/18/2011    Colonic diverticulosis. Colonic polyp as removed as described above   Family History  Problem Relation Age of Onset  . Diabetes type II Father   . Coronary artery disease Father   . Heart attack Father   . Diabetes type II Mother   . Breast cancer Mother   . Colon cancer Neg Hx    History  Substance Use Topics  . Smoking status: Current Every Day Smoker -- 1.00 packs/day for 40 years    Types: Cigarettes  .  Smokeless tobacco: Never Used  . Alcohol Use: No    Review of Systems  Constitutional: Positive for fever and chills.  Respiratory: Positive for cough and shortness of breath. Negative for chest tightness.   Cardiovascular: Negative.  Negative for chest pain.  Gastrointestinal: Negative.  Negative for nausea, vomiting and abdominal pain.  Genitourinary: Negative.  Negative for dysuria.  Skin: Negative for rash.  Neurological: Negative for headaches.  All other systems reviewed and are negative.     Allergies  Review of patient's allergies indicates no known allergies.  Home Medications   Prior to Admission medications   Medication Sig Start Date End Date Taking? Authorizing Provider  albuterol (PROVENTIL HFA;VENTOLIN HFA) 108 (90 BASE) MCG/ACT inhaler Inhale 1-2 puffs into the lungs every 6 (six) hours as needed for wheezing. 12/16/11  Yes Fredia Sorrow, MD  aspirin 81 MG tablet Take 81 mg by mouth daily.   Yes Historical Provider, MD  atorvastatin (LIPITOR) 40 MG tablet TAKE 1 TABLET BY MOUTH DAILY. 02/02/12  Yes Satira Sark, MD  cloNIDine (CATAPRES) 0.1 MG tablet TAKE 1 TABLET BY MOUTH 2 TIMES DAILY. 02/07/12  Yes Satira Sark, MD  losartan (COZAAR) 50 MG tablet Take 1 tablet (50 mg total) by mouth daily. 07/11/11 07/02/14 Yes Lendon Colonel, NP  nitroGLYCERIN (NITROLINGUAL) 0.4 MG/SPRAY spray Place 1 spray under the tongue every 5 (five) minutes as needed. For chest pain.   Yes Historical Provider, MD  azithromycin [facility])  250 MG tablet Take 1 tablet (250 mg total) by mouth daily. Take first 2 tablets together, then 1 every day until finished. 07/02/14   Merryl Hacker, MD  ciprofloxacin (CIPRO) 500 MG tablet Take 1 tablet (500 mg total) by mouth 2 (two) times daily. 10/28/11   Adlih Moreno-Coll, MD  clopidogrel (PLAVIX) 75 MG tablet TAKE 1 TABLET BY MOUTH DAILY. 02/02/12   Satira Sark, MD  Dextromethorphan-Guaifenesin [facility] DM) 30-600 MG TB12 Take 1  tablet by mouth every 12 (twelve) hours. 12/16/11   Fredia Sorrow, MD  metoprolol succinate (TOPROL-XL) 50 MG 24 hr tablet Take 1 tablet (50 mg total) by mouth daily. Take with or immediately following a meal. Patient taking differently: Take 25 mg by mouth daily. Take with or immediately following a meal. 07/03/11 07/02/12  Lendon Colonel, NP  oseltamivir (TAMIFLU) 75 MG capsule Take 1 capsule (75 mg total) by mouth every 12 (twelve) hours. 12/16/11   Fredia Sorrow, MD  predniSONE (DELTASONE) 20 MG tablet Take 2 tablets (40 mg total) by mouth daily with breakfast. 07/02/14   Merryl Hacker, MD   BP 140/95 mmHg  Pulse 95  Temp(Src) 102.5 F (39.2 C) (Rectal)  Resp 23  Ht 5\' 2"  (1.575 m)  Wt 160 lb (72.576 kg)  BMI 29.26 kg/m2  SpO2 98% Physical Exam  Constitutional: He is oriented to person, place, and time. He appears well-developed and well-nourished. No distress.  HENT:  Head: Normocephalic and atraumatic.  Eyes: Pupils are equal, round, and reactive to light.  Neck: Neck supple.  Cardiovascular: Normal rate, regular rhythm and normal heart sounds.   No murmur heard. Pulmonary/Chest: Effort normal. No respiratory distress. He has wheezes.  Good air movement, scant wheezing  Abdominal: Soft. Bowel sounds are normal. There is no tenderness. There is no rebound.  Musculoskeletal: He exhibits no edema.  Neurological: He is alert and oriented to person, place, and time.  Skin: Skin is warm and dry.  Psychiatric: He has a normal mood and affect.  Nursing note and vitals reviewed.   ED Course  Procedures (including critical care time) Labs Review Labs Reviewed  CBC WITH DIFFERENTIAL/PLATELET - Abnormal; Notable for the following:    Neutrophils Relative % 88 (*)    Lymphocytes Relative 10 (*)    Monocytes Relative 1 (*)    All other components within normal limits  BASIC METABOLIC PANEL - Abnormal; Notable for the following:    Creatinine, Ser 1.26 (*)    Anion gap 4 (*)     All other components within normal limits  URINALYSIS, ROUTINE W REFLEX MICROSCOPIC (NOT AT [facility]) - Abnormal; Notable for the following:    Hgb urine dipstick SMALL (*)    All other components within normal limits  URINE CULTURE  TROPONIN I  BRAIN NATRIURETIC PEPTIDE  LACTIC ACID, PLASMA  URINE MICROSCOPIC-ADD ON  LACTIC ACID, PLASMA    Imaging Review Dg Chest 2 View  07/02/2014   CLINICAL DATA:  Chills  EXAM: CHEST  2 VIEW  COMPARISON:  03/18/2012  FINDINGS: The heart size and mediastinal contours are within normal limits. Both lungs are clear. The visualized skeletal structures are unremarkable.  IMPRESSION: No active cardiopulmonary disease.   Electronically Signed   By: Andreas Newport M.D.   On: 07/02/2014 03:07     EKG Interpretation   Date/Time:  Thursday July 02 2014 02:04:25 EDT Ventricular Rate:  93 PR Interval:  147 QRS Duration: 74 QT Interval:  316 QTC  Calculation: 393 R Axis:   59 Text Interpretation:  Sinus rhythm Confirmed by Kobie Matkins  MD, Yiannis Tulloch  ([id number]) on 07/02/2014 2:24:42 AM      MDM   Final diagnoses:  Fever, unspecified fever cause  Cough    Patient presents with chills, cough, and shortness of breath. Nontoxic-appearing. Satting 97% on room air. Scant wheezing on exam but otherwise exam is reassuring. Rectal temperature 102.5. Lab work including lactate obtained and is reassuring. No evidence of leukocytosis. No other signs or symptoms of sepsis. Urinalysis and chest x-ray normal. Patient improved with fluids and Tylenol. Suspect early upper respiratory infection versus early pneumonia. Given that he is a smoker, patient will be placed on steroid-induced. He will also be given azithromycin for possible early pneumonia given clinical picture. Follow-up with primary physician in one to 2 days.  After history, exam, and medical workup I feel the patient has been appropriately medically screened and is safe for discharge home. Pertinent diagnoses were  discussed with the patient. Patient was given return precautions.     Merryl Hacker, MD 07/02/14 [phone]

## 2014-07-03 DIAGNOSIS — J40 Bronchitis, not specified as acute or chronic: Secondary | ICD-10-CM | POA: Diagnosis not present

## 2014-07-03 DIAGNOSIS — F17200 Nicotine dependence, unspecified, uncomplicated: Secondary | ICD-10-CM | POA: Diagnosis not present

## 2014-07-03 DIAGNOSIS — I2510 Atherosclerotic heart disease of native coronary artery without angina pectoris: Secondary | ICD-10-CM | POA: Diagnosis not present

## 2014-07-03 LAB — URINE CULTURE

## 2016-05-19 ENCOUNTER — Emergency Department (HOSPITAL_COMMUNITY): Payer: Medicare Other

## 2016-05-19 ENCOUNTER — Observation Stay (HOSPITAL_COMMUNITY): Payer: Medicare Other

## 2016-05-19 ENCOUNTER — Observation Stay (HOSPITAL_COMMUNITY)
Admission: EM | Admit: 2016-05-19 | Discharge: 2016-05-20 | Disposition: A | Discharge status: Home / Self Care | Payer: Medicare Other | Attending: Family Medicine | Admitting: Family Medicine

## 2016-05-19 ENCOUNTER — Encounter (HOSPITAL_COMMUNITY): Payer: Self-pay

## 2016-05-19 DIAGNOSIS — I10 Essential (primary) hypertension: Secondary | ICD-10-CM | POA: Diagnosis present

## 2016-05-19 DIAGNOSIS — F17200 Nicotine dependence, unspecified, uncomplicated: Secondary | ICD-10-CM | POA: Diagnosis present

## 2016-05-19 DIAGNOSIS — H53133 Sudden visual loss, bilateral: Secondary | ICD-10-CM | POA: Diagnosis not present

## 2016-05-19 DIAGNOSIS — R519 Headache, unspecified: Secondary | ICD-10-CM

## 2016-05-19 DIAGNOSIS — R51 Headache: Secondary | ICD-10-CM | POA: Diagnosis not present

## 2016-05-19 DIAGNOSIS — H547 Unspecified visual loss: Secondary | ICD-10-CM | POA: Diagnosis not present

## 2016-05-19 DIAGNOSIS — Z79899 Other long term (current) drug therapy: Secondary | ICD-10-CM | POA: Insufficient documentation

## 2016-05-19 DIAGNOSIS — Z5181 Encounter for therapeutic drug level monitoring: Secondary | ICD-10-CM | POA: Diagnosis not present

## 2016-05-19 DIAGNOSIS — H543 Unqualified visual loss, both eyes: Secondary | ICD-10-CM

## 2016-05-19 DIAGNOSIS — I2510 Atherosclerotic heart disease of native coronary artery without angina pectoris: Secondary | ICD-10-CM | POA: Diagnosis not present

## 2016-05-19 DIAGNOSIS — F17210 Nicotine dependence, cigarettes, uncomplicated: Secondary | ICD-10-CM | POA: Insufficient documentation

## 2016-05-19 LAB — COMPREHENSIVE METABOLIC PANEL
ALBUMIN: 3.8 g/dL (ref 3.5–5.0)
ALT: 17 U/L (ref 17–63)
AST: 20 U/L (ref 15–41)
Alkaline Phosphatase: 62 U/L (ref 38–126)
Anion gap: 8 (ref 5–15)
BUN: 9 mg/dL (ref 6–20)
CO2: 27 mmol/L (ref 22–32)
CREATININE: 1.31 mg/dL — AB (ref 0.61–1.24)
Calcium: 9.4 mg/dL (ref 8.9–10.3)
Chloride: 104 mmol/L (ref 101–111)
GFR calc Af Amer: >60 mL/min (ref >60)
GFR calc non Af Amer: 57 mL/min — ABNORMAL LOW (ref >60)
Glucose, Bld: 85 mg/dL (ref 65–99)
Potassium: 3.6 mmol/L (ref 3.5–5.1)
SODIUM: 139 mmol/L (ref 135–145)
Total Bilirubin: 0.6 mg/dL (ref 0.3–1.2)
Total Protein: 7.2 g/dL (ref 6.5–8.1)

## 2016-05-19 LAB — CBC
HEMATOCRIT: 41.6 % (ref 39.0–52.0)
Hemoglobin: 14.4 g/dL (ref 13.0–17.0)
MCH: 29.6 pg (ref 26.0–34.0)
MCHC: 34.6 g/dL (ref 30.0–36.0)
MCV: 85.6 fL (ref 78.0–100.0)
PLATELETS: 328 10*3/uL (ref 150–400)
RBC: 4.86 MIL/uL (ref 4.22–5.81)
RDW: 14 % (ref 11.5–15.5)
WBC: 8.4 10*3/uL (ref 4.0–10.5)

## 2016-05-19 LAB — DIFFERENTIAL
Basophils Absolute: 0 10*3/uL (ref 0.0–0.1)
Basophils Relative: 1 %
Eosinophils Absolute: 0.2 10*3/uL (ref 0.0–0.7)
Eosinophils Relative: 2 %
Lymphocytes Relative: 34 %
Lymphs Abs: 2.9 10*3/uL (ref 0.7–4.0)
Monocytes Absolute: 1.3 10*3/uL — ABNORMAL HIGH (ref 0.1–1.0)
Monocytes Relative: 16 %
NEUTROS ABS: 4 10*3/uL (ref 1.7–7.7)
NEUTROS PCT: 47 %

## 2016-05-19 LAB — TROPONIN I: Troponin I: <0.03 ng/mL (ref <0.03)

## 2016-05-19 LAB — I-STAT TROPONIN, ED: Troponin i, poc: 0.01 ng/mL (ref 0.00–0.08)

## 2016-05-19 LAB — ETHANOL: Alcohol, Ethyl (B): <5 mg/dL (ref <5)

## 2016-05-19 LAB — PROTIME-INR
INR: 0.99
PROTHROMBIN TIME: 13.1 s (ref 11.4–15.2)

## 2016-05-19 LAB — I-STAT CHEM 8, ED
BUN: 9 mg/dL (ref 6–20)
Calcium, Ion: 1.21 mmol/L (ref 1.15–1.40)
Chloride: 102 mmol/L (ref 101–111)
Creatinine, Ser: 1.2 mg/dL (ref 0.61–1.24)
Glucose, Bld: 85 mg/dL (ref 65–99)
HCT: 42 % (ref 39.0–52.0)
Hemoglobin: 14.3 g/dL (ref 13.0–17.0)
Potassium: 3.6 mmol/L (ref 3.5–5.1)
Sodium: 141 mmol/L (ref 135–145)
TCO2: 28 mmol/L (ref 0–100)

## 2016-05-19 LAB — APTT: APTT: 33 s (ref 24–36)

## 2016-05-19 MED ORDER — CLOPIDOGREL BISULFATE 75 MG PO TABS
75.0000 mg | ORAL_TABLET | Freq: Every day | ORAL | Status: DC
  Administered 2016-05-19 – 2016-05-20 (×2): 75 mg via ORAL
  Filled 2016-05-19 (×2): qty 1

## 2016-05-19 MED ORDER — ACETAMINOPHEN 325 MG PO TABS: 650.0000 mg | ORAL_TABLET | Freq: Four times a day (QID) | ORAL | Status: DC | PRN

## 2016-05-19 MED ORDER — STROKE: EARLY STAGES OF RECOVERY BOOK
Freq: Once | Status: DC
  Filled 2016-05-19: qty 1

## 2016-05-19 MED ORDER — LOSARTAN POTASSIUM 50 MG PO TABS
50.0000 mg | ORAL_TABLET | Freq: Every day | ORAL | Status: DC
  Administered 2016-05-20: 50 mg via ORAL
  Filled 2016-05-19: qty 1

## 2016-05-19 MED ORDER — ASPIRIN 325 MG PO TABS
325.0000 mg | ORAL_TABLET | Freq: Every day | ORAL | Status: DC
  Administered 2016-05-19 – 2016-05-20 (×2): 325 mg via ORAL
  Filled 2016-05-19 (×2): qty 1

## 2016-05-19 MED ORDER — METOPROLOL SUCCINATE ER 50 MG PO TB24
50.0000 mg | ORAL_TABLET | Freq: Every day | ORAL | Status: DC
  Administered 2016-05-20: 50 mg via ORAL
  Filled 2016-05-19: qty 1

## 2016-05-19 MED ORDER — ASPIRIN 300 MG RE SUPP: 300.0000 mg | Freq: Every day | RECTAL | Status: DC

## 2016-05-19 MED ORDER — ATORVASTATIN CALCIUM 40 MG PO TABS
40.0000 mg | ORAL_TABLET | Freq: Every day | ORAL | Status: DC
  Administered 2016-05-20: 40 mg via ORAL
  Filled 2016-05-19: qty 1

## 2016-05-19 MED ORDER — CLONIDINE HCL 0.1 MG PO TABS
0.1000 mg | ORAL_TABLET | Freq: Two times a day (BID) | ORAL | Status: DC
  Administered 2016-05-19 – 2016-05-20 (×2): 0.1 mg via ORAL
  Filled 2016-05-19 (×2): qty 1

## 2016-05-19 NOTE — ED Notes (Signed)
ED Provider at bedside. 
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
## 2016-05-19 NOTE — ED Notes (Signed)
Pt to go to room 303 following MRI and U/S.

## 2016-05-19 NOTE — Progress Notes (Signed)
According to the charting the patient passed his swallow screen in the ED.  So I will make sure he gets his ordered diet.
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
## 2016-05-20 ENCOUNTER — Observation Stay (HOSPITAL_BASED_OUTPATIENT_CLINIC_OR_DEPARTMENT_OTHER): Payer: Medicare Other

## 2016-05-20 DIAGNOSIS — R51 Headache: Secondary | ICD-10-CM | POA: Diagnosis not present

## 2016-05-20 DIAGNOSIS — I159 Secondary hypertension, unspecified: Secondary | ICD-10-CM | POA: Diagnosis not present

## 2016-05-20 DIAGNOSIS — H547 Unspecified visual loss: Secondary | ICD-10-CM | POA: Diagnosis not present

## 2016-05-20 DIAGNOSIS — H543 Unqualified visual loss, both eyes: Secondary | ICD-10-CM | POA: Diagnosis not present

## 2016-05-20 DIAGNOSIS — F17200 Nicotine dependence, unspecified, uncomplicated: Secondary | ICD-10-CM | POA: Diagnosis not present

## 2016-05-20 LAB — LIPID PANEL
Cholesterol: 123 mg/dL (ref 0–200)
HDL: 33 mg/dL — AB (ref >40)
LDL Cholesterol: 71 mg/dL (ref 0–99)
TRIGLYCERIDES: 93 mg/dL (ref <150)
Total CHOL/HDL Ratio: 3.7 RATIO
VLDL: 19 mg/dL (ref 0–40)

## 2016-05-20 LAB — ECHOCARDIOGRAM COMPLETE
Height: 62 in
Weight: 2628.8 oz

## 2016-05-20 LAB — TROPONIN I: Troponin I: <0.03 ng/mL (ref <0.03)

## 2016-05-20 MED ORDER — ASPIRIN 325 MG PO TBEC: 81.0000 mg | DELAYED_RELEASE_TABLET | Freq: Every day | ORAL | Status: DC

## 2016-05-20 NOTE — Progress Notes (Signed)
Patient discharged with instructions, prescription, and care notes.  Verbalized understanding via teach back.  IV was removed and the site was WNL. Patient voiced no further complaints or concerns at the time of discharge.  Appointments scheduled per instructions.  Patient left the floor via w/c family  And staff in stable condition. 

## 2016-05-20 NOTE — Progress Notes (Signed)
*  PRELIMINARY RESULTS* Echocardiogram 2D Echocardiogram has been performed.  Samuel Germany 05/20/2016, 10:56 AM

## 2016-05-21 LAB — HEMOGLOBIN A1C
HEMOGLOBIN A1C: 5.9 % — AB (ref 4.8–5.6)
Mean Plasma Glucose: 123 mg/dL
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
## 2016-09-14 ENCOUNTER — Encounter: Payer: Self-pay | Admitting: Internal Medicine

## 2016-09-19 ENCOUNTER — Telehealth: Payer: Self-pay

## 2016-09-19 NOTE — Telephone Encounter (Signed)
Patient called because he received his letter to schedule his 5 yr tcs and patient stated he had it done in march at the [place]

## 2016-09-25 NOTE — Telephone Encounter (Signed)
PT said he had a colonoscopy done in March at the [place]. He cannot remember if he had polyps this time or not. He will have them send a copy to [place] for our records. He has been having a lot of problems with his heart and has to go to the [place] a lot.

## 2016-09-25 NOTE — Telephone Encounter (Signed)
Thanks

## 2018-03-12 ENCOUNTER — Emergency Department (HOSPITAL_COMMUNITY)
Admission: EM | Admit: 2018-03-12 | Discharge: 2018-03-12 | Disposition: A | Discharge status: Home / Self Care | Payer: Medicare Other | Attending: Emergency Medicine | Admitting: Emergency Medicine

## 2018-03-12 ENCOUNTER — Encounter (HOSPITAL_COMMUNITY): Payer: Self-pay | Admitting: *Deleted

## 2018-03-12 ENCOUNTER — Other Ambulatory Visit: Payer: Self-pay

## 2018-03-12 ENCOUNTER — Emergency Department (HOSPITAL_COMMUNITY): Payer: Medicare Other

## 2018-03-12 DIAGNOSIS — R0789 Other chest pain: Secondary | ICD-10-CM | POA: Insufficient documentation

## 2018-03-12 DIAGNOSIS — J9801 Acute bronchospasm: Secondary | ICD-10-CM | POA: Diagnosis not present

## 2018-03-12 DIAGNOSIS — I10 Essential (primary) hypertension: Secondary | ICD-10-CM | POA: Diagnosis not present

## 2018-03-12 DIAGNOSIS — Z79899 Other long term (current) drug therapy: Secondary | ICD-10-CM | POA: Diagnosis not present

## 2018-03-12 DIAGNOSIS — F17210 Nicotine dependence, cigarettes, uncomplicated: Secondary | ICD-10-CM | POA: Insufficient documentation

## 2018-03-12 DIAGNOSIS — I252 Old myocardial infarction: Secondary | ICD-10-CM | POA: Insufficient documentation

## 2018-03-12 DIAGNOSIS — Z7902 Long term (current) use of antithrombotics/antiplatelets: Secondary | ICD-10-CM | POA: Insufficient documentation

## 2018-03-12 DIAGNOSIS — R079 Chest pain, unspecified: Secondary | ICD-10-CM | POA: Diagnosis present

## 2018-03-12 DIAGNOSIS — Z7982 Long term (current) use of aspirin: Secondary | ICD-10-CM | POA: Diagnosis not present

## 2018-03-12 HISTORY — DX: Essential (primary) hypertension: I10

## 2018-03-12 LAB — BASIC METABOLIC PANEL
Anion gap: 7 (ref 5–15)
BUN: 20 mg/dL (ref 8–23)
CO2: 26 mmol/L (ref 22–32)
CREATININE: 1.44 mg/dL — AB (ref 0.61–1.24)
Calcium: 9 mg/dL (ref 8.9–10.3)
Chloride: 105 mmol/L (ref 98–111)
GFR, EST AFRICAN AMERICAN: 60 mL/min — AB (ref >60)
GFR, EST NON AFRICAN AMERICAN: 52 mL/min — AB (ref >60)
Glucose, Bld: 118 mg/dL — ABNORMAL HIGH (ref 70–99)
POTASSIUM: 3.4 mmol/L — AB (ref 3.5–5.1)
SODIUM: 138 mmol/L (ref 135–145)

## 2018-03-12 LAB — POCT I-STAT TROPONIN I: TROPONIN I, POC: 0.01 ng/mL (ref 0.00–0.08)

## 2018-03-12 LAB — CBC
HCT: 43.8 % (ref 39.0–52.0)
Hemoglobin: 14.3 g/dL (ref 13.0–17.0)
MCH: 28.8 pg (ref 26.0–34.0)
MCHC: 32.6 g/dL (ref 30.0–36.0)
MCV: 88.1 fL (ref 80.0–100.0)
NRBC: 0 % (ref 0.0–0.2)
Platelets: 350 10*3/uL (ref 150–400)
RBC: 4.97 MIL/uL (ref 4.22–5.81)
RDW: 13.1 % (ref 11.5–15.5)
WBC: 7.2 10*3/uL (ref 4.0–10.5)

## 2018-03-12 MED ORDER — SODIUM CHLORIDE 0.9% FLUSH: 3.0000 mL | Freq: Once | INTRAVENOUS | Status: DC

## 2018-03-12 MED ORDER — PREDNISONE 20 MG PO TABS: ORAL_TABLET | ORAL | 0 refills | Status: DC

## 2018-03-12 MED ORDER — METHOCARBAMOL 500 MG PO TABS
500.0000 mg | ORAL_TABLET | Freq: Once | ORAL | Status: AC
  Administered 2018-03-12: 500 mg via ORAL
  Filled 2018-03-12: qty 1

## 2018-03-12 MED ORDER — METHYLPREDNISOLONE SODIUM SUCC 125 MG IJ SOLR
125.0000 mg | Freq: Once | INTRAMUSCULAR | Status: AC
Start: 2018-03-12 — End: 2018-03-12
  Administered 2018-03-12: 125 mg via INTRAVENOUS
  Filled 2018-03-12: qty 2

## 2018-03-12 MED ORDER — ALBUTEROL SULFATE (2.5 MG/3ML) 0.083% IN NEBU: 5.0000 mg | INHALATION_SOLUTION | Freq: Once | RESPIRATORY_TRACT | Status: DC

## 2018-03-12 MED ORDER — ALBUTEROL (5 MG/ML) CONTINUOUS INHALATION SOLN
10.0000 mg/h | INHALATION_SOLUTION | Freq: Once | RESPIRATORY_TRACT | Status: AC
  Administered 2018-03-12: 10 mg/h via RESPIRATORY_TRACT
  Filled 2018-03-12: qty 20

## 2018-03-12 MED ORDER — DOXYCYCLINE HYCLATE 100 MG PO CAPS: 100.0000 mg | ORAL_CAPSULE | Freq: Two times a day (BID) | ORAL | 0 refills | Status: DC

## 2018-03-12 MED ORDER — METHOCARBAMOL 500 MG PO TABS: 500.0000 mg | ORAL_TABLET | Freq: Four times a day (QID) | ORAL | 0 refills | Status: DC | PRN

## 2018-03-12 MED FILL — DOXYCYCLINE HYC 100 MG CAPS: 100 | 10 days supply | Qty: 20 | Fill #0

## 2018-03-12 MED FILL — METHOCARBAMOL 500 MG TABS: 500 | 5 days supply | Qty: 20 | Fill #0

## 2018-03-12 MED FILL — predniSONE 20 MG TABS: 20 | 9 days supply | Qty: 18 | Fill #0

## 2018-03-12 NOTE — ED Triage Notes (Signed)
Pt c/o midsternal chest pain that started x one day ago while walking around; pt c/o sob

## 2018-03-12 NOTE — ED Notes (Signed)
Pt refusing second neb tx. Pt requesting d/c papers. EDP notified.

## 2018-03-12 NOTE — ED Notes (Signed)
RT notified for neb tx. 

## 2018-03-12 NOTE — Discharge Instructions (Addendum)
Use ice and heat on your chest for comfort. Take the medications as prescribed. Use your inhaler for your wheezing and shortness of breath.  Take the antibiotic until gone.  Recheck if you get a high fever, your breathing gets worse, or your chest pain gets severe.

## 2018-03-12 NOTE — ED Provider Notes (Signed)
[facility] EMERGENCY DEPARTMENT Provider Note   CSN: [id number] Arrival date & time: 03/12/18  0218  Time seen 4:25 AM  History   Chief Complaint Chief Complaint  Patient presents with  . Chest Pain    HPI Trevor Key is a 63 y.o. male.     HPI patient states the evening of March 9 before dark he started getting the left lower chest pain while he was walking that he describes as sharp.  The pain has been there constantly.  It hurts more with movement and coughing.  He states nothing makes it feel better and states he has tried "everything".  But he cannot tell me one thing he has tried except to lay still and to use his nitroglycerin spray twice without relief.  He states he feels short of breath.  He denies diaphoresis, nausea, or vomiting.  He is unsure if he has had this pain before but he thinks he has.  He does not know the diagnosis.  He states he normally walks every day so that was not unusual.  He states he did have a stent placed in 2013 and he had an MI at that time.  He continues to smoke.  His cardiologist is at the [facility] in [place].  PCP [facility] in [place]  Past Medical History:  Diagnosis Date  . Coronary atherosclerosis of native coronary artery    BMS LAD 1/13, LVEF 55-60%  . Hypercholesteremia   . Hypertension   . MI (myocardial infarction) ([place])    AMI 1/13    Patient Active Problem List   Diagnosis Date Noted  . Vision loss 05/19/2016  . H/O adenomatous polyp of colon 10/10/2011  . Hypertension 07/03/2011  . Coronary atherosclerosis of native coronary artery 01/23/2011  . Mixed hyperlipidemia 01/23/2011  . Tobacco use disorder 01/23/2011    Past Surgical History:  Procedure Laterality Date  . CARDIAC CATHETERIZATION    . COLONOSCOPY  11/14/2006   Mottled around pedunculated polyp suspicious for carcinoid removed   cleanly with the hot snare likely represents cause of the patient's  hematochezia/  5 mm middescending and ascending colon polyps removed with  cold snare technique, right sided diverticula.  Remainder of colonic mucosa appeared normal. Rectum path unremarkable. Desc colon polyp adenomatous. Next TCS 11/2011.   Marland Kitchen COLONOSCOPY  10/18/2011   Colonic diverticulosis. Colonic polyp as removed as described above  . CORONARY STENT PLACEMENT          Home Medications    Prior to Admission medications   Medication Sig Start Date End Date Taking? Authorizing Provider  albuterol (PROVENTIL HFA;VENTOLIN HFA) 108 (90 BASE) MCG/ACT inhaler Inhale 1-2 puffs into the lungs every 6 (six) hours as needed for wheezing. 12/16/11   Fredia Sorrow, MD  aspirin EC 325 MG EC tablet Take 1 tablet (325 mg total) by mouth daily. 05/20/16   Eber Jones, MD  atorvastatin (LIPITOR) 40 MG tablet TAKE 1 TABLET BY MOUTH DAILY. 02/02/12   Satira Sark, MD  Cholecalciferol (VITAMIN D PO) Take 1 tablet by mouth daily.    [provider]  cloNIDine (CATAPRES) 0.1 MG tablet TAKE 1 TABLET BY MOUTH 2 TIMES DAILY. 02/07/12   Satira Sark, MD  clopidogrel (PLAVIX) 75 MG tablet TAKE 1 TABLET BY MOUTH DAILY. 02/02/12   Satira Sark, MD  doxycycline (VIBRAMYCIN) 100 MG capsule Take 1 capsule (100 mg total) by mouth 2 (two) times daily. 03/12/18   Rolland Porter, MD  losartan (  COZAAR) 50 MG tablet Take 1 tablet (50 mg total) by mouth daily. 07/11/11 05/19/16  Lendon Colonel, NP  methocarbamol (ROBAXIN) 500 MG tablet Take 1 tablet (500 mg total) by mouth every 6 (six) hours as needed (muscle pain). 03/12/18   Rolland Porter, MD  metoprolol succinate (TOPROL-XL) 50 MG 24 hr tablet Take 1 tablet (50 mg total) by mouth daily. Take with or immediately following a meal. 07/03/11 05/19/16  Lendon Colonel, NP  nitroGLYCERIN (NITROLINGUAL) 0.4 MG/SPRAY spray Place 1 spray under the tongue every 5 (five) minutes as needed. For chest pain.    [provider]  predniSONE (DELTASONE) 20 MG tablet Take 3 po QD x 3d , then 2 po QD x 3d then 1 po QD x 3d  03/12/18   Rolland Porter, MD    Family History Family History  Problem Relation Age of Onset  . Diabetes type II Father   . Coronary artery disease Father   . Heart attack Father   . Diabetes type II Mother   . Breast cancer Mother   . Colon cancer Neg Hx     Social History Social History   Tobacco Use  . Smoking status: Current Every Day Smoker    Packs/day: 0.33    Years: 40.00    Pack years: 13.20    Types: Cigarettes  . Smokeless tobacco: Never Used  Substance Use Topics  . Alcohol use: No  . Drug use: No  Lives at home   Allergies   Patient has no known allergies.   Review of Systems Review of Systems  All other systems reviewed and are negative.    Physical Exam Updated Vital Signs BP 131/72   Pulse (!) 120   Temp 98.5 F (36.9 C) (Oral)   Resp (!) 25   Ht 5\' 2"  (1.575 m)   Wt 73 kg   SpO2 100%   BMI 29.45 kg/m   Vital signs normal except for tachycardia   Physical Exam Vitals signs and nursing note reviewed.  Constitutional:      General: He is not in acute distress.    Appearance: Normal appearance. He is well-developed. He is not ill-appearing or toxic-appearing.  HENT:     Head: Normocephalic and atraumatic.     Right Ear: External ear normal.     Left Ear: External ear normal.     Nose: Nose normal. No mucosal edema or rhinorrhea.     Mouth/Throat:     Dentition: No dental abscesses.     Pharynx: No uvula swelling.  Eyes:     Conjunctiva/sclera: Conjunctivae normal.     Pupils: Pupils are equal, round, and reactive to light.  Neck:     Musculoskeletal: Full passive range of motion without pain, normal range of motion and neck supple.  Cardiovascular:     Rate and Rhythm: Normal rate and regular rhythm.     Heart sounds: Normal heart sounds. No murmur. No friction rub. No gallop.   Pulmonary:     Effort: Pulmonary effort is normal. No respiratory distress.     Breath sounds: Wheezing present. No rhonchi or rales.     Comments:  Patient is having diffuse wheezing.  He states he has an inhaler however he did not use it tonight.  Patient is very tender in his left anterior lower chest wall in the same area that he states he is having pain. Chest:     Chest wall: Tenderness present. No crepitus.  Abdominal:     General: Bowel sounds are normal. There is no distension.     Palpations: Abdomen is soft.     Tenderness: There is no abdominal tenderness. There is no guarding or rebound.  Musculoskeletal: Normal range of motion.        General: No tenderness.     Comments: Moves all extremities well.   Skin:    General: Skin is warm and dry.     Coloration: Skin is not pale.     Findings: No erythema or rash.  Neurological:     Mental Status: He is alert and oriented to person, place, and time.     Cranial Nerves: No cranial nerve deficit.  Psychiatric:        Mood and Affect: Mood is not anxious.        Speech: Speech normal.        Behavior: Behavior normal.      ED Treatments / Results  Labs (all labs ordered are listed, but only abnormal results are displayed) Results for orders placed or performed during the hospital encounter of 43/15/40  Basic metabolic panel  Result Value Ref Range   Sodium 138 135 - 145 mmol/L   Potassium 3.4 (L) 3.5 - 5.1 mmol/L   Chloride 105 98 - 111 mmol/L   CO2 26 22 - 32 mmol/L   Glucose, Bld 118 (H) 70 - 99 mg/dL   BUN 20 8 - 23 mg/dL   Creatinine, Ser 1.44 (H) 0.61 - 1.24 mg/dL   Calcium 9.0 8.9 - 10.3 mg/dL   GFR calc non Af Amer 52 (L) >60 mL/min   GFR calc Af Amer 60 (L) >60 mL/min   Anion gap 7 5 - 15  CBC  Result Value Ref Range   WBC 7.2 4.0 - 10.5 K/uL   RBC 4.97 4.22 - 5.81 MIL/uL   Hemoglobin 14.3 13.0 - 17.0 g/dL   HCT 43.8 39.0 - 52.0 %   MCV 88.1 80.0 - 100.0 fL   MCH 28.8 26.0 - 34.0 pg   MCHC 32.6 30.0 - 36.0 g/dL   RDW 13.1 11.5 - 15.5 %   Platelets 350 150 - 400 K/uL   nRBC 0.0 0.0 - 0.2 %  POCT i-Stat troponin I  Result Value Ref Range    Troponin i, poc 0.01 0.00 - 0.08 ng/mL   Comment 3           Laboratory interpretation all normal except renal insufficiency   EKG None   ED ECG REPORT   Date: 03/12/2018  Rate: 93  Rhythm: normal sinus rhythm  QRS Axis: left  Intervals: normal  ST/T Wave abnormalities: nonspecific ST/T changes  Conduction Disutrbances:none  Narrative Interpretation: electrode noise  Old EKG Reviewed: none available  I have personally reviewed the EKG tracing and agree with the computerized printout as noted.   Radiology Dg Chest 2 View  Result Date: 03/12/2018 CLINICAL DATA:  Midsternal chest pain and shortness of breath for 1 day. Current smoker. EXAM: CHEST - 2 VIEW COMPARISON:  07/02/2014 FINDINGS: Normal heart size and pulmonary vascularity. No focal airspace disease or consolidation in the lungs. No blunting of costophrenic angles. No pneumothorax. Mediastinal contours appear intact. Degenerative changes in the spine and shoulders. IMPRESSION: No active cardiopulmonary disease. Electronically Signed   By: Lucienne Capers M.D.   On: 03/12/2018 03:18    Procedures Procedures (including critical care time)  Medications Ordered in ED Medications  albuterol (PROVENTIL) (2.5 MG/3ML) 0.083%  nebulizer solution 5 mg (has no administration in time range)  albuterol (PROVENTIL,VENTOLIN) solution continuous neb (10 mg/hr Nebulization Given 03/12/18 0446)  methocarbamol (ROBAXIN) tablet 500 mg (500 mg Oral Given 03/12/18 0439)  methylPREDNISolone sodium succinate (SOLU-MEDROL) 125 mg/2 mL injection 125 mg (125 mg Intravenous Given 03/12/18 0439)     Initial Impression / Assessment and Plan / ED Course  I have reviewed the triage vital signs and the nursing notes.  Pertinent labs & imaging results that were available during my care of the patient were reviewed by me and considered in my medical decision making (see chart for details).       Patient's pain appears to be chest wall pain.  He was  given Robaxin and Solu-Medrol IV.  He is having wheezing and he was given an albuterol continuous nebulizer 10 mg.  Patient has a renal insufficiency otherwise I would given him Toradol or Motrin for his chest wall pain.  We discussed his test results.  Patient's troponin is normal.  He relates he has had chest pain constantly since last evening before dark.  It gets dark at 7:30 PM so he has had chest pain constantly since his blood was drawn around 3:30 AM.  Delta troponin is not needed.  Recheck at 6:50 AM patient states he feels much better.  He states his breathing is better and his chest pain is better.  When I listen to him he has improved air movement however he still has some diffuse wheezing.  He was given a single nebulizer.  7:38 AM patient refuses nebulizer and wants to be discharged.  Final Clinical Impressions(s) / ED Diagnoses   Final diagnoses:  Chest wall pain  Bronchospasm    ED Discharge Orders         Ordered    predniSONE (DELTASONE) 20 MG tablet     03/12/18 0704    methocarbamol (ROBAXIN) 500 MG tablet  Every 6 hours PRN     03/12/18 0704    doxycycline (VIBRAMYCIN) 100 MG capsule  2 times daily     03/12/18 1638         Plan discharge  Rolland Porter, MD, Barbette Or, MD 03/12/18 [phone]

## 2020-01-09 ENCOUNTER — Other Ambulatory Visit: Payer: Self-pay

## 2020-01-09 ENCOUNTER — Encounter (HOSPITAL_COMMUNITY): Payer: Self-pay

## 2020-01-09 ENCOUNTER — Encounter (HOSPITAL_COMMUNITY)
Admission: RE | Admit: 2020-01-09 | Discharge: 2020-01-09 | Disposition: A | Discharge status: Home / Self Care | Payer: No Typology Code available for payment source | Source: Ambulatory Visit | Attending: Registered Nurse | Admitting: Registered Nurse

## 2020-01-09 VITALS — BP 144/80 | HR 59 | Ht 62.0 in | Wt 168.4 lb

## 2020-01-09 DIAGNOSIS — R0600 Dyspnea, unspecified: Secondary | ICD-10-CM

## 2020-01-09 NOTE — Progress Notes (Signed)
Cardiac/Pulmonary Rehab Medication Review by a Pharmacist  Does the patient  feel that his/her medications are working for him/her?  yes  Has the patient been experiencing any side effects to the medications prescribed?  no  Does the patient measure his/her own blood pressure or blood glucose at home?  yes - runs high usually in 160s  Does the patient have any problems obtaining medications due to transportation or finances?   no  Understanding of regimen: good Understanding of indications: good Potential of compliance: good  Questions asked to Determine Patient Understanding of Medication Regimen:  1. What is the name of the medication?  2. What is the medication used for?  3. When should it be taken?  4. How much should be taken?  5. How will you take it?  6. What side effects should you report?  Understanding Defined as: Excellent: All questions above are correct Good: Questions 1-4 are correct Fair: Questions 1-2 are correct  Poor: 1 or none of the above questions are correct   Pharmacist comments: Overall, patient has good understanding of medications and regimen.  Patient states his BP runs high usually in the 160s. Systolic BP ~622 today. MD's office is following and having patient take BP twice daily and plan to adjust as needed.   Will likely need increased doses or additional med.    Ramond Craver 01/09/2020 1:31 PM

## 2020-01-09 NOTE — Progress Notes (Signed)
Pulmonary Individual Treatment Plan  Patient Details  Name: Trevor Key MRN: [id number] Date of Birth: [birth year] Referring Provider:   Flowsheet Row PULMONARY REHAB OTHER RESP ORIENTATION from 01/09/2020 in [place]  Referring Provider Bobbye Charleston, NP      Initial Encounter Date:  [place] OTHER RESP ORIENTATION from 01/09/2020 in [place]  Date 01/09/20      Visit Diagnosis: Dyspnea, unspecified type  Patient's Home Medications on Admission:   Current Outpatient Medications:  .  aspirin 81 MG chewable tablet, Chew 81 mg by mouth daily., Disp: , Rfl:  .  atorvastatin (LIPITOR) 80 MG tablet, Take 80 mg by mouth daily., Disp: , Rfl:  .  cholecalciferol (VITAMIN D3) 25 MCG (1000 UNIT) tablet, Take 1,000 Units by mouth daily., Disp: , Rfl:  .  fluticasone furoate-vilanterol (BREO ELLIPTA) 100-25 MCG/INH AEPB, Inhale 1 puff into the lungs daily., Disp: , Rfl:  .  losartan (COZAAR) 100 MG tablet, Take 100 mg by mouth daily., Disp: , Rfl:  .  metoprolol tartrate (LOPRESSOR) 25 MG tablet, Take 25 mg by mouth 2 (two) times daily., Disp: , Rfl:  .  omeprazole (PRILOSEC) 20 MG capsule, Take 20 mg by mouth daily., Disp: , Rfl:  .  primidone (MYSOLINE) 50 MG tablet, Take 50 mg by mouth daily., Disp: , Rfl:  .  tiotropium (SPIRIVA) 18 MCG inhalation capsule, Place 18 mcg into inhaler and inhale daily., Disp: , Rfl:  .  albuterol (PROVENTIL HFA;VENTOLIN HFA) 108 (90 BASE) MCG/ACT inhaler, Inhale 1-2 puffs into the lungs every 6 (six) hours as needed for wheezing., Disp: 1 Inhaler, Rfl: 0 .  metoprolol succinate (TOPROL-XL) 50 MG 24 hr tablet, Take 1 tablet (50 mg total) by mouth daily. Take with or immediately following a meal., Disp: 90 tablet, Rfl: 3 .  nitroGLYCERIN (NITROLINGUAL) 0.4 MG/SPRAY spray, Place 1 spray under the tongue every 5 (five) minutes as needed. For chest pain., Disp: , Rfl:   Past Medical History: Past  Medical History:  Diagnosis Date  . Coronary atherosclerosis of native coronary artery    BMS LAD 1/13, LVEF 55-60%  . Hypercholesteremia   . Hypertension   . MI (myocardial infarction) ([place])    AMI 1/13    Tobacco Use: Social History   Tobacco Use  Smoking Status Current Every Day Smoker  . Packs/day: 0.25  . Years: 40.00  . Pack years: 10.00  . Types: Cigarettes  Smokeless Tobacco Never Used  Tobacco Comment   He is currently enrolled in a smoking cessation program through the [place]. He reports that it is working.    Labs: Recent Review Flowsheet Data    Labs for ITP Cardiac and Pulmonary Rehab Latest Ref Rng & Units 03/27/2011 10/28/2011 05/19/2016 05/20/2016   Cholestrol 0 - 200 mg/dL 118 - - 123   LDLCALC 0 - 99 mg/dL 66 - - 71   HDL >40 mg/dL 33(L) - - 33(L)   Trlycerides <150 mg/dL 96 - - 93   Hemoglobin A1c 4.8 - 5.6 % - - - 5.9(H)   TCO2 0 - 100 mmol/L - 26 28 -      Capillary Blood Glucose: No results found for: GLUCAP   Pulmonary Assessment Scores:  Pulmonary Assessment Scores    Row Name 01/09/20 1248         ADL UCSD   SOB Score total 48           CAT Score  CAT Score 22           mMRC Score   mMRC Score 3           UCSD: Self-administered rating of dyspnea associated with activities of daily living (ADLs) 6-point scale (0 = "not at all" to 5 = "maximal or unable to do because of breathlessness")  Scoring Scores range from 0 to 120.  Minimally important difference is 5 units  CAT: CAT can identify the health impairment of COPD patients and is better correlated with disease progression.  CAT has a scoring range of zero to 40. The CAT score is classified into four groups of low (less than 10), medium (10 - 20), high (21-30) and very high (31-40) based on the impact level of disease on health status. A CAT score over 10 suggests significant symptoms.  A worsening CAT score could be explained by an exacerbation, poor medication adherence, poor  inhaler technique, or progression of COPD or comorbid conditions.  CAT MCID is 2 points  mMRC: mMRC (Modified Medical Research Council) Dyspnea Scale is used to assess the degree of baseline functional disability in patients of respiratory disease due to dyspnea. No minimal important difference is established. A decrease in score of 1 point or greater is considered a positive change.   Pulmonary Function Assessment:  Pulmonary Function Assessment - 01/09/20 1306      Breath   Shortness of Breath Yes;Limiting activity           Exercise Target Goals: Exercise Program Goal: Individual exercise prescription set using results from initial 6 min walk test and THRR while considering  patient's activity barriers and safety.   Exercise Prescription Goal: Initial exercise prescription builds to 30-45 minutes a day of aerobic activity, 2-3 days per week.  Home exercise guidelines will be given to patient during program as part of exercise prescription that the participant will acknowledge.  Activity Barriers & Risk Stratification:  Activity Barriers & Cardiac Risk Stratification - 01/09/20 1305      Activity Barriers & Cardiac Risk Stratification   Activity Barriers Deconditioning;Shortness of Breath    Cardiac Risk Stratification High           6 Minute Walk:  6 Minute Walk    Row Name 01/09/20 1419         6 Minute Walk   Phase Initial     Distance 1400 feet     Walk Time 6 minutes     # of Rest Breaks 0     MPH 2.7     METS 3.39     RPE 9     Perceived Dyspnea  15     VO2 Peak 11.88     Symptoms Yes (comment)     Comments Complained of Shortness of Breath     Resting HR 59 bpm     Resting BP 144/80     Resting Oxygen Saturation  98 %     Exercise Oxygen Saturation  during 6 min walk 95 %     Max Ex. HR 102 bpm     Max Ex. BP 162/82     2 Minute Post BP 158/78           Interval HR   1 Minute HR 59     2 Minute HR 92     3 Minute HR 94     4 Minute HR 93      5 Minute HR 96  6 Minute HR 102     2 Minute Post HR 98     Interval Heart Rate? Yes           Interval Oxygen   Interval Oxygen? Yes     Baseline Oxygen Saturation % 98 %     1 Minute Oxygen Saturation % 95 %     1 Minute Liters of Oxygen 0 L     2 Minute Oxygen Saturation % 97 %     2 Minute Liters of Oxygen 0 L     3 Minute Oxygen Saturation % 97 %     3 Minute Liters of Oxygen 0 L     4 Minute Oxygen Saturation % 97 %     4 Minute Liters of Oxygen 0 L     5 Minute Oxygen Saturation % 97 %     5 Minute Liters of Oxygen 0 L     6 Minute Oxygen Saturation % 96 %     6 Minute Liters of Oxygen 0 L     2 Minute Post Oxygen Saturation % 98 %     2 Minute Post Liters of Oxygen 0 L            Oxygen Initial Assessment:  Oxygen Initial Assessment - 01/09/20 1305      Home Oxygen   Home Oxygen Device None    Sleep Oxygen Prescription CPAP    Liters per minute 0    Home Exercise Oxygen Prescription None    Home Resting Oxygen Prescription None    Compliance with Home Oxygen Use Yes           Oxygen Re-Evaluation:   Oxygen Discharge (Final Oxygen Re-Evaluation):   Initial Exercise Prescription:  Initial Exercise Prescription - 01/09/20 1400      Date of Initial Exercise RX and Referring Provider   Date 01/09/20    Referring Provider Bobbye Charleston, NP    Expected Discharge Date 05/13/20      Treadmill   MPH 1.6    Grade 0    Minutes 17      Recumbant Elliptical   Level 1    RPM 60    Minutes 22      Prescription Details   Frequency (times per week) 2    Duration Progress to 30 minutes of continuous aerobic without signs/symptoms of physical distress      Intensity   THRR 40-80% of Max Heartrate 62-125    Ratings of Perceived Exertion 11-13    Perceived Dyspnea 0-4      Progression   Progression Continue progressive overload as per policy without signs/symptoms or physical distress.      Resistance Training   Training Prescription Yes     Weight 3    Reps 10-15           Perform Capillary Blood Glucose checks as needed.  Exercise Prescription Changes:   Exercise Comments:   Exercise Goals and Review:  Exercise Goals    Row Name 01/09/20 1423             Exercise Goals   Increase Physical Activity Yes       Intervention Provide advice, education, support and counseling about physical activity/exercise needs.;Develop an individualized exercise prescription for aerobic and resistive training based on initial evaluation findings, risk stratification, comorbidities and participant's personal goals.       Expected Outcomes Short Term: Attend rehab on a regular basis to increase amount  of physical activity.;Long Term: Add in home exercise to make exercise part of routine and to increase amount of physical activity.;Long Term: Exercising regularly at least 3-5 days a week.       Increase Strength and Stamina Yes       Intervention Provide advice, education, support and counseling about physical activity/exercise needs.;Develop an individualized exercise prescription for aerobic and resistive training based on initial evaluation findings, risk stratification, comorbidities and participant's personal goals.       Expected Outcomes Short Term: Increase workloads from initial exercise prescription for resistance, speed, and METs.;Short Term: Perform resistance training exercises routinely during rehab and add in resistance training at home;Long Term: Improve cardiorespiratory fitness, muscular endurance and strength as measured by increased METs and functional capacity (6MWT)       Able to understand and use rate of perceived exertion (RPE) scale Yes       Intervention Provide education and explanation on how to use RPE scale       Expected Outcomes Short Term: Able to use RPE daily in rehab to express subjective intensity level;Long Term:  Able to use RPE to guide intensity level when exercising independently       Able to  understand and use Dyspnea scale Yes       Intervention Provide education and explanation on how to use Dyspnea scale       Expected Outcomes Short Term: Able to use Dyspnea scale daily in rehab to express subjective sense of shortness of breath during exertion;Long Term: Able to use Dyspnea scale to guide intensity level when exercising independently       Knowledge and understanding of Target Heart Rate Range (THRR) Yes       Intervention Provide education and explanation of THRR including how the numbers were predicted and where they are located for reference       Expected Outcomes Short Term: Able to state/look up THRR;Short Term: Able to use daily as guideline for intensity in rehab;Long Term: Able to use THRR to govern intensity when exercising independently       Able to check pulse independently Yes       Intervention Provide education and demonstration on how to check pulse in carotid and radial arteries.;Review the importance of being able to check your own pulse for safety during independent exercise       Expected Outcomes Short Term: Able to explain why pulse checking is important during independent exercise;Long Term: Able to check pulse independently and accurately       Understanding of Exercise Prescription Yes       Intervention Provide education, explanation, and written materials on patient's individual exercise prescription       Expected Outcomes Short Term: Able to explain program exercise prescription;Long Term: Able to explain home exercise prescription to exercise independently              Exercise Goals Re-Evaluation :   Discharge Exercise Prescription (Final Exercise Prescription Changes):   Nutrition:  Target Goals: Understanding of nutrition guidelines, daily intake of sodium 1500mg , cholesterol 200mg , calories 30% from fat and 7% or less from saturated fats, daily to have 5 or more servings of fruits and vegetables.  Biometrics:  Pre Biometrics - 01/09/20  1427      Pre Biometrics   Height 5\' 2"  (1.575 m)    Weight 168 lb 6.9 oz (76.4 kg)    Waist Circumference 38.5 inches    Hip Circumference 37.5 inches  Waist to Hip Ratio 1.03 %    BMI (Calculated) 30.8    Triceps Skinfold 15 mm    % Body Fat 28.7 %    Grip Strength 37.6 kg    Flexibility 10 in    Single Leg Stand 11.78 seconds            Nutrition Therapy Plan and Nutrition Goals:   Nutrition Assessments:  Nutrition Assessments - 01/09/20 1311      MEDFICTS Scores   Pre Score 63          MEDIFICTS Score Key:  ?70 Need to make dietary changes   40-70 Heart Healthy Diet  ? 40 Therapeutic Level Cholesterol Diet   Picture Your Plate Scores:  <26 Unhealthy dietary pattern with much room for improvement.  41-50 Dietary pattern unlikely to meet recommendations for good health and room for improvement.  51-60 More healthful dietary pattern, with some room for improvement.   >60 Healthy dietary pattern, although there may be some specific behaviors that could be improved.    Nutrition Goals Re-Evaluation:   Nutrition Goals Discharge (Final Nutrition Goals Re-Evaluation):   Psychosocial: Target Goals: Acknowledge presence or absence of significant depression and/or stress, maximize coping skills, provide positive support system. Participant is able to verbalize types and ability to use techniques and skills needed for reducing stress and depression.  Initial Review & Psychosocial Screening:  Initial Psych Review & Screening - 01/09/20 1259      Initial Review   Current issues with None Identified      Family Dynamics   Good Support System? Yes    Comments He has three sisters who check up on him frequently. His wife calls him multiple times per day to check up on him. His brother stops by to see him everyday after work. He sees his 3 children everyday. He has a very positive support system.      Barriers   Psychosocial barriers to participate in  program There are no identifiable barriers or psychosocial needs.      Screening Interventions   Interventions Encouraged to exercise    Expected Outcomes Long Term goal: The participant improves quality of Life and PHQ9 Scores as seen by post scores and/or verbalization of changes           Quality of Life Scores:  Quality of Life - 01/09/20 1432      Quality of Life   Select Quality of Life      Quality of Life Scores   Health/Function Pre 27.54 %    Socioeconomic Pre 27.75 %    Psych/Spiritual Pre 30 %    Family Pre 30 %    GLOBAL Pre 28.52 %          Scores of 19 and below usually indicate a poorer quality of life in these areas.  A difference of  2-3 points is a clinically meaningful difference.  A difference of 2-3 points in the total score of the Quality of Life Index has been associated with significant improvement in overall quality of life, self-image, physical symptoms, and general health in studies assessing change in quality of life.   PHQ-9: Recent Review Flowsheet Data    Depression screen [facility] 2/9 01/09/2020 02/07/2011   Decreased Interest 0 0   Down, Depressed, Hopeless 0 0   PHQ - 2 Score 0 0   Altered sleeping 1 -   Tired, decreased energy 1 -   Change in appetite 0 -  Feeling bad or failure about yourself  0 -   Trouble concentrating 0 -   Moving slowly or fidgety/restless 0 -   Suicidal thoughts 0 -   PHQ-9 Score 2 -   Difficult doing work/chores Not difficult at all -     Interpretation of Total Score  Total Score Depression Severity:  1-4 = Minimal depression, 5-9 = Mild depression, 10-14 = Moderate depression, 15-19 = Moderately severe depression, 20-27 = Severe depression   Psychosocial Evaluation and Intervention:  Psychosocial Evaluation - 01/09/20 1418      Psychosocial Evaluation & Interventions   Interventions Encouraged to exercise with the program and follow exercise prescription    Comments At orientation, patient has no identifiable  psychosocial issues. He reports that he has a great support system between his family and the [employer]. His PHQ-9 was a 2 and his QOL was a 28.52.    Expected Outcomes Patient will continue to not have any psychological issues for the duration of the program.    Continue Psychosocial Services  No Follow up required           Psychosocial Re-Evaluation:   Psychosocial Discharge (Final Psychosocial Re-Evaluation):    Education: Education Goals: Education classes will be provided on a weekly basis, covering required topics. Participant will state understanding/return demonstration of topics presented.  Learning Barriers/Preferences:  Learning Barriers/Preferences - 01/09/20 1303      Learning Barriers/Preferences   Learning Barriers None    Learning Preferences Skilled Demonstration           Education Topics: How Lungs Work and Diseases: - Discuss the anatomy of the lungs and diseases that can affect the lungs, such as COPD.   Exercise: -Discuss the importance of exercise, FITT principles of exercise, normal and abnormal responses to exercise, and how to exercise safely.   Environmental Irritants: -Discuss types of environmental irritants and how to limit exposure to environmental irritants.   Meds/Inhalers and oxygen: - Discuss respiratory medications, definition of an inhaler and oxygen, and the proper way to use an inhaler and oxygen.   Energy Saving Techniques: - Discuss methods to conserve energy and decrease shortness of breath when performing activities of daily living.    Bronchial Hygiene / Breathing Techniques: - Discuss breathing mechanics, pursed-lip breathing technique,  proper posture, effective ways to clear airways, and other functional breathing techniques   Cleaning Equipment: - Provides group verbal and written instruction about the health risks of elevated stress, cause of high stress, and healthy ways to reduce  stress.   Nutrition I: Fats: - Discuss the types of cholesterol, what cholesterol does to the body, and how cholesterol levels can be controlled.   Nutrition II: Labels: -Discuss the different components of food labels and how to read food labels.   Respiratory Infections: - Discuss the signs and symptoms of respiratory infections, ways to prevent respiratory infections, and the importance of seeking medical treatment when having a respiratory infection.   Stress I: Signs and Symptoms: - Discuss the causes of stress, how stress may lead to anxiety and depression, and ways to limit stress.   Stress II: Relaxation: -Discuss relaxation techniques to limit stress.   Oxygen for Home/Travel: - Discuss how to prepare for travel when on oxygen and proper ways to transport and store oxygen to ensure safety.   Knowledge Questionnaire Score:  Knowledge Questionnaire Score - 01/09/20 1247      Knowledge Questionnaire Score   Pre Score 10/18  Core Components/Risk Factors/Patient Goals at Admission:  Personal Goals and Risk Factors at Admission - 01/09/20 1303      Core Components/Risk Factors/Patient Goals on Admission    Weight Management Yes;Weight Maintenance    Tobacco Cessation Yes    Number of packs per day takes him about a month to go through a pack currently    Intervention Assist the participant in steps to quit. Provide individualized education and counseling about committing to Tobacco Cessation, relapse prevention, and pharmacological support that can be provided by physician.;Advice worker, assist with locating and accessing local/national Quit Smoking programs, and support quit date choice.   He is in a cessation program through the [place]   Expected Outcomes Short Term: Will demonstrate readiness to quit, by selecting a quit date.;Short Term: Will quit all tobacco product use, adhering to prevention of relapse plan.;Long Term: Complete abstinence  from all tobacco products for at least 12 months from quit date.    Improve shortness of breath with ADL's Yes    Intervention Provide education, individualized exercise plan and daily activity instruction to help decrease symptoms of SOB with activities of daily living.    Expected Outcomes Short Term: Improve cardiorespiratory fitness to achieve a reduction of symptoms when performing ADLs;Long Term: Be able to perform more ADLs without symptoms or delay the onset of symptoms           Core Components/Risk Factors/Patient Goals Review:    Core Components/Risk Factors/Patient Goals at Discharge (Final Review):    ITP Comments:   Comments: Patient arrived for 1st visit/orientation/education at 1230. Patient was referred to PR by Bobbye Charleston, NP, at the [facility] due to dyspnea. During orientation advised patient on arrival and appointment times what to wear, what to do before, during and after exercise. Reviewed attendance and class policy.  Pt is scheduled to return Pulmonary Rehab on 01/13/2020 at 1045. Pt was advised to come to class 15 minutes before class starts.  Discussed RPE/Dpysnea scales. Patient participated in warm up stretches. Patient was able to complete 6 minute walk test. Patient was measured for the equipment. Discussed equipment safety with patient. Took patient pre-anthropometric measurements. Patient finished visit at 1400.

## 2020-01-13 ENCOUNTER — Encounter (HOSPITAL_COMMUNITY)
Admission: RE | Admit: 2020-01-13 | Discharge: 2020-01-13 | Disposition: A | Discharge status: Home / Self Care | Payer: No Typology Code available for payment source | Source: Ambulatory Visit | Attending: Registered Nurse | Admitting: Registered Nurse

## 2020-01-13 ENCOUNTER — Other Ambulatory Visit: Payer: Self-pay

## 2020-01-13 VITALS — Wt 169.1 lb

## 2020-01-13 DIAGNOSIS — R0600 Dyspnea, unspecified: Secondary | ICD-10-CM | POA: Diagnosis not present

## 2020-01-13 NOTE — Progress Notes (Signed)
Daily Session Note  Patient Details  Name: Trevor Key MRN: [id number] Date of Birth: 1955-05-08 Referring Provider:   Flowsheet Row PULMONARY REHAB OTHER RESP ORIENTATION from 01/09/2020 in [place]  Referring Provider Bobbye Charleston, NP      Encounter Date: 01/13/2020  Check In:  Session Check In - 01/13/20 1045      Check-In   Supervising physician immediately available to respond to emergencies [facility] MD immediately available    Physician(s) Dr. Domenic Polite    Location [facility]    Staff Present Cathren Harsh, MS, Exercise Physiologist;Phyllis Billingsley, RN    Virtual Visit No    Medication changes reported     No    Fall or balance concerns reported    No    Tobacco Cessation No Change    Warm-up and Cool-down Performed as group-led instruction    Resistance Training Performed Yes    VAD Patient? No    PAD/SET Patient? No      Pain Assessment   Currently in Pain? No/denies    Multiple Pain Sites No           Capillary Blood Glucose: No results found for this or any previous visit (from the past 24 hour(s)).    Social History   Tobacco Use  Smoking Status Current Every Day Smoker  . Packs/day: 0.25  . Years: 40.00  . Pack years: 10.00  . Types: Cigarettes  Smokeless Tobacco Never Used  Tobacco Comment   He is currently enrolled in a smoking cessation program through the [place]. He reports that it is working.    Goals Met:  Independence with exercise equipment Exercise tolerated well No report of cardiac concerns or symptoms Strength training completed today  Goals Unmet:  Not Applicable  Comments: check out 1145   Dr. Kathie Dike is Medical Director for [facility] Pulmonary Rehab.

## 2020-01-14 ENCOUNTER — Encounter (HOSPITAL_COMMUNITY): Payer: No Typology Code available for payment source

## 2020-01-15 ENCOUNTER — Other Ambulatory Visit: Payer: Self-pay

## 2020-01-15 ENCOUNTER — Encounter (HOSPITAL_COMMUNITY)
Admission: RE | Admit: 2020-01-15 | Discharge: 2020-01-15 | Disposition: A | Discharge status: Home / Self Care | Payer: No Typology Code available for payment source | Source: Ambulatory Visit | Attending: Registered Nurse | Admitting: Registered Nurse

## 2020-01-15 DIAGNOSIS — R0600 Dyspnea, unspecified: Secondary | ICD-10-CM

## 2020-01-15 NOTE — Progress Notes (Signed)
Pulmonary Individual Treatment Plan  Patient Details  Name: Trevor Key MRN: [id number] Date of Birth: [birth year] Referring Provider:   Flowsheet Row PULMONARY REHAB OTHER RESP ORIENTATION from 01/09/2020 in [place]  Referring Provider Bobbye Charleston, NP      Initial Encounter Date:  [name] OTHER RESP ORIENTATION from 01/09/2020 in [place]  Date 01/09/20      Visit Diagnosis: Dyspnea, unspecified type  Patient's Home Medications on Admission:   Current Outpatient Medications:  .  albuterol (PROVENTIL HFA;VENTOLIN HFA) 108 (90 BASE) MCG/ACT inhaler, Inhale 1-2 puffs into the lungs every 6 (six) hours as needed for wheezing., Disp: 1 Inhaler, Rfl: 0 .  aspirin 81 MG chewable tablet, Chew 81 mg by mouth daily., Disp: , Rfl:  .  atorvastatin (LIPITOR) 80 MG tablet, Take 80 mg by mouth daily., Disp: , Rfl:  .  cholecalciferol (VITAMIN D3) 25 MCG (1000 UNIT) tablet, Take 1,000 Units by mouth daily., Disp: , Rfl:  .  fluticasone furoate-vilanterol (BREO ELLIPTA) 100-25 MCG/INH AEPB, Inhale 1 puff into the lungs daily., Disp: , Rfl:  .  losartan (COZAAR) 100 MG tablet, Take 100 mg by mouth daily., Disp: , Rfl:  .  metoprolol succinate (TOPROL-XL) 50 MG 24 hr tablet, Take 1 tablet (50 mg total) by mouth daily. Take with or immediately following a meal., Disp: 90 tablet, Rfl: 3 .  metoprolol tartrate (LOPRESSOR) 25 MG tablet, Take 25 mg by mouth 2 (two) times daily., Disp: , Rfl:  .  nitroGLYCERIN (NITROLINGUAL) 0.4 MG/SPRAY spray, Place 1 spray under the tongue every 5 (five) minutes as needed. For chest pain., Disp: , Rfl:  .  omeprazole (PRILOSEC) 20 MG capsule, Take 20 mg by mouth daily., Disp: , Rfl:  .  primidone (MYSOLINE) 50 MG tablet, Take 50 mg by mouth daily., Disp: , Rfl:  .  tiotropium (SPIRIVA) 18 MCG inhalation capsule, Place 18 mcg into inhaler and inhale daily., Disp: , Rfl:   Past Medical History: Past  Medical History:  Diagnosis Date  . Coronary atherosclerosis of native coronary artery    BMS LAD 1/13, LVEF 55-60%  . Hypercholesteremia   . Hypertension   . MI (myocardial infarction) ([place])    AMI 1/13    Tobacco Use: Social History   Tobacco Use  Smoking Status Current Every Day Smoker  . Packs/day: 0.25  . Years: 40.00  . Pack years: 10.00  . Types: Cigarettes  Smokeless Tobacco Never Used  Tobacco Comment   He is currently enrolled in a smoking cessation program through the [place]. He reports that it is working.    Labs: Recent Review Flowsheet Data    Labs for ITP Cardiac and Pulmonary Rehab Latest Ref Rng & Units 03/27/2011 10/28/2011 05/19/2016 05/20/2016   Cholestrol 0 - 200 mg/dL 118 - - 123   LDLCALC 0 - 99 mg/dL 66 - - 71   HDL >40 mg/dL 33(L) - - 33(L)   Trlycerides <150 mg/dL 96 - - 93   Hemoglobin A1c 4.8 - 5.6 % - - - 5.9(H)   TCO2 0 - 100 mmol/L - 26 28 -      Capillary Blood Glucose: No results found for: GLUCAP   Pulmonary Assessment Scores:  Pulmonary Assessment Scores    Row Name 01/09/20 1248         ADL UCSD   SOB Score total 48           CAT Score  CAT Score 22           mMRC Score   mMRC Score 3           UCSD: Self-administered rating of dyspnea associated with activities of daily living (ADLs) 6-point scale (0 = "not at all" to 5 = "maximal or unable to do because of breathlessness")  Scoring Scores range from 0 to 120.  Minimally important difference is 5 units  CAT: CAT can identify the health impairment of COPD patients and is better correlated with disease progression.  CAT has a scoring range of zero to 40. The CAT score is classified into four groups of low (less than 10), medium (10 - 20), high (21-30) and very high (31-40) based on the impact level of disease on health status. A CAT score over 10 suggests significant symptoms.  A worsening CAT score could be explained by an exacerbation, poor medication adherence, poor  inhaler technique, or progression of COPD or comorbid conditions.  CAT MCID is 2 points  mMRC: mMRC (Modified Medical Research Council) Dyspnea Scale is used to assess the degree of baseline functional disability in patients of respiratory disease due to dyspnea. No minimal important difference is established. A decrease in score of 1 point or greater is considered a positive change.   Pulmonary Function Assessment:  Pulmonary Function Assessment - 01/09/20 1306      Breath   Shortness of Breath Yes;Limiting activity           Exercise Target Goals: Exercise Program Goal: Individual exercise prescription set using results from initial 6 min walk test and THRR while considering  patient's activity barriers and safety.   Exercise Prescription Goal: Initial exercise prescription builds to 30-45 minutes a day of aerobic activity, 2-3 days per week.  Home exercise guidelines will be given to patient during program as part of exercise prescription that the participant will acknowledge.  Activity Barriers & Risk Stratification:  Activity Barriers & Cardiac Risk Stratification - 01/09/20 1305      Activity Barriers & Cardiac Risk Stratification   Activity Barriers Deconditioning;Shortness of Breath    Cardiac Risk Stratification High           6 Minute Walk:  6 Minute Walk    Row Name 01/09/20 1419         6 Minute Walk   Phase Initial     Distance 1400 feet     Walk Time 6 minutes     # of Rest Breaks 0     MPH 2.7     METS 3.39     RPE 9     Perceived Dyspnea  15     VO2 Peak 11.88     Symptoms Yes (comment)     Comments Complained of Shortness of Breath     Resting HR 59 bpm     Resting BP 144/80     Resting Oxygen Saturation  98 %     Exercise Oxygen Saturation  during 6 min walk 95 %     Max Ex. HR 102 bpm     Max Ex. BP 162/82     2 Minute Post BP 158/78           Interval HR   1 Minute HR 59     2 Minute HR 92     3 Minute HR 94     4 Minute HR 93      5 Minute HR 96  6 Minute HR 102     2 Minute Post HR 98     Interval Heart Rate? Yes           Interval Oxygen   Interval Oxygen? Yes     Baseline Oxygen Saturation % 98 %     1 Minute Oxygen Saturation % 95 %     1 Minute Liters of Oxygen 0 L     2 Minute Oxygen Saturation % 97 %     2 Minute Liters of Oxygen 0 L     3 Minute Oxygen Saturation % 97 %     3 Minute Liters of Oxygen 0 L     4 Minute Oxygen Saturation % 97 %     4 Minute Liters of Oxygen 0 L     5 Minute Oxygen Saturation % 97 %     5 Minute Liters of Oxygen 0 L     6 Minute Oxygen Saturation % 96 %     6 Minute Liters of Oxygen 0 L     2 Minute Post Oxygen Saturation % 98 %     2 Minute Post Liters of Oxygen 0 L            Oxygen Initial Assessment:  Oxygen Initial Assessment - 01/09/20 1305      Home Oxygen   Home Oxygen Device None    Sleep Oxygen Prescription CPAP    Liters per minute 0    Home Exercise Oxygen Prescription None    Home Resting Oxygen Prescription None    Compliance with Home Oxygen Use Yes           Oxygen Re-Evaluation:  Oxygen Re-Evaluation    Row Name 01/13/20 1157             Program Oxygen Prescription   Program Oxygen Prescription None               Home Oxygen   Home Oxygen Device None       Sleep Oxygen Prescription CPAP       Liters per minute 0       Home Exercise Oxygen Prescription None       Home Resting Oxygen Prescription None       Compliance with Home Oxygen Use Yes               Goals/Expected Outcomes   Short Term Goals To learn and exhibit compliance with exercise, home and travel O2 prescription;To learn and understand importance of monitoring SPO2 with pulse oximeter and demonstrate accurate use of the pulse oximeter.;To learn and understand importance of maintaining oxygen saturations>88%;To learn and demonstrate proper pursed lip breathing techniques or other breathing techniques.;To learn and demonstrate proper use of respiratory  medications       Long  Term Goals Exhibits compliance with exercise, home and travel O2 prescription;Verbalizes importance of monitoring SPO2 with pulse oximeter and return demonstration;Maintenance of O2 saturations>88%;Exhibits proper breathing techniques, such as pursed lip breathing or other method taught during program session;Compliance with respiratory medication;Demonstrates proper use of MDI's       Comments Patient is doing well with oxygen saturation with no complaints.       Goals/Expected Outcomes Compliance              Oxygen Discharge (Final Oxygen Re-Evaluation):  Oxygen Re-Evaluation - 01/13/20 1157      Program Oxygen Prescription   Program Oxygen Prescription None      Home  Oxygen   Home Oxygen Device None    Sleep Oxygen Prescription CPAP    Liters per minute 0    Home Exercise Oxygen Prescription None    Home Resting Oxygen Prescription None    Compliance with Home Oxygen Use Yes      Goals/Expected Outcomes   Short Term Goals To learn and exhibit compliance with exercise, home and travel O2 prescription;To learn and understand importance of monitoring SPO2 with pulse oximeter and demonstrate accurate use of the pulse oximeter.;To learn and understand importance of maintaining oxygen saturations>88%;To learn and demonstrate proper pursed lip breathing techniques or other breathing techniques.;To learn and demonstrate proper use of respiratory medications    Long  Term Goals Exhibits compliance with exercise, home and travel O2 prescription;Verbalizes importance of monitoring SPO2 with pulse oximeter and return demonstration;Maintenance of O2 saturations>88%;Exhibits proper breathing techniques, such as pursed lip breathing or other method taught during program session;Compliance with respiratory medication;Demonstrates proper use of MDI's    Comments Patient is doing well with oxygen saturation with no complaints.    Goals/Expected Outcomes Compliance            Initial Exercise Prescription:  Initial Exercise Prescription - 01/09/20 1400      Date of Initial Exercise RX and Referring Provider   Date 01/09/20    Referring Provider Bobbye Charleston, NP    Expected Discharge Date 05/13/20      Treadmill   MPH 1.6    Grade 0    Minutes 17      Recumbant Elliptical   Level 1    RPM 60    Minutes 22      Prescription Details   Frequency (times per week) 2    Duration Progress to 30 minutes of continuous aerobic without signs/symptoms of physical distress      Intensity   THRR 40-80% of Max Heartrate 62-125    Ratings of Perceived Exertion 11-13    Perceived Dyspnea 0-4      Progression   Progression Continue progressive overload as per policy without signs/symptoms or physical distress.      Resistance Training   Training Prescription Yes    Weight 3    Reps 10-15           Perform Capillary Blood Glucose checks as needed.  Exercise Prescription Changes:  Exercise Prescription Changes    Row Name 01/13/20 1100             Response to Exercise   Blood Pressure (Admit) 172/88       Blood Pressure (Exercise) 180/90       Blood Pressure (Exit) 168/72       Heart Rate (Admit) 64 bpm       Heart Rate (Exercise) 80 bpm       Heart Rate (Exit) 68 bpm       Oxygen Saturation (Admit) 97 %       Oxygen Saturation (Exercise) 96 %       Oxygen Saturation (Exit) 97 %       Rating of Perceived Exertion (Exercise) 11       Perceived Dyspnea (Exercise) 13       Duration Progress to 30 minutes of  aerobic without signs/symptoms of physical distress       Intensity THRR unchanged               Progression   Progression Continue to progress workloads to maintain intensity without signs/symptoms of physical distress.  Resistance Training   Training Prescription Yes       Weight 3       Reps 10-15               Treadmill   MPH 1.6       Grade 0       Minutes 17       METs 2.2               Recumbant  Elliptical   Level 1       RPM 42       Minutes 22       METs 1.5              Exercise Comments:  Exercise Comments    Row Name 01/13/20 1156           Exercise Comments Patient completed first exercise session today. He has been through the program before, so the transition was very easy. He tolerated exercise well.              Exercise Goals and Review:  Exercise Goals    Row Name 01/09/20 1423             Exercise Goals   Increase Physical Activity Yes       Intervention Provide advice, education, support and counseling about physical activity/exercise needs.;Develop an individualized exercise prescription for aerobic and resistive training based on initial evaluation findings, risk stratification, comorbidities and participant's personal goals.       Expected Outcomes Short Term: Attend rehab on a regular basis to increase amount of physical activity.;Long Term: Add in home exercise to make exercise part of routine and to increase amount of physical activity.;Long Term: Exercising regularly at least 3-5 days a week.       Increase Strength and Stamina Yes       Intervention Provide advice, education, support and counseling about physical activity/exercise needs.;Develop an individualized exercise prescription for aerobic and resistive training based on initial evaluation findings, risk stratification, comorbidities and participant's personal goals.       Expected Outcomes Short Term: Increase workloads from initial exercise prescription for resistance, speed, and METs.;Short Term: Perform resistance training exercises routinely during rehab and add in resistance training at home;Long Term: Improve cardiorespiratory fitness, muscular endurance and strength as measured by increased METs and functional capacity (6MWT)       Able to understand and use rate of perceived exertion (RPE) scale Yes       Intervention Provide education and explanation on how to use RPE scale        Expected Outcomes Short Term: Able to use RPE daily in rehab to express subjective intensity level;Long Term:  Able to use RPE to guide intensity level when exercising independently       Able to understand and use Dyspnea scale Yes       Intervention Provide education and explanation on how to use Dyspnea scale       Expected Outcomes Short Term: Able to use Dyspnea scale daily in rehab to express subjective sense of shortness of breath during exertion;Long Term: Able to use Dyspnea scale to guide intensity level when exercising independently       Knowledge and understanding of Target Heart Rate Range (THRR) Yes       Intervention Provide education and explanation of THRR including how the numbers were predicted and where they are located for reference       Expected Outcomes  Short Term: Able to state/look up THRR;Short Term: Able to use daily as guideline for intensity in rehab;Long Term: Able to use THRR to govern intensity when exercising independently       Able to check pulse independently Yes       Intervention Provide education and demonstration on how to check pulse in carotid and radial arteries.;Review the importance of being able to check your own pulse for safety during independent exercise       Expected Outcomes Short Term: Able to explain why pulse checking is important during independent exercise;Long Term: Able to check pulse independently and accurately       Understanding of Exercise Prescription Yes       Intervention Provide education, explanation, and written materials on patient's individual exercise prescription       Expected Outcomes Short Term: Able to explain program exercise prescription;Long Term: Able to explain home exercise prescription to exercise independently              Exercise Goals Re-Evaluation :  Exercise Goals Re-Evaluation    [place] Name 01/13/20 1152             Exercise Goal Re-Evaluation   Exercise Goals Review Increase Physical  Activity;Increase Strength and Stamina;Able to understand and use rate of perceived exertion (RPE) scale;Able to understand and use Dyspnea scale;Knowledge and understanding of Target Heart Rate Range (THRR);Able to check pulse independently;Understanding of Exercise Prescription       Comments Patient has completed 1 exercise session. He has been through the program before so it was a very easy transition for him. He tolerated exercise well. He is very positive and engaging when he comes to rehab. He is exercising at 1.5 METs on the elliptical. Will continue to progress exercise as able.       Expected Outcomes Through exercise at rehab and with a home exercise program, patient will reach their goals.              Discharge Exercise Prescription (Final Exercise Prescription Changes):  Exercise Prescription Changes - 01/13/20 1100      Response to Exercise   Blood Pressure (Admit) 172/88    Blood Pressure (Exercise) 180/90    Blood Pressure (Exit) 168/72    Heart Rate (Admit) 64 bpm    Heart Rate (Exercise) 80 bpm    Heart Rate (Exit) 68 bpm    Oxygen Saturation (Admit) 97 %    Oxygen Saturation (Exercise) 96 %    Oxygen Saturation (Exit) 97 %    Rating of Perceived Exertion (Exercise) 11    Perceived Dyspnea (Exercise) 13    Duration Progress to 30 minutes of  aerobic without signs/symptoms of physical distress    Intensity THRR unchanged      Progression   Progression Continue to progress workloads to maintain intensity without signs/symptoms of physical distress.      Resistance Training   Training Prescription Yes    Weight 3    Reps 10-15      Treadmill   MPH 1.6    Grade 0    Minutes 17    METs 2.2      Recumbant Elliptical   Level 1    RPM 42    Minutes 22    METs 1.5           Nutrition:  Target Goals: Understanding of nutrition guidelines, daily intake of sodium 1500mg , cholesterol 200mg , calories 30% from fat and 7% or less from saturated  fats, daily to  have 5 or more servings of fruits and vegetables.  Biometrics:  Pre Biometrics - 01/13/20 1156      Pre Biometrics   Weight 76.7 kg    BMI (Calculated) 30.92            Nutrition Therapy Plan and Nutrition Goals:  Nutrition Therapy & Goals - 01/14/20 0955      Personal Nutrition Goals   Comments Patient socred 63 on his diet assessment. We will continue to provide nutritional education through handouts.      Intervention Plan   Intervention Nutrition handout(s) given to patient.           Nutrition Assessments:  Nutrition Assessments - 01/09/20 1311      MEDFICTS Scores   Pre Score 63          MEDIFICTS Score Key:  ?70 Need to make dietary changes   40-70 Heart Healthy Diet  ? 40 Therapeutic Level Cholesterol Diet   Picture Your Plate Scores:  <28 Unhealthy dietary pattern with much room for improvement.  41-50 Dietary pattern unlikely to meet recommendations for good health and room for improvement.  51-60 More healthful dietary pattern, with some room for improvement.   >60 Healthy dietary pattern, although there may be some specific behaviors that could be improved.    Nutrition Goals Re-Evaluation:   Nutrition Goals Discharge (Final Nutrition Goals Re-Evaluation):   Psychosocial: Target Goals: Acknowledge presence or absence of significant depression and/or stress, maximize coping skills, provide positive support system. Participant is able to verbalize types and ability to use techniques and skills needed for reducing stress and depression.  Initial Review & Psychosocial Screening:  Initial Psych Review & Screening - 01/09/20 1259      Initial Review   Current issues with None Identified      Family Dynamics   Good Support System? Yes    Comments He has three sisters who check up on him frequently. His wife calls him multiple times per day to check up on him. His brother stops by to see him everyday after work. He sees his 3 children  everyday. He has a very positive support system.      Barriers   Psychosocial barriers to participate in program There are no identifiable barriers or psychosocial needs.      Screening Interventions   Interventions Encouraged to exercise    Expected Outcomes Long Term goal: The participant improves quality of Life and PHQ9 Scores as seen by post scores and/or verbalization of changes           Quality of Life Scores:  Quality of Life - 01/09/20 1432      Quality of Life   Select Quality of Life      Quality of Life Scores   Health/Function Pre 27.54 %    Socioeconomic Pre 27.75 %    Psych/Spiritual Pre 30 %    Family Pre 30 %    GLOBAL Pre 28.52 %          Scores of 19 and below usually indicate a poorer quality of life in these areas.  A difference of  2-3 points is a clinically meaningful difference.  A difference of 2-3 points in the total score of the Quality of Life Index has been associated with significant improvement in overall quality of life, self-image, physical symptoms, and general health in studies assessing change in quality of life.   PHQ-9: Recent Review Flowsheet Data  Depression screen [facility] 2/9 01/09/2020 02/07/2011   Decreased Interest 0 0   Down, Depressed, Hopeless 0 0   PHQ - 2 Score 0 0   Altered sleeping 1 -   Tired, decreased energy 1 -   Change in appetite 0 -   Feeling bad or failure about yourself  0 -   Trouble concentrating 0 -   Moving slowly or fidgety/restless 0 -   Suicidal thoughts 0 -   PHQ-9 Score 2 -   Difficult doing work/chores Not difficult at all -     Interpretation of Total Score  Total Score Depression Severity:  1-4 = Minimal depression, 5-9 = Mild depression, 10-14 = Moderate depression, 15-19 = Moderately severe depression, 20-27 = Severe depression   Psychosocial Evaluation and Intervention:  Psychosocial Evaluation - 01/09/20 1418      Psychosocial Evaluation & Interventions   Interventions Encouraged to  exercise with the program and follow exercise prescription    Comments At orientation, patient has no identifiable psychosocial issues. He reports that he has a great support system between his family and the [employer]. His PHQ-9 was a 2 and his QOL was a 28.52.    Expected Outcomes Patient will continue to not have any psychological issues for the duration of the program.    Continue Psychosocial Services  No Follow up required           Psychosocial Re-Evaluation:  Psychosocial Re-Evaluation    [place] Name 01/14/20 0944             Psychosocial Re-Evaluation   Current issues with None Identified       Comments Patient is new to the program completing 2 sessions. He continues to have no psychosocial issues identified. Will continue to monitor.       Expected Outcomes Patient will have no psychosocial issues identified at [place].       Interventions Relaxation education;Stress management education;Encouraged to attend Pulmonary Rehabilitation for the exercise       Continue Psychosocial Services  No Follow up required              Psychosocial Discharge (Final Psychosocial Re-Evaluation):  Psychosocial Re-Evaluation - 01/14/20 0944      Psychosocial Re-Evaluation   Current issues with None Identified    Comments Patient is new to the program completing 2 sessions. He continues to have no psychosocial issues identified. Will continue to monitor.    Expected Outcomes Patient will have no psychosocial issues identified at [place].    Interventions Relaxation education;Stress management education;Encouraged to attend Pulmonary Rehabilitation for the exercise    Continue Psychosocial Services  No Follow up required            Education: Education Goals: Education classes will be provided on a weekly basis, covering required topics. Participant will state understanding/return demonstration of topics presented.  Learning Barriers/Preferences:  Learning  Barriers/Preferences - 01/09/20 1303      Learning Barriers/Preferences   Learning Barriers None    Learning Preferences Skilled Demonstration           Education Topics: How Lungs Work and Diseases: - Discuss the anatomy of the lungs and diseases that can affect the lungs, such as COPD.   Exercise: -Discuss the importance of exercise, FITT principles of exercise, normal and abnormal responses to exercise, and how to exercise safely.   Environmental Irritants: -Discuss types of environmental irritants and how to limit exposure to environmental irritants.   Meds/Inhalers and oxygen: -  Discuss respiratory medications, definition of an inhaler and oxygen, and the proper way to use an inhaler and oxygen.   Energy Saving Techniques: - Discuss methods to conserve energy and decrease shortness of breath when performing activities of daily living.    Bronchial Hygiene / Breathing Techniques: - Discuss breathing mechanics, pursed-lip breathing technique,  proper posture, effective ways to clear airways, and other functional breathing techniques   Cleaning Equipment: - Provides group verbal and written instruction about the health risks of elevated stress, cause of high stress, and healthy ways to reduce stress.   Nutrition I: Fats: - Discuss the types of cholesterol, what cholesterol does to the body, and how cholesterol levels can be controlled.   Nutrition II: Labels: -Discuss the different components of food labels and how to read food labels.   Respiratory Infections: - Discuss the signs and symptoms of respiratory infections, ways to prevent respiratory infections, and the importance of seeking medical treatment when having a respiratory infection.   Stress I: Signs and Symptoms: - Discuss the causes of stress, how stress may lead to anxiety and depression, and ways to limit stress.   Stress II: Relaxation: -Discuss relaxation techniques to limit  stress.   Oxygen for Home/Travel: - Discuss how to prepare for travel when on oxygen and proper ways to transport and store oxygen to ensure safety.   Knowledge Questionnaire Score:  Knowledge Questionnaire Score - 01/09/20 1247      Knowledge Questionnaire Score   Pre Score 10/18           Core Components/Risk Factors/Patient Goals at Admission:  Personal Goals and Risk Factors at Admission - 01/09/20 1303      Core Components/Risk Factors/Patient Goals on Admission    Weight Management Yes;Weight Maintenance    Tobacco Cessation Yes    Number of packs per day takes him about a month to go through a pack currently    Intervention Assist the participant in steps to quit. Provide individualized education and counseling about committing to Tobacco Cessation, relapse prevention, and pharmacological support that can be provided by physician.;Advice worker, assist with locating and accessing local/national Quit Smoking programs, and support quit date choice.   He is in a cessation program through the [place]   Expected Outcomes Short Term: Will demonstrate readiness to quit, by selecting a quit date.;Short Term: Will quit all tobacco product use, adhering to prevention of relapse plan.;Long Term: Complete abstinence from all tobacco products for at least 12 months from quit date.    Improve shortness of breath with ADL's Yes    Intervention Provide education, individualized exercise plan and daily activity instruction to help decrease symptoms of SOB with activities of daily living.    Expected Outcomes Short Term: Improve cardiorespiratory fitness to achieve a reduction of symptoms when performing ADLs;Long Term: Be able to perform more ADLs without symptoms or delay the onset of symptoms           Core Components/Risk Factors/Patient Goals Review:   Goals and Risk Factor Review    Row Name 01/14/20 0945             Core Components/Risk Factors/Patient Goals Review    Personal Goals Review Other;Tobacco Cessation       Review Patient is new to the program completing 2 sessions. He was referred to the pulmonary rehab program due to dyspnea on exertion. His personal goals for the program are to get stronger and have more energy; have less SOB. We  will continue to monitor as he works toward meeting his goals.       Expected Outcomes Patient will complete the program meeting both program and personal goals.              Core Components/Risk Factors/Patient Goals at Discharge (Final Review):   Goals and Risk Factor Review - 01/14/20 0945      Core Components/Risk Factors/Patient Goals Review   Personal Goals Review Other;Tobacco Cessation    Review Patient is new to the program completing 2 sessions. He was referred to the pulmonary rehab program due to dyspnea on exertion. His personal goals for the program are to get stronger and have more energy; have less SOB. We will continue to monitor as he works toward meeting his goals.    Expected Outcomes Patient will complete the program meeting both program and personal goals.           ITP Comments:   Comments: ITP REVIEW Pt is making expected progress toward pulmonary rehab goals after completing 2 sessions. Recommend continued exercise, life style modification, education, and utilization of breathing techniques to increase stamina and strength and decrease shortness of breath with exertion.

## 2020-01-15 NOTE — Progress Notes (Signed)
Daily Session Note  Patient Details  Name: Trevor Key MRN: [id number] Date of Birth: 11/02/1955 Referring Provider:   Flowsheet Row PULMONARY REHAB OTHER RESP ORIENTATION from 01/09/2020 in [place]  Referring Provider Bobbye Charleston, NP      Encounter Date: 01/15/2020  Check In:  Session Check In - 01/15/20 1040      Check-In   Supervising physician immediately available to respond to emergencies [facility] MD immediately available    Physician(s) Dr. Harl Bowie    Location [facility]    Staff Present Cathren Harsh, MS, Exercise Physiologist;Lamija Besse Wynetta Emery, RN, BSN    Virtual Visit No    Medication changes reported     No    Fall or balance concerns reported    No    Tobacco Cessation No Change    Warm-up and Cool-down Performed as group-led instruction    Resistance Training Performed Yes    VAD Patient? No    PAD/SET Patient? No      Pain Assessment   Currently in Pain? No/denies    Multiple Pain Sites No           Capillary Blood Glucose: No results found for this or any previous visit (from the past 24 hour(s)).    Social History   Tobacco Use  Smoking Status Current Every Day Smoker  . Packs/day: 0.25  . Years: 40.00  . Pack years: 10.00  . Types: Cigarettes  Smokeless Tobacco Never Used  Tobacco Comment   He is currently enrolled in a smoking cessation program through the [place]. He reports that it is working.    Goals Met:  Proper associated with RPD/PD & O2 Sat Independence with exercise equipment Improved SOB with ADL's Using PLB without cueing & demonstrates good technique Exercise tolerated well No report of cardiac concerns or symptoms Strength training completed today  Goals Unmet:  Not Applicable  Comments: Check out 1145.   Dr. Kathie Dike is Medical Director for [facility].

## 2020-01-20 ENCOUNTER — Encounter (HOSPITAL_COMMUNITY): Payer: No Typology Code available for payment source

## 2020-01-22 ENCOUNTER — Encounter (HOSPITAL_COMMUNITY): Payer: No Typology Code available for payment source

## 2020-01-27 ENCOUNTER — Other Ambulatory Visit: Payer: Self-pay

## 2020-01-27 ENCOUNTER — Encounter (HOSPITAL_COMMUNITY)
Admission: RE | Admit: 2020-01-27 | Discharge: 2020-01-27 | Disposition: A | Discharge status: Home / Self Care | Payer: No Typology Code available for payment source | Source: Ambulatory Visit | Attending: Registered Nurse | Admitting: Registered Nurse

## 2020-01-27 VITALS — Wt 170.4 lb

## 2020-01-27 DIAGNOSIS — R0600 Dyspnea, unspecified: Secondary | ICD-10-CM

## 2020-01-27 NOTE — Progress Notes (Signed)
Daily Session Note  Patient Details  Name: Trevor Key MRN: [id number] Date of Birth: 1955/12/29 Referring Provider:   Flowsheet Row PULMONARY REHAB OTHER RESP ORIENTATION from 01/09/2020 in [place]  Referring Provider Bobbye Charleston, NP      Encounter Date: 01/27/2020  Check In:  Session Check In - 01/27/20 1045      Check-In   Supervising physician immediately available to respond to emergencies [facility] MD immediately available    Physician(s) Dr. Harl Bowie    [facility]    Staff Present Cathren Harsh, MS, Exercise Physiologist;Phyllis Billingsley, RN    Virtual Visit No    Medication changes reported     No    Fall or balance concerns reported    No    Tobacco Cessation No Change    Warm-up and Cool-down Performed as group-led instruction    Resistance Training Performed Yes    VAD Patient? No      Pain Assessment   Currently in Pain? No/denies    Multiple Pain Sites No           Capillary Blood Glucose: No results found for this or any previous visit (from the past 24 hour(s)).    Social History   Tobacco Use  Smoking Status Current Every Day Smoker  . Packs/day: 0.25  . Years: 40.00  . Pack years: 10.00  . Types: Cigarettes  Smokeless Tobacco Never Used  Tobacco Comment   He is currently enrolled in a smoking cessation program through the [place]. He reports that it is working.    Goals Met:  Independence with exercise equipment Personal goals reviewed No report of cardiac concerns or symptoms Strength training completed today  Goals Unmet:  Not Applicable  Comments: check out 1145   Dr. Kathie Dike is Medical Director for [facility] Re[facility].

## 2020-01-29 ENCOUNTER — Encounter (HOSPITAL_COMMUNITY)
Admission: RE | Admit: 2020-01-29 | Discharge: 2020-01-29 | Disposition: A | Discharge status: Home / Self Care | Payer: No Typology Code available for payment source | Source: Ambulatory Visit | Attending: Registered Nurse | Admitting: Registered Nurse

## 2020-01-29 ENCOUNTER — Other Ambulatory Visit: Payer: Self-pay

## 2020-01-29 DIAGNOSIS — R0600 Dyspnea, unspecified: Secondary | ICD-10-CM

## 2020-01-29 NOTE — Progress Notes (Signed)
Daily Session Note  Patient Details  Name: Trevor Key MRN: [id number] Date of Birth: Nov 18, 1955 Referring Provider:   Flowsheet Row PULMONARY REHAB OTHER RESP ORIENTATION from 01/09/2020 in [place]  Referring Provider Bobbye Charleston, NP      Encounter Date: 01/29/2020  Check In:  Session Check In - 01/29/20 1045      Check-In   Supervising physician immediately available to respond to emergencies [facility] MD immediately available    Physician(s) Dr. Harl Bowie    [facility]    Staff Present Cathren Harsh, MS, Exercise Physiologist;Phyllis Billingsley, RN    Virtual Visit No    Medication changes reported     No    Fall or balance concerns reported    No    Tobacco Cessation No Change    Warm-up and Cool-down Performed as group-led instruction    Resistance Training Performed Yes    VAD Patient? No    PAD/SET Patient? No      Pain Assessment   Currently in Pain? No/denies    Multiple Pain Sites No           Capillary Blood Glucose: No results found for this or any previous visit (from the past 24 hour(s)).    Social History   Tobacco Use  Smoking Status Current Every Day Smoker  . Packs/day: 0.25  . Years: 40.00  . Pack years: 10.00  . Types: Cigarettes  Smokeless Tobacco Never Used  Tobacco Comment   He is currently enrolled in a smoking cessation program through the [place]. He reports that it is working.    Goals Met:  Independence with exercise equipment Exercise tolerated well No report of cardiac concerns or symptoms Strength training completed today  Goals Unmet:  Not Applicable  Comments: check out 1145   Dr. Kathie Dike is Medical Director for [facility].

## 2020-02-03 ENCOUNTER — Encounter (HOSPITAL_COMMUNITY)
Admission: RE | Admit: 2020-02-03 | Discharge: 2020-02-03 | Disposition: A | Discharge status: Home / Self Care | Payer: No Typology Code available for payment source | Source: Ambulatory Visit | Attending: Registered Nurse | Admitting: Registered Nurse

## 2020-02-03 ENCOUNTER — Other Ambulatory Visit: Payer: Self-pay

## 2020-02-03 DIAGNOSIS — R0600 Dyspnea, unspecified: Secondary | ICD-10-CM

## 2020-02-03 NOTE — Progress Notes (Signed)
Daily Session Note  Patient Details  Name: Trevor Key MRN: [id number] Date of Birth: May 08, 1955 Referring Provider:   Flowsheet Row PULMONARY REHAB OTHER RESP ORIENTATION from 01/09/2020 in [place]  Referring Provider Bobbye Charleston, NP      Encounter Date: 02/03/2020  Check In:  Session Check In - 02/03/20 1045      Check-In   Supervising physician immediately available to respond to emergencies [facility] MD immediately available    Physician(s) Dr. Domenic Polite    [facility]    Staff Present Cathren Harsh, MS, Exercise Physiologist;Phyllis Billingsley, Edison Simon, MS, ACSM-CEP, Exercise Physiologist    Virtual Visit No    Medication changes reported     No    Fall or balance concerns reported    No    Tobacco Cessation No Change    Warm-up and Cool-down Performed as group-led instruction    Resistance Training Performed Yes    VAD Patient? No    PAD/SET Patient? No      Pain Assessment   Currently in Pain? No/denies    Multiple Pain Sites No           Capillary Blood Glucose: No results found for this or any previous visit (from the past 24 hour(s)).    Social History   Tobacco Use  Smoking Status Current Every Day Smoker  . Packs/day: 0.25  . Years: 40.00  . Pack years: 10.00  . Types: Cigarettes  Smokeless Tobacco Never Used  Tobacco Comment   He is currently enrolled in a smoking cessation program through the [place]. He reports that it is working.    Goals Met:  Independence with exercise equipment Exercise tolerated well No report of cardiac concerns or symptoms Strength training completed today  Goals Unmet:  Not Applicable  Comments: checkout time is 1145   Dr. Kathie Dike is Medical Director for [facility] Pulmonary Rehab.

## 2020-02-05 ENCOUNTER — Encounter (HOSPITAL_COMMUNITY)
Admission: RE | Admit: 2020-02-05 | Discharge: 2020-02-05 | Disposition: A | Discharge status: Home / Self Care | Payer: No Typology Code available for payment source | Source: Ambulatory Visit | Attending: Registered Nurse | Admitting: Registered Nurse

## 2020-02-05 ENCOUNTER — Other Ambulatory Visit: Payer: Self-pay

## 2020-02-05 DIAGNOSIS — R0600 Dyspnea, unspecified: Secondary | ICD-10-CM | POA: Diagnosis not present

## 2020-02-05 NOTE — Progress Notes (Signed)
Daily Session Note  Patient Details  Name: Trevor Key MRN: [id number] Date of Birth: August 29, 1955 Referring Provider:   Flowsheet Row PULMONARY REHAB OTHER RESP ORIENTATION from 01/09/2020 in [place]  Referring Provider Bobbye Charleston, NP      Encounter Date: 02/05/2020  Check In:  Session Check In - 02/05/20 1042      Check-In   Supervising physician immediately available to respond to emergencies [facility] MD immediately available    Physician(s) Dr. Domenic Polite    [facility]    Staff Present Aundra Dubin, RN, BSN;Madison Audria Nine, MS, Exercise Physiologist    Virtual Visit No    Medication changes reported     No    Fall or balance concerns reported    No    Tobacco Cessation No Change    Warm-up and Cool-down Performed as group-led instruction    Resistance Training Performed Yes    VAD Patient? No    PAD/SET Patient? No      Pain Assessment   Currently in Pain? No/denies    Multiple Pain Sites No           Capillary Blood Glucose: No results found for this or any previous visit (from the past 24 hour(s)).    Social History   Tobacco Use  Smoking Status Current Every Day Smoker  . Packs/day: 0.25  . Years: 40.00  . Pack years: 10.00  . Types: Cigarettes  Smokeless Tobacco Never Used  Tobacco Comment   He is currently enrolled in a smoking cessation program through the [place]. He reports that it is working.    Goals Met:  Proper associated with RPD/PD & O2 Sat Independence with exercise equipment Improved SOB with ADL's Using PLB without cueing & demonstrates good technique Exercise tolerated well No report of cardiac concerns or symptoms Strength training completed today  Goals Unmet:  Not Applicable  Comments: Check out 1145.   Dr. Kathie Dike is Medical Director for [facility] Pulmonary Rehab.

## 2020-02-10 ENCOUNTER — Encounter (HOSPITAL_COMMUNITY)
Admission: RE | Admit: 2020-02-10 | Discharge: 2020-02-10 | Disposition: A | Discharge status: Home / Self Care | Payer: No Typology Code available for payment source | Source: Ambulatory Visit | Attending: Registered Nurse | Admitting: Registered Nurse

## 2020-02-10 ENCOUNTER — Other Ambulatory Visit: Payer: Self-pay

## 2020-02-10 VITALS — Wt 171.7 lb

## 2020-02-10 DIAGNOSIS — R0600 Dyspnea, unspecified: Secondary | ICD-10-CM | POA: Diagnosis not present

## 2020-02-10 NOTE — Progress Notes (Signed)
Daily Session Note  Patient Details  Name: Trevor Key MRN: [id number] Date of Birth: [birth year] Referring Provider:   Flowsheet Row PULMONARY REHAB OTHER RESP ORIENTATION from 01/09/2020 in [place]  Referring Provider Bobbye Charleston, NP      Encounter Date: 02/10/2020  Check In:  Session Check In - 02/10/20 1045      Check-In   Supervising physician immediately available to respond to emergencies [facility] MD immediately available    Physician(s) Dr. Harl Bowie    [facility]    Staff Present Cathren Harsh, MS, Exercise Physiologist;[place]ton Kris Mouton, MS, ACSM-CEP, Exercise Physiologist;Phyllis Billingsley, RN    Virtual Visit No    Medication changes reported     No    Fall or balance concerns reported    No    Tobacco Cessation No Change    Warm-up and Cool-down Performed as group-led instruction    Resistance Training Performed Yes    VAD Patient? No    PAD/SET Patient? No      Pain Assessment   Currently in Pain? No/denies    Multiple Pain Sites No           Capillary Blood Glucose: No results found for this or any previous visit (from the past 24 hour(s)).    Social History   Tobacco Use  Smoking Status Current Every Day Smoker  . Packs/day: 0.25  . Years: 40.00  . Pack years: 10.00  . Types: Cigarettes  Smokeless Tobacco Never Used  Tobacco Comment   He is currently enrolled in a smoking cessation program through the [place]. He reports that it is working.    Goals Met:  Independence with exercise equipment Exercise tolerated well No report of cardiac concerns or symptoms Strength training completed today  Goals Unmet:  Not Applicable  Comments: check out 1145   Dr. Kathie Dike is Medical Director for [facility] Pulmonary Rehab.

## 2020-02-12 ENCOUNTER — Other Ambulatory Visit: Payer: Self-pay

## 2020-02-12 ENCOUNTER — Encounter (HOSPITAL_COMMUNITY)
Admission: RE | Admit: 2020-02-12 | Discharge: 2020-02-12 | Disposition: A | Discharge status: Home / Self Care | Payer: No Typology Code available for payment source | Source: Ambulatory Visit | Attending: Registered Nurse | Admitting: Registered Nurse

## 2020-02-12 DIAGNOSIS — R0600 Dyspnea, unspecified: Secondary | ICD-10-CM | POA: Diagnosis not present

## 2020-02-12 NOTE — Progress Notes (Signed)
Pulmonary Individual Treatment Plan  Patient Details  Key: Trevor Key MRN: [id number] Date of Birth: [birth year] Referring Provider:   Flowsheet Row PULMONARY REHAB OTHER RESP ORIENTATION from 01/09/2020 in [place]  Referring Provider Bobbye Charleston, NP      Initial Encounter Date:  [name] OTHER RESP ORIENTATION from 01/09/2020 in [place]  Date 01/09/20      Visit Diagnosis: Dyspnea, unspecified type  Patient's Home Medications on Admission:   Current Outpatient Medications:  .  albuterol (PROVENTIL HFA;VENTOLIN HFA) 108 (90 BASE) MCG/ACT inhaler, Inhale 1-2 puffs into the lungs every 6 (six) hours as needed for wheezing., Disp: 1 Inhaler, Rfl: 0 .  aspirin 81 MG chewable tablet, Chew 81 mg by mouth daily., Disp: , Rfl:  .  atorvastatin (LIPITOR) 80 MG tablet, Take 80 mg by mouth daily., Disp: , Rfl:  .  cholecalciferol (VITAMIN D3) 25 MCG (1000 UNIT) tablet, Take 1,000 Units by mouth daily., Disp: , Rfl:  .  fluticasone furoate-vilanterol (BREO ELLIPTA) 100-25 MCG/INH AEPB, Inhale 1 puff into the lungs daily., Disp: , Rfl:  .  losartan (COZAAR) 100 MG tablet, Take 100 mg by mouth daily., Disp: , Rfl:  .  metoprolol succinate (TOPROL-XL) 50 MG 24 hr tablet, Take 1 tablet (50 mg total) by mouth daily. Take with or immediately following a meal., Disp: 90 tablet, Rfl: 3 .  metoprolol tartrate (LOPRESSOR) 25 MG tablet, Take 25 mg by mouth 2 (two) times daily., Disp: , Rfl:  .  nitroGLYCERIN (NITROLINGUAL) 0.4 MG/SPRAY spray, Place 1 spray under the tongue every 5 (five) minutes as needed. For chest pain., Disp: , Rfl:  .  omeprazole (PRILOSEC) 20 MG capsule, Take 20 mg by mouth daily., Disp: , Rfl:  .  primidone (MYSOLINE) 50 MG tablet, Take 50 mg by mouth daily., Disp: , Rfl:  .  tiotropium (SPIRIVA) 18 MCG inhalation capsule, Place 18 mcg into inhaler and inhale daily., Disp: , Rfl:   Past Medical History: Past  Medical History:  Diagnosis Date  . Coronary atherosclerosis of native coronary artery    BMS LAD 1/13, LVEF 55-60%  . Hypercholesteremia   . Hypertension   . MI (myocardial infarction) ([place])    AMI 1/13    Tobacco Use: Social History   Tobacco Use  Smoking Status Current Every Day Smoker  . Packs/day: 0.25  . Years: 40.00  . Pack years: 10.00  . Types: Cigarettes  Smokeless Tobacco Never Used  Tobacco Comment   He is currently enrolled in a smoking cessation program through the [place]. He reports that it is working.    Labs: Recent Review Flowsheet Data    Labs for ITP Cardiac and Pulmonary Rehab Latest Ref Rng & Units 03/27/2011 10/28/2011 05/19/2016 05/20/2016   Cholestrol 0 - 200 mg/dL 118 - - 123   LDLCALC 0 - 99 mg/dL 66 - - 71   HDL >40 mg/dL 33(L) - - 33(L)   Trlycerides <150 mg/dL 96 - - 93   Hemoglobin A1c 4.8 - 5.6 % - - - 5.9(H)   TCO2 0 - 100 mmol/L - 26 28 -      Capillary Blood Glucose: No results found for: GLUCAP   Pulmonary Assessment Scores:  Pulmonary Assessment Scores    Row Key 01/09/20 1248         ADL UCSD   SOB Score total 48           CAT Score  CAT Score 22           mMRC Score   mMRC Score 3           UCSD: Self-administered rating of dyspnea associated with activities of daily living (ADLs) 6-point scale (0 = "not at all" to 5 = "maximal or unable to do because of breathlessness")  Scoring Scores range from 0 to 120.  Minimally important difference is 5 units  CAT: CAT can identify the health impairment of COPD patients and is better correlated with disease progression.  CAT has a scoring range of zero to 40. The CAT score is classified into four groups of low (less than 10), medium (10 - 20), high (21-30) and very high (31-40) based on the impact level of disease on health status. A CAT score over 10 suggests significant symptoms.  A worsening CAT score could be explained by an exacerbation, poor medication adherence, poor  inhaler technique, or progression of COPD or comorbid conditions.  CAT MCID is 2 points  mMRC: mMRC (Modified Medical Research Council) Dyspnea Scale is used to assess the degree of baseline functional disability in patients of respiratory disease due to dyspnea. No minimal important difference is established. A decrease in score of 1 point or greater is considered a positive change.   Pulmonary Function Assessment:  Pulmonary Function Assessment - 01/09/20 1306      Breath   Shortness of Breath Yes;Limiting activity           Exercise Target Goals: Exercise Program Goal: Individual exercise prescription set using results from initial 6 min walk test and THRR while considering  patient's activity barriers and safety.   Exercise Prescription Goal: Initial exercise prescription builds to 30-45 minutes a day of aerobic activity, 2-3 days per week.  Home exercise guidelines will be given to patient during program as part of exercise prescription that the participant will acknowledge.  Activity Barriers & Risk Stratification:  Activity Barriers & Cardiac Risk Stratification - 01/09/20 1305      Activity Barriers & Cardiac Risk Stratification   Activity Barriers Deconditioning;Shortness of Breath    Cardiac Risk Stratification High           6 Minute Walk:  6 Minute Walk    Row Key 01/09/20 1419         6 Minute Walk   Phase Initial     Distance 1400 feet     Walk Time 6 minutes     # of Rest Breaks 0     MPH 2.7     METS 3.39     RPE 9     Perceived Dyspnea  15     VO2 Peak 11.88     Symptoms Yes (comment)     Comments Complained of Shortness of Breath     Resting HR 59 bpm     Resting BP 144/80     Resting Oxygen Saturation  98 %     Exercise Oxygen Saturation  during 6 min walk 95 %     Max Ex. HR 102 bpm     Max Ex. BP 162/82     2 Minute Post BP 158/78           Interval HR   1 Minute HR 59     2 Minute HR 92     3 Minute HR 94     4 Minute HR 93      5 Minute HR 96  6 Minute HR 102     2 Minute Post HR 98     Interval Heart Rate? Yes           Interval Oxygen   Interval Oxygen? Yes     Baseline Oxygen Saturation % 98 %     1 Minute Oxygen Saturation % 95 %     1 Minute Liters of Oxygen 0 L     2 Minute Oxygen Saturation % 97 %     2 Minute Liters of Oxygen 0 L     3 Minute Oxygen Saturation % 97 %     3 Minute Liters of Oxygen 0 L     4 Minute Oxygen Saturation % 97 %     4 Minute Liters of Oxygen 0 L     5 Minute Oxygen Saturation % 97 %     5 Minute Liters of Oxygen 0 L     6 Minute Oxygen Saturation % 96 %     6 Minute Liters of Oxygen 0 L     2 Minute Post Oxygen Saturation % 98 %     2 Minute Post Liters of Oxygen 0 L            Oxygen Initial Assessment:  Oxygen Initial Assessment - 01/09/20 1305      Home Oxygen   Home Oxygen Device None    Sleep Oxygen Prescription CPAP    Liters per minute 0    Home Exercise Oxygen Prescription None    Home Resting Oxygen Prescription None    Compliance with Home Oxygen Use Yes           Oxygen Re-Evaluation:  Oxygen Re-Evaluation    Row Key 01/13/20 1157 02/10/20 1218           Program Oxygen Prescription   Program Oxygen Prescription None None             Home Oxygen   Home Oxygen Device None None      Sleep Oxygen Prescription CPAP CPAP      Liters per minute 0 0      Home Exercise Oxygen Prescription None None      Home Resting Oxygen Prescription None None      Compliance with Home Oxygen Use Yes Yes             Goals/Expected Outcomes   Short Term Goals To learn and exhibit compliance with exercise, home and travel O2 prescription;To learn and understand importance of monitoring SPO2 with pulse oximeter and demonstrate accurate use of the pulse oximeter.;To learn and understand importance of maintaining oxygen saturations>88%;To learn and demonstrate proper pursed lip breathing techniques or other breathing techniques.;To learn and demonstrate  proper use of respiratory medications To learn and exhibit compliance with exercise, home and travel O2 prescription;To learn and understand importance of monitoring SPO2 with pulse oximeter and demonstrate accurate use of the pulse oximeter.;To learn and understand importance of maintaining oxygen saturations>88%;To learn and demonstrate proper pursed lip breathing techniques or other breathing techniques.;To learn and demonstrate proper use of respiratory medications      Long  Term Goals Exhibits compliance with exercise, home and travel O2 prescription;Verbalizes importance of monitoring SPO2 with pulse oximeter and return demonstration;Maintenance of O2 saturations>88%;Exhibits proper breathing techniques, such as pursed lip breathing or other method taught during program session;Compliance with respiratory medication;Demonstrates proper use of MDI's Exhibits compliance with exercise, home and travel O2 prescription;Verbalizes importance of monitoring SPO2 with pulse  oximeter and return demonstration;Maintenance of O2 saturations>88%;Exhibits proper breathing techniques, such as pursed lip breathing or other method taught during program session;Compliance with respiratory medication;Demonstrates proper use of MDI's      Comments Patient is doing well with oxygen saturation with no complaints. Patient is doing well with oxygen saturation with no complaints.      Goals/Expected Outcomes Compliance Compliance             Oxygen Discharge (Final Oxygen Re-Evaluation):  Oxygen Re-Evaluation - 02/10/20 1218      Program Oxygen Prescription   Program Oxygen Prescription None      Home Oxygen   Home Oxygen Device None    Sleep Oxygen Prescription CPAP    Liters per minute 0    Home Exercise Oxygen Prescription None    Home Resting Oxygen Prescription None    Compliance with Home Oxygen Use Yes      Goals/Expected Outcomes   Short Term Goals To learn and exhibit compliance with exercise, home  and travel O2 prescription;To learn and understand importance of monitoring SPO2 with pulse oximeter and demonstrate accurate use of the pulse oximeter.;To learn and understand importance of maintaining oxygen saturations>88%;To learn and demonstrate proper pursed lip breathing techniques or other breathing techniques.;To learn and demonstrate proper use of respiratory medications    Long  Term Goals Exhibits compliance with exercise, home and travel O2 prescription;Verbalizes importance of monitoring SPO2 with pulse oximeter and return demonstration;Maintenance of O2 saturations>88%;Exhibits proper breathing techniques, such as pursed lip breathing or other method taught during program session;Compliance with respiratory medication;Demonstrates proper use of MDI's    Comments Patient is doing well with oxygen saturation with no complaints.    Goals/Expected Outcomes Compliance           Initial Exercise Prescription:  Initial Exercise Prescription - 01/09/20 1400      Date of Initial Exercise RX and Referring Provider   Date 01/09/20    Referring Provider Bobbye Charleston, NP    Expected Discharge Date 05/13/20      Treadmill   MPH 1.6    Grade 0    Minutes 17      Recumbant Elliptical   Level 1    RPM 60    Minutes 22      Prescription Details   Frequency (times per week) 2    Duration Progress to 30 minutes of continuous aerobic without signs/symptoms of physical distress      Intensity   THRR 40-80% of Max Heartrate 62-125    Ratings of Perceived Exertion 11-13    Perceived Dyspnea 0-4      Progression   Progression Continue progressive overload as per policy without signs/symptoms or physical distress.      Resistance Training   Training Prescription Yes    Weight 3    Reps 10-15           Perform Capillary Blood Glucose checks as needed.  Exercise Prescription Changes:   Exercise Prescription Changes    Row Key 01/13/20 1100 01/27/20 1200 02/10/20 1200          Response to Exercise   Blood Pressure (Admit) 172/88 148/80 152/98     Blood Pressure (Exercise) 180/90 174/72 154/88     Blood Pressure (Exit) 168/72 154/86 160/88     Heart Rate (Admit) 64 bpm 70 bpm 64 bpm     Heart Rate (Exercise) 80 bpm 91 bpm 87 bpm     Heart Rate (Exit) 68 bpm  72 bpm 64 bpm     Oxygen Saturation (Admit) 97 % 99 % 98 %     Oxygen Saturation (Exercise) 96 % 98 % 98 %     Oxygen Saturation (Exit) 97 % 98 % 98 %     Rating of Perceived Exertion (Exercise) 11 13 10      Perceived Dyspnea (Exercise) 13 13 10      Duration Progress to 30 minutes of  aerobic without signs/symptoms of physical distress Continue with 30 min of aerobic exercise without signs/symptoms of physical distress. Continue with 30 min of aerobic exercise without signs/symptoms of physical distress.     Intensity THRR unchanged THRR unchanged THRR unchanged           Progression   Progression Continue to progress workloads to maintain intensity without signs/symptoms of physical distress. Continue to progress workloads to maintain intensity without signs/symptoms of physical distress. Continue to progress workloads to maintain intensity without signs/symptoms of physical distress.           Resistance Training   Training Prescription Yes Yes Yes     Weight 3 3 5  lbs     Reps 10-15 10-15 10-15     Time -- 10 Minutes 10 Minutes           Treadmill   MPH 1.6 1.6 1.6     Grade 0 0 1     Minutes 17 17 17      METs 2.2 2.2 2.4           Recumbant Elliptical   Level 1 2 2      RPM 42 43 46     Minutes 22 22 22      METs 1.5 1.8 2.5            Exercise Comments:   Exercise Comments    Row Key 01/13/20 1156           Exercise Comments Patient completed first exercise session today. He has been through the program before, so the transition was very easy. He tolerated exercise well.              Exercise Goals and Review:   Exercise Goals    Row Key 01/09/20 1423 02/10/20  1215           Exercise Goals   Increase Physical Activity Yes Yes      Intervention Provide advice, education, support and counseling about physical activity/exercise needs.;Develop an individualized exercise prescription for aerobic and resistive training based on initial evaluation findings, risk stratification, comorbidities and participant's personal goals. Provide advice, education, support and counseling about physical activity/exercise needs.;Develop an individualized exercise prescription for aerobic and resistive training based on initial evaluation findings, risk stratification, comorbidities and participant's personal goals.      Expected Outcomes Short Term: Attend rehab on a regular basis to increase amount of physical activity.;Long Term: Add in home exercise to make exercise part of routine and to increase amount of physical activity.;Long Term: Exercising regularly at least 3-5 days a week. Short Term: Attend rehab on a regular basis to increase amount of physical activity.;Long Term: Add in home exercise to make exercise part of routine and to increase amount of physical activity.;Long Term: Exercising regularly at least 3-5 days a week.      Increase Strength and Stamina Yes Yes      Intervention Provide advice, education, support and counseling about physical activity/exercise needs.;Develop an individualized exercise prescription for aerobic and resistive training based on  initial evaluation findings, risk stratification, comorbidities and participant's personal goals. Provide advice, education, support and counseling about physical activity/exercise needs.;Develop an individualized exercise prescription for aerobic and resistive training based on initial evaluation findings, risk stratification, comorbidities and participant's personal goals.      Expected Outcomes Short Term: Increase workloads from initial exercise prescription for resistance, speed, and METs.;Short Term: Perform  resistance training exercises routinely during rehab and add in resistance training at home;Long Term: Improve cardiorespiratory fitness, muscular endurance and strength as measured by increased METs and functional capacity (6MWT) Short Term: Increase workloads from initial exercise prescription for resistance, speed, and METs.;Short Term: Perform resistance training exercises routinely during rehab and add in resistance training at home;Long Term: Improve cardiorespiratory fitness, muscular endurance and strength as measured by increased METs and functional capacity (6MWT)      Able to understand and use rate of perceived exertion (RPE) scale Yes Yes      Intervention Provide education and explanation on how to use RPE scale Provide education and explanation on how to use RPE scale      Expected Outcomes Short Term: Able to use RPE daily in rehab to express subjective intensity level;Long Term:  Able to use RPE to guide intensity level when exercising independently Short Term: Able to use RPE daily in rehab to express subjective intensity level;Long Term:  Able to use RPE to guide intensity level when exercising independently      Able to understand and use Dyspnea scale Yes Yes      Intervention Provide education and explanation on how to use Dyspnea scale Provide education and explanation on how to use Dyspnea scale      Expected Outcomes Short Term: Able to use Dyspnea scale daily in rehab to express subjective sense of shortness of breath during exertion;Long Term: Able to use Dyspnea scale to guide intensity level when exercising independently Short Term: Able to use Dyspnea scale daily in rehab to express subjective sense of shortness of breath during exertion;Long Term: Able to use Dyspnea scale to guide intensity level when exercising independently      Knowledge and understanding of Target Heart Rate Range (THRR) Yes Yes      Intervention Provide education and explanation of THRR including how the  numbers were predicted and where they are located for reference Provide education and explanation of THRR including how the numbers were predicted and where they are located for reference      Expected Outcomes Short Term: Able to state/look up THRR;Short Term: Able to use daily as guideline for intensity in rehab;Long Term: Able to use THRR to govern intensity when exercising independently Short Term: Able to state/look up THRR;Short Term: Able to use daily as guideline for intensity in rehab;Long Term: Able to use THRR to govern intensity when exercising independently      Able to check pulse independently Yes Yes      Intervention Provide education and demonstration on how to check pulse in carotid and radial arteries.;Review the importance of being able to check your own pulse for safety during independent exercise Provide education and demonstration on how to check pulse in carotid and radial arteries.;Review the importance of being able to check your own pulse for safety during independent exercise      Expected Outcomes Short Term: Able to explain why pulse checking is important during independent exercise;Long Term: Able to check pulse independently and accurately Short Term: Able to explain why pulse checking is important during independent exercise;Long  Term: Able to check pulse independently and accurately      Understanding of Exercise Prescription Yes Yes      Intervention Provide education, explanation, and written materials on patient's individual exercise prescription Provide education, explanation, and written materials on patient's individual exercise prescription      Expected Outcomes Short Term: Able to explain program exercise prescription;Long Term: Able to explain home exercise prescription to exercise independently Short Term: Able to explain program exercise prescription;Long Term: Able to explain home exercise prescription to exercise independently             Exercise Goals  Re-Evaluation :  Exercise Goals Re-Evaluation    Trevor Key 01/13/20 1152 02/10/20 1215           Exercise Goal Re-Evaluation   Exercise Goals Review Increase Physical Activity;Increase Strength and Stamina;Able to understand and use rate of perceived exertion (RPE) scale;Able to understand and use Dyspnea scale;Knowledge and understanding of Target Heart Rate Range (THRR);Able to check pulse independently;Understanding of Exercise Prescription Increase Physical Activity;Increase Strength and Stamina;Able to understand and use rate of perceived exertion (RPE) scale;Able to understand and use Dyspnea scale;Knowledge and understanding of Target Heart Rate Range (THRR);Able to check pulse independently;Understanding of Exercise Prescription      Comments Patient has completed 1 exercise session. He has been through the program before so it was a very easy transition for him. He tolerated exercise well. He is very positive and engaging when he comes to rehab. He is exercising at 1.5 METs on the elliptical. Will continue to progress exercise as able. Patient has completed 7 exercise sessions. He is progressing well. He has gotten stronger and has been able to increase his intensities in resistance training and in aerobic training. He is always very positive to come to rehab and engages with the staff andother patients. He is currently exercising at 2.5 METs on the elliptical. Will continue to progress as able.      Expected Outcomes Through exercise at rehab and with a home exercise program, patient will reach their goals. Through exercise at rehab and with a home exercise program, patient will reach their goals.             Discharge Exercise Prescription (Final Exercise Prescription Changes):  Exercise Prescription Changes - 02/10/20 1200      Response to Exercise   Blood Pressure (Admit) 152/98    Blood Pressure (Exercise) 154/88    Blood Pressure (Exit) 160/88    Heart Rate (Admit) 64 bpm     Heart Rate (Exercise) 87 bpm    Heart Rate (Exit) 64 bpm    Oxygen Saturation (Admit) 98 %    Oxygen Saturation (Exercise) 98 %    Oxygen Saturation (Exit) 98 %    Rating of Perceived Exertion (Exercise) 10    Perceived Dyspnea (Exercise) 10    Duration Continue with 30 min of aerobic exercise without signs/symptoms of physical distress.    Intensity THRR unchanged      Progression   Progression Continue to progress workloads to maintain intensity without signs/symptoms of physical distress.      Resistance Training   Training Prescription Yes    Weight 5 lbs    Reps 10-15    Time 10 Minutes      Treadmill   MPH 1.6    Grade 1    Minutes 17    METs 2.4      Recumbant Elliptical   Level 2    RPM  46    Minutes 22    METs 2.5           Nutrition:  Target Goals: Understanding of nutrition guidelines, daily intake of sodium 1500mg , cholesterol 200mg , calories 30% from fat and 7% or less from saturated fats, daily to have 5 or more servings of fruits and vegetables.  Biometrics:  Pre Biometrics - 02/10/20 1217      Pre Biometrics   Weight 77.9 kg            Nutrition Therapy Plan and Nutrition Goals:  Nutrition Therapy & Goals - 02/06/20 0800      Personal Nutrition Goals   Comments We will continue to provide nutritional education through handouts.      Intervention Plan   Intervention Nutrition handout(s) given to patient.           Nutrition Assessments:  Nutrition Assessments - 01/09/20 1311      MEDFICTS Scores   Pre Score 63          MEDIFICTS Score Key:  ?70 Need to make dietary changes   40-70 Heart Healthy Diet  ? 40 Therapeutic Level Cholesterol Diet   Picture Your Plate Scores:  <62 Unhealthy dietary pattern with much room for improvement.  41-50 Dietary pattern unlikely to meet recommendations for good health and room for improvement.  51-60 More healthful dietary pattern, with some room for improvement.   >60 Healthy  dietary pattern, although there may be some specific behaviors that could be improved.    Nutrition Goals Re-Evaluation:   Nutrition Goals Discharge (Final Nutrition Goals Re-Evaluation):   Psychosocial: Target Goals: Acknowledge presence or absence of significant depression and/or stress, maximize coping skills, provide positive support system. Participant is able to verbalize types and ability to use techniques and skills needed for reducing stress and depression.  Initial Review & Psychosocial Screening:  Initial Psych Review & Screening - 01/09/20 1259      Initial Review   Current issues with None Identified      Family Dynamics   Good Support System? Yes    Comments He has three sisters who check up on him frequently. His wife calls him multiple times per day to check up on him. His brother stops by to see him everyday after work. He sees his 3 children everyday. He has a very positive support system.      Barriers   Psychosocial barriers to participate in program There are no identifiable barriers or psychosocial needs.      Screening Interventions   Interventions Encouraged to exercise    Expected Outcomes Long Term goal: The participant improves quality of Life and PHQ9 Scores as seen by post scores and/or verbalization of changes           Quality of Life Scores:  Quality of Life - 01/09/20 1432      Quality of Life   Select Quality of Life      Quality of Life Scores   Health/Function Pre 27.54 %    Socioeconomic Pre 27.75 %    Psych/Spiritual Pre 30 %    Family Pre 30 %    GLOBAL Pre 28.52 %          Scores of 19 and below usually indicate a poorer quality of life in these areas.  A difference of  2-3 points is a clinically meaningful difference.  A difference of 2-3 points in the total score of the Quality of Life Index has been  associated with significant improvement in overall quality of life, self-image, physical symptoms, and general health in studies  assessing change in quality of life.   PHQ-9: Recent Review Flowsheet Data    Depression screen [facility] 2/9 01/09/2020 02/07/2011   Decreased Interest 0 0   Down, Depressed, Hopeless 0 0   PHQ - 2 Score 0 0   Altered sleeping 1 -   Tired, decreased energy 1 -   Change in appetite 0 -   Feeling bad or failure about yourself  0 -   Trouble concentrating 0 -   Moving slowly or fidgety/restless 0 -   Suicidal thoughts 0 -   PHQ-9 Score 2 -   Difficult doing work/chores Not difficult at all -     Interpretation of Total Score  Total Score Depression Severity:  1-4 = Minimal depression, 5-9 = Mild depression, 10-14 = Moderate depression, 15-19 = Moderately severe depression, 20-27 = Severe depression   Psychosocial Evaluation and Intervention:  Psychosocial Evaluation - 01/09/20 1418      Psychosocial Evaluation & Interventions   Interventions Encouraged to exercise with the program and follow exercise prescription    Comments At orientation, patient has no identifiable psychosocial issues. He reports that he has a great support system between his family and the [employer]. His PHQ-9 was a 2 and his QOL was a 28.52.    Expected Outcomes Patient will continue to not have any psychological issues for the duration of the program.    Continue Psychosocial Services  No Follow up required           Psychosocial Re-Evaluation:  Psychosocial Re-Evaluation    Trevor Key Key 01/14/20 0944 02/06/20 0800           Psychosocial Re-Evaluation   Current issues with None Identified None Identified      Comments Patient is new to the program completing 2 sessions. He continues to have no psychosocial issues identified. Will continue to monitor. Patient is new to the program completing 6 sessions. He continues to have no psychosocial issues identified. He seems to enjoy coming to class and is very interactive with others in his class and with staff. He demonstrates a very positive outlook and  attitude and continues to work partime for [employer]. Will continue to monitor.      Expected Outcomes Patient will have no psychosocial issues identified at [place]. --      Interventions Relaxation education;Stress management education;Encouraged to attend Pulmonary Rehabilitation for the exercise Relaxation education;Stress management education;Encouraged to attend Pulmonary Rehabilitation for the exercise      Continue Psychosocial Services  No Follow up required No Follow up required             Psychosocial Discharge (Final Psychosocial Re-Evaluation):  Psychosocial Re-Evaluation - 02/06/20 0800      Psychosocial Re-Evaluation   Current issues with None Identified    Comments Patient is new to the program completing 6 sessions. He continues to have no psychosocial issues identified. He seems to enjoy coming to class and is very interactive with others in his class and with staff. He demonstrates a very positive outlook and attitude and continues to work partime for [employer]. Will continue to monitor.    Interventions Relaxation education;Stress management education;Encouraged to attend Pulmonary Rehabilitation for the exercise    Continue Psychosocial Services  No Follow up required            Education: Education Goals: Education classes  will be provided on a weekly basis, covering required topics. Participant will state understanding/return demonstration of topics presented.  Learning Barriers/Preferences:  Learning Barriers/Preferences - 01/09/20 1303      Learning Barriers/Preferences   Learning Barriers None    Learning Preferences Skilled Demonstration           Education Topics: How Lungs Work and Diseases: - Discuss the anatomy of the lungs and diseases that can affect the lungs, such as COPD.   Exercise: -Discuss the importance of exercise, FITT principles of exercise, normal and abnormal responses to exercise, and how to  exercise safely.   Environmental Irritants: -Discuss types of environmental irritants and how to limit exposure to environmental irritants. Flowsheet Row PULMONARY REHAB OTHER RESPIRATORY from 02/05/2020 in [place]  Date 01/15/20  Educator DJ  Instruction Review Code 1- Verbalizes Understanding      Meds/Inhalers and oxygen: - Discuss respiratory medications, definition of an inhaler and oxygen, and the proper way to use an inhaler and oxygen. Flowsheet Row PULMONARY REHAB OTHER RESPIRATORY from 02/05/2020 in [place]  Date 01/15/20  Educator DJ      Energy Saving Techniques: - Discuss methods to conserve energy and decrease shortness of breath when performing activities of daily living.  Flowsheet Row PULMONARY REHAB OTHER RESPIRATORY from 02/05/2020 in [place]  Date 01/29/20  Educator mk  Instruction Review Code 2- Demonstrated Understanding      Bronchial Hygiene / Breathing Techniques: - Discuss breathing mechanics, pursed-lip breathing technique,  proper posture, effective ways to clear airways, and other functional breathing techniques Flowsheet Row PULMONARY REHAB OTHER RESPIRATORY from 02/05/2020 in [place]  Date 02/05/20  Educator Hand out  Instruction Review Code 1- Verbalizes Understanding      Cleaning Equipment: - Provides group verbal and written instruction about the health risks of elevated stress, cause of high stress, and healthy ways to reduce stress.   Nutrition I: Fats: - Discuss the types of cholesterol, what cholesterol does to the body, and how cholesterol levels can be controlled.   Nutrition II: Labels: -Discuss the different components of food labels and how to read food labels.   Respiratory Infections: - Discuss the signs and symptoms of respiratory infections, ways to prevent respiratory infections, and the importance of seeking medical  treatment when having a respiratory infection.   Stress I: Signs and Symptoms: - Discuss the causes of stress, how stress may lead to anxiety and depression, and ways to limit stress.   Stress II: Relaxation: -Discuss relaxation techniques to limit stress.   Oxygen for Home/Travel: - Discuss how to prepare for travel when on oxygen and proper ways to transport and store oxygen to ensure safety.   Knowledge Questionnaire Score:  Knowledge Questionnaire Score - 01/09/20 1247      Knowledge Questionnaire Score   Pre Score 10/18           Core Components/Risk Factors/Patient Goals at Admission:  Personal Goals and Risk Factors at Admission - 01/09/20 1303      Core Components/Risk Factors/Patient Goals on Admission    Weight Management Yes;Weight Maintenance    Tobacco Cessation Yes    Number of packs per day takes him about a month to go through a pack currently    Intervention Assist the participant in steps to quit. Provide individualized education and counseling about committing to Tobacco Cessation, relapse prevention, and pharmacological support that can be provided by physician.;Advice worker, assist  with locating and accessing local/national Quit Smoking programs, and support quit date choice.   He is in a cessation program through the [place]   Expected Outcomes Short Term: Will demonstrate readiness to quit, by selecting a quit date.;Short Term: Will quit all tobacco product use, adhering to prevention of relapse plan.;Long Term: Complete abstinence from all tobacco products for at least 12 months from quit date.    Improve shortness of breath with ADL's Yes    Intervention Provide education, individualized exercise plan and daily activity instruction to help decrease symptoms of SOB with activities of daily living.    Expected Outcomes Short Term: Improve cardiorespiratory fitness to achieve a reduction of symptoms when performing ADLs;Long Term: Be able to  perform more ADLs without symptoms or delay the onset of symptoms           Core Components/Risk Factors/Patient Goals Review:   Goals and Risk Factor Review    Row Key 01/14/20 0945 02/06/20 0801           Core Components/Risk Factors/Patient Goals Review   Personal Goals Review Other;Tobacco Cessation Other;Tobacco Cessation      Review Patient is new to the program completing 2 sessions. He was referred to the pulmonary rehab program due to dyspnea on exertion. His personal goals for the program are to get stronger and have more energy; have less SOB. We will continue to monitor as he works toward meeting his goals. Patient has completed 6 sessions gaining 2 lbs since his initial visit. He is doing well in the program with progressions and consistent attendance. He says he feels the program is helping him get stronger. His personal goals are to get stronger and have more energy and less SOB. We will continue to monitor his progress as he works toward meeting these goals.      Expected Outcomes Patient will complete the program meeting both program and personal goals. Patient will complete the program meeting both program and personal goals.             Core Components/Risk Factors/Patient Goals at Discharge (Final Review):   Goals and Risk Factor Review - 02/06/20 0801      Core Components/Risk Factors/Patient Goals Review   Personal Goals Review Other;Tobacco Cessation    Review Patient has completed 6 sessions gaining 2 lbs since his initial visit. He is doing well in the program with progressions and consistent attendance. He says he feels the program is helping him get stronger. His personal goals are to get stronger and have more energy and less SOB. We will continue to monitor his progress as he works toward meeting these goals.    Expected Outcomes Patient will complete the program meeting both program and personal goals.           ITP Comments:   Comments: ITP  REVIEW Pt is making expected progress toward pulmonary rehab goals after completing 8 sessions. Recommend continued exercise, life style modification, education, and utilization of breathing techniques to increase stamina and strength and decrease shortness of breath with exertion.

## 2020-02-12 NOTE — Progress Notes (Signed)
Daily Session Note  Patient Details  Name: Trevor Key MRN: [id number] Date of Birth: 1955-11-05 Referring Provider:   Flowsheet Row PULMONARY REHAB OTHER RESP ORIENTATION from 01/09/2020 in [place]  Referring Provider Bobbye [place], NP      Encounter Date: 02/12/2020  Check In:  Session Check In - 02/12/20 1045      Check-In   Supervising physician immediately available to respond to emergencies [facility] MD immediately available    Physician(s) Dr. Harl Bowie    Location [facility]    Staff Present Cathren Harsh, MS, Exercise Physiologist;Teal Raben Wynetta Emery, RN, BSN    Virtual Visit No    Medication changes reported     No    Fall or balance concerns reported    No    Tobacco Cessation No Change    Warm-up and Cool-down Performed as group-led instruction    Resistance Training Performed Yes    VAD Patient? No    PAD/SET Patient? No      Pain Assessment   Currently in Pain? No/denies    Multiple Pain Sites No           Capillary Blood Glucose: No results found for this or any previous visit (from the past 24 hour(s)).    Social History   Tobacco Use  Smoking Status Current Every Day Smoker  . Packs/day: 0.25  . Years: 40.00  . Pack years: 10.00  . Types: Cigarettes  Smokeless Tobacco Never Used  Tobacco Comment   He is currently enrolled in a smoking cessation program through the [place]. He reports that it is working.    Goals Met:  Proper associated with RPD/PD & O2 Sat Independence with exercise equipment Improved SOB with ADL's Using PLB without cueing & demonstrates good technique Exercise tolerated well No report of cardiac concerns or symptoms Strength training completed today  Goals Unmet:  Not Applicable  Comments: Check 1145   Dr. Kathie Dike is Medical Director for [facility] Pulmonary Rehab.

## 2020-02-17 ENCOUNTER — Encounter (HOSPITAL_COMMUNITY)
Admission: RE | Admit: 2020-02-17 | Discharge: 2020-02-17 | Disposition: A | Discharge status: Home / Self Care | Payer: No Typology Code available for payment source | Source: Ambulatory Visit | Attending: Registered Nurse | Admitting: Registered Nurse

## 2020-02-17 ENCOUNTER — Other Ambulatory Visit: Payer: Self-pay

## 2020-02-17 DIAGNOSIS — R0600 Dyspnea, unspecified: Secondary | ICD-10-CM

## 2020-02-17 NOTE — Progress Notes (Signed)
Daily Session Note  Patient Details  Name: Trevor Key MRN: [id number] Date of Birth: 27-May-1955 Referring Provider:   Flowsheet Row PULMONARY REHAB OTHER RESP ORIENTATION from 01/09/2020 in [place]  Referring Provider Bobbye Charleston, NP      Encounter Date: 02/17/2020  Check In:  Session Check In - 02/17/20 1045      Check-In   Supervising physician immediately available to respond to emergencies [facility] MD immediately available    Physician(s) Johnsie Cancel    Location [facility]    Staff Present Geanie Cooley, RN;Madison Audria Nine, MS, Exercise Physiologist;Dalton Kris Mouton, MS, ACSM-CEP, Exercise Physiologist    Virtual Visit No    Medication changes reported     No    Fall or balance concerns reported    No    Tobacco Cessation No Change    Warm-up and Cool-down Performed as group-led instruction    Resistance Training Performed Yes    VAD Patient? No    PAD/SET Patient? No      Pain Assessment   Currently in Pain? No/denies    Multiple Pain Sites No           Capillary Blood Glucose: No results found for this or any previous visit (from the past 24 hour(s)).    Social History   Tobacco Use  Smoking Status Current Every Day Smoker  . Packs/day: 0.25  . Years: 40.00  . Pack years: 10.00  . Types: Cigarettes  Smokeless Tobacco Never Used  Tobacco Comment   He is currently enrolled in a smoking cessation program through the [place]. He reports that it is working.    Goals Met:  Proper associated with RPD/PD & O2 Sat Independence with exercise equipment Using PLB without cueing & demonstrates good technique Exercise tolerated well No report of cardiac concerns or symptoms Strength training completed today  Goals Unmet:  Not Applicable  Comments: check out @ 11:45am   Dr. Kathie Dike is Medical Director for [facility] Pulmonary Rehab.

## 2020-02-19 ENCOUNTER — Other Ambulatory Visit: Payer: Self-pay

## 2020-02-19 ENCOUNTER — Encounter (HOSPITAL_COMMUNITY)
Admission: RE | Admit: 2020-02-19 | Discharge: 2020-02-19 | Disposition: A | Discharge status: Home / Self Care | Payer: No Typology Code available for payment source | Source: Ambulatory Visit | Attending: Registered Nurse | Admitting: Registered Nurse

## 2020-02-19 DIAGNOSIS — R0600 Dyspnea, unspecified: Secondary | ICD-10-CM | POA: Diagnosis not present

## 2020-02-19 NOTE — Progress Notes (Signed)
Daily Session Note  Patient Details  Name: Trevor Key MRN: [id number] Date of Birth: Mar 22, 1955 Referring Provider:   Flowsheet Row PULMONARY REHAB OTHER RESP ORIENTATION from 01/09/2020 in [place]  Referring Provider Bobbye Charleston, NP      Encounter Date: 02/19/2020  Check In:  Session Check In - 02/19/20 1045      Check-In   Supervising physician immediately available to respond to emergencies [facility] MD immediately available    Physician(s) Johnsie Cancel    Location [facility]    Staff Present Aundra Dubin, RN, BSN;Madison Audria Nine, MS, Exercise Physiologist    Virtual Visit No    Medication changes reported     No    Fall or balance concerns reported    No    Tobacco Cessation No Change    Warm-up and Cool-down Performed as group-led instruction    Resistance Training Performed Yes    VAD Patient? No    PAD/SET Patient? No      Pain Assessment   Currently in Pain? No/denies    Multiple Pain Sites No           Capillary Blood Glucose: No results found for this or any previous visit (from the past 24 hour(s)).    Social History   Tobacco Use  Smoking Status Current Every Day Smoker  . Packs/day: 0.25  . Years: 40.00  . Pack years: 10.00  . Types: Cigarettes  Smokeless Tobacco Never Used  Tobacco Comment   He is currently enrolled in a smoking cessation program through the [place]. He reports that it is working.    Goals Met:  Proper associated with RPD/PD & O2 Sat Independence with exercise equipment Improved SOB with ADL's Using PLB without cueing & demonstrates good technique Exercise tolerated well No report of cardiac concerns or symptoms Strength training completed today  Goals Unmet:  Not Applicable  Comments: Check out 1145.   Dr. Kathie Dike is Medical Director for [facility] Pulmonary Rehab.

## 2020-02-24 ENCOUNTER — Other Ambulatory Visit: Payer: Self-pay

## 2020-02-24 ENCOUNTER — Encounter (HOSPITAL_COMMUNITY)
Admission: RE | Admit: 2020-02-24 | Discharge: 2020-02-24 | Disposition: A | Discharge status: Home / Self Care | Payer: No Typology Code available for payment source | Source: Ambulatory Visit | Attending: Registered Nurse | Admitting: Registered Nurse

## 2020-02-24 VITALS — Wt 172.0 lb

## 2020-02-24 DIAGNOSIS — R0600 Dyspnea, unspecified: Secondary | ICD-10-CM | POA: Diagnosis not present

## 2020-02-24 NOTE — Progress Notes (Signed)
I have reviewed a Home Exercise Prescription with Liz Beach . Roper is  currently exercising at home.  The patient was advised to walk 3 days a week for 30 minutes.  Jp and I discussed how to progress their exercise prescription. The patient stated he has no long term goals.  The patient stated that they understand the exercise prescription.  We reviewed exercise guidelines, target heart rate during exercise, RPE Scale, weather conditions, NTG use, endpoints for exercise, warmup and cool down.  Patient is encouraged to come to me with any questions. I will continue to follow up with the patient to assist them with progression and safety.

## 2020-02-24 NOTE — Progress Notes (Signed)
Daily Session Note  Patient Details  Name: Trevor Key MRN: [id number] Date of Birth: November 08, 1955 Referring Provider:   Flowsheet Row PULMONARY REHAB OTHER RESP ORIENTATION from 01/09/2020 in [place]  Referring Provider Bobbye Charleston, NP      Encounter Date: 02/24/2020  Check In:  Session Check In - 02/24/20 1045      Check-In   Supervising physician immediately available to respond to emergencies [facility] MD immediately available    Physician(s) Dr. Domenic Polite    Location [facility]    Staff Present Hoy Register, MS, ACSM-CEP, Exercise Physiologist;Madison Audria Nine, MS, Exercise Physiologist;Phyllis Billingsley, RN    Virtual Visit No    Medication changes reported     No    Fall or balance concerns reported    No    Tobacco Cessation No Change    Warm-up and Cool-down Performed as group-led instruction    Resistance Training Performed Yes    VAD Patient? No    PAD/SET Patient? No      Pain Assessment   Currently in Pain? No/denies    Multiple Pain Sites No           Capillary Blood Glucose: No results found for this or any previous visit (from the past 24 hour(s)).    Social History   Tobacco Use  Smoking Status Current Every Day Smoker  . Packs/day: 0.25  . Years: 40.00  . Pack years: 10.00  . Types: Cigarettes  Smokeless Tobacco Never Used  Tobacco Comment   He is currently enrolled in a smoking cessation program through the [place]. He reports that it is working.    Goals Met:  Independence with exercise equipment Exercise tolerated well No report of cardiac concerns or symptoms Strength training completed today  Goals Unmet:  Not Applicable  Comments: checkout time is 1145   Dr. Kathie Dike is Medical Director for [facility] Pulmonary Rehab.

## 2020-02-26 ENCOUNTER — Other Ambulatory Visit: Payer: Self-pay

## 2020-02-26 ENCOUNTER — Encounter (HOSPITAL_COMMUNITY)
Admission: RE | Admit: 2020-02-26 | Discharge: 2020-02-26 | Disposition: A | Discharge status: Home / Self Care | Payer: No Typology Code available for payment source | Source: Ambulatory Visit | Attending: Registered Nurse | Admitting: Registered Nurse

## 2020-02-26 DIAGNOSIS — R0600 Dyspnea, unspecified: Secondary | ICD-10-CM

## 2020-02-26 NOTE — Progress Notes (Signed)
Daily Session Note  Patient Details  Name: Trevor Key MRN: [id number] Date of Birth: 10/16/1955 Referring Provider:   Flowsheet Row PULMONARY REHAB OTHER RESP ORIENTATION from 01/09/2020 in [place]  Referring Provider Bobbye Charleston, NP      Encounter Date: 02/26/2020  Check In:  Session Check In - 02/26/20 1041      Check-In   Supervising physician immediately available to respond to emergencies [facility] MD immediately available    Physician(s) Dr. Domenic Polite    Location [facility]    Staff Present Aundra Dubin, RN, BSN;Madison Audria Nine, MS, Exercise Physiologist    Virtual Visit No    Medication changes reported     No    Fall or balance concerns reported    No    Tobacco Cessation No Change    Warm-up and Cool-down Performed as group-led instruction    Resistance Training Performed Yes    VAD Patient? No    PAD/SET Patient? No      Pain Assessment   Currently in Pain? No/denies    Multiple Pain Sites No           Capillary Blood Glucose: No results found for this or any previous visit (from the past 24 hour(s)).    Social History   Tobacco Use  Smoking Status Current Every Day Smoker  . Packs/day: 0.25  . Years: 40.00  . Pack years: 10.00  . Types: Cigarettes  Smokeless Tobacco Never Used  Tobacco Comment   He is currently enrolled in a smoking cessation program through the [place]. He reports that it is working.    Goals Met:  Proper associated with RPD/PD & O2 Sat Independence with exercise equipment Improved SOB with ADL's Using PLB without cueing & demonstrates good technique Exercise tolerated well No report of cardiac concerns or symptoms Strength training completed today  Goals Unmet:  Not Applicable  Comments: Check out 1145.   Dr. Kathie Dike is Medical Director for [facility] Pulmonary Rehab.

## 2020-03-02 ENCOUNTER — Other Ambulatory Visit: Payer: Self-pay

## 2020-03-02 ENCOUNTER — Encounter (HOSPITAL_COMMUNITY)
Admission: RE | Admit: 2020-03-02 | Discharge: 2020-03-02 | Disposition: A | Discharge status: Home / Self Care | Payer: No Typology Code available for payment source | Source: Ambulatory Visit | Attending: Registered Nurse | Admitting: Registered Nurse

## 2020-03-02 DIAGNOSIS — R0600 Dyspnea, unspecified: Secondary | ICD-10-CM | POA: Diagnosis not present

## 2020-03-02 NOTE — Progress Notes (Signed)
Daily Session Note  Patient Details  Name: Trevor Key MRN: [id number] Date of Birth: December 13, 1955 Referring Provider:   Flowsheet Row PULMONARY REHAB OTHER RESP ORIENTATION from 01/09/2020 in [place]  Referring Provider Bobbye Charleston, NP      Encounter Date: 03/02/2020  Check In:  Session Check In - 03/02/20 1045      Check-In   Supervising physician immediately available to respond to emergencies [facility] MD immediately available    Physician(s) Dr. Harl Bowie    Location [facility]    Staff Present Hoy Register, MS, ACSM-CEP, Exercise Physiologist;Madison Audria Nine, MS, Exercise Physiologist    Virtual Visit No    Medication changes reported     No    Fall or balance concerns reported    No    Tobacco Cessation No Change    Warm-up and Cool-down Performed as group-led instruction    Resistance Training Performed Yes    VAD Patient? No    PAD/SET Patient? No      Pain Assessment   Currently in Pain? No/denies    Multiple Pain Sites No           Capillary Blood Glucose: No results found for this or any previous visit (from the past 24 hour(s)).    Social History   Tobacco Use  Smoking Status Current Every Day Smoker  . Packs/day: 0.25  . Years: 40.00  . Pack years: 10.00  . Types: Cigarettes  Smokeless Tobacco Never Used  Tobacco Comment   He is currently enrolled in a smoking cessation program through the [place]. He reports that it is working.    Goals Met:  Independence with exercise equipment Exercise tolerated well No report of cardiac concerns or symptoms Strength training completed today  Goals Unmet:  Not Applicable  Comments: checkout time is 1145   Dr. Kathie Dike is Medical Director for [facility] Pulmonary Rehab.

## 2020-03-04 ENCOUNTER — Other Ambulatory Visit: Payer: Self-pay

## 2020-03-04 ENCOUNTER — Encounter (HOSPITAL_COMMUNITY)
Admission: RE | Admit: 2020-03-04 | Discharge: 2020-03-04 | Disposition: A | Discharge status: Home / Self Care | Payer: No Typology Code available for payment source | Source: Ambulatory Visit | Attending: Registered Nurse | Admitting: Registered Nurse

## 2020-03-04 DIAGNOSIS — R0600 Dyspnea, unspecified: Secondary | ICD-10-CM | POA: Diagnosis not present

## 2020-03-04 NOTE — Progress Notes (Signed)
Daily Session Note  Patient Details  Name: Trevor Key MRN: [id number] Date of Birth: 19-Sep-1955 Referring Provider:   Flowsheet Row PULMONARY REHAB OTHER RESP ORIENTATION from 01/09/2020 in [place]  Referring Provider Bobbye Charleston, NP      Encounter Date: 03/04/2020  Check In:  Session Check In - 03/04/20 1045      Check-In   Supervising physician immediately available to respond to emergencies [facility] MD immediately available    Physician(s) Dr. Harl Bowie    Location [facility]    Staff Present Aundra Dubin, RN, BSN;Madison Audria Nine, MS, Exercise Physiologist;Dalton Kris Mouton, MS, ACSM-CEP, Exercise Physiologist    Virtual Visit No    Medication changes reported     No    Fall or balance concerns reported    No    Tobacco Cessation No Change    Warm-up and Cool-down Performed as group-led instruction    Resistance Training Performed Yes    VAD Patient? No    PAD/SET Patient? No      Pain Assessment   Currently in Pain? No/denies    Multiple Pain Sites No           Capillary Blood Glucose: No results found for this or any previous visit (from the past 24 hour(s)).    Social History   Tobacco Use  Smoking Status Current Every Day Smoker  . Packs/day: 0.25  . Years: 40.00  . Pack years: 10.00  . Types: Cigarettes  Smokeless Tobacco Never Used  Tobacco Comment   He is currently enrolled in a smoking cessation program through the [place]. He reports that it is working.    Goals Met:  Proper associated with RPD/PD & O2 Sat Independence with exercise equipment Improved SOB with ADL's Exercise tolerated well No report of cardiac concerns or symptoms Strength training completed today  Goals Unmet:  Not Applicable  Comments: Check out 1145.   Dr. Kathie Dike is Medical Director for [facility] Pulmonary Rehab.

## 2020-03-09 ENCOUNTER — Encounter (HOSPITAL_COMMUNITY)
Admission: RE | Admit: 2020-03-09 | Discharge: 2020-03-09 | Disposition: A | Discharge status: Home / Self Care | Payer: No Typology Code available for payment source | Source: Ambulatory Visit | Attending: Registered Nurse | Admitting: Registered Nurse

## 2020-03-09 ENCOUNTER — Other Ambulatory Visit: Payer: Self-pay

## 2020-03-09 VITALS — Wt 169.1 lb

## 2020-03-09 DIAGNOSIS — R0600 Dyspnea, unspecified: Secondary | ICD-10-CM | POA: Diagnosis not present

## 2020-03-09 NOTE — Progress Notes (Signed)
Daily Session Note  Patient Details  Name: Trevor Key MRN: [id number] Date of Birth: 11/03/1955 Referring Provider:   Flowsheet Row PULMONARY REHAB OTHER RESP ORIENTATION from 01/09/2020 in [place] [facility]  Referring Provider Dennis Allison, NP      Encounter Date: 03/09/2020  Check In:  Session Check In - 03/09/20 1045      Check-In   Supervising physician immediately available to respond to emergencies [facility] MD immediately available    Physician(s) Dr. Branch    Location [facility]    Staff Present Madison Karch, MS, Exercise Physiologist;Dalton Fletcher, MS, ACSM-CEP, Exercise Physiologist    Virtual Visit No    Medication changes reported     No    Fall or balance concerns reported    No    Tobacco Cessation No Change    Warm-up and Cool-down Performed as group-led instruction    Resistance Training Performed Yes    VAD Patient? No    PAD/SET Patient? No      Pain Assessment   Currently in Pain? No/denies    Multiple Pain Sites No           Capillary Blood Glucose: No results found for this or any previous visit (from the past 24 hour(s)).    Social History   Tobacco Use  Smoking Status Current Every Day Smoker  . Packs/day: 0.25  . Years: 40.00  . Pack years: 10.00  . Types: Cigarettes  Smokeless Tobacco Never Used  Tobacco Comment   He is currently [facility]. He reports that it is working.    Goals Met:  Independence with exercise equipment Exercise tolerated well No report of cardiac concerns or symptoms Strength training completed today  Goals Unmet:  Not Applicable  Comments: checkout time is 1145   Dr. Jehanzeb Memon is Medical Director for [facility]. 

## 2020-03-11 ENCOUNTER — Encounter (HOSPITAL_COMMUNITY)
Admission: RE | Admit: 2020-03-11 | Discharge: 2020-03-11 | Disposition: A | Discharge status: Home / Self Care | Payer: No Typology Code available for payment source | Source: Ambulatory Visit | Attending: Registered Nurse | Admitting: Registered Nurse

## 2020-03-11 ENCOUNTER — Other Ambulatory Visit: Payer: Self-pay

## 2020-03-11 DIAGNOSIS — R0600 Dyspnea, unspecified: Secondary | ICD-10-CM

## 2020-03-11 NOTE — Progress Notes (Signed)
Pulmonary Individual Treatment Plan  Patient Details  Name: Trevor Key MRN: [id number] Date of Birth: Oct 05, 1955 Referring Provider:   Flowsheet Row PULMONARY REHAB OTHER RESP ORIENTATION from 01/09/2020 in [facility]  Referring Provider Bobbye Charleston, NP      Initial Encounter Date:  [place] OTHER RESP ORIENTATION from 01/09/2020 in [facility]  Date 01/09/20      Visit Diagnosis: Dyspnea, unspecified type  Patient's Home Medications on Admission:   Current Outpatient Medications:  .  albuterol (PROVENTIL HFA;VENTOLIN HFA) 108 (90 BASE) MCG/ACT inhaler, Inhale 1-2 puffs into the lungs every 6 (six) hours as needed for wheezing., Disp: 1 Inhaler, Rfl: 0 .  aspirin 81 MG chewable tablet, Chew 81 mg by mouth daily., Disp: , Rfl:  .  atorvastatin (LIPITOR) 80 MG tablet, Take 80 mg by mouth daily., Disp: , Rfl:  .  cholecalciferol (VITAMIN D3) 25 MCG (1000 UNIT) tablet, Take 1,000 Units by mouth daily., Disp: , Rfl:  .  fluticasone furoate-vilanterol (BREO ELLIPTA) 100-25 MCG/INH AEPB, Inhale 1 puff into the lungs daily., Disp: , Rfl:  .  losartan (COZAAR) 100 MG tablet, Take 100 mg by mouth daily., Disp: , Rfl:  .  metoprolol succinate (TOPROL-XL) 50 MG 24 hr tablet, Take 1 tablet (50 mg total) by mouth daily. Take with or immediately following a meal., Disp: 90 tablet, Rfl: 3 .  metoprolol tartrate (LOPRESSOR) 25 MG tablet, Take 25 mg by mouth 2 (two) times daily., Disp: , Rfl:  .  nitroGLYCERIN (NITROLINGUAL) 0.4 MG/SPRAY spray, Place 1 spray under the tongue every 5 (five) minutes as needed. For chest pain., Disp: , Rfl:  .  omeprazole (PRILOSEC) 20 MG capsule, Take 20 mg by mouth daily., Disp: , Rfl:  .  primidone (MYSOLINE) 50 MG tablet, Take 50 mg by mouth daily., Disp: , Rfl:  .  tiotropium (SPIRIVA) 18 MCG inhalation capsule, Place 18 mcg into inhaler and inhale daily., Disp: , Rfl:   Past Medical History: Past  Medical History:  Diagnosis Date  . Coronary atherosclerosis of native coronary artery    BMS LAD 1/13, LVEF 55-60%  . Hypercholesteremia   . Hypertension   . MI (myocardial infarction) ([place])    AMI 1/13    Tobacco Use: Social History   Tobacco Use  Smoking Status Current Every Day Smoker  . Packs/day: 0.25  . Years: 40.00  . Pack years: 10.00  . Types: Cigarettes  Smokeless Tobacco Never Used  Tobacco Comment   He is currently enrolled in a smoking cessation program through the [place]. He reports that it is working.    Labs: Recent Review Flowsheet Data    Labs for ITP Cardiac and Pulmonary Rehab Latest Ref Rng & Units 03/27/2011 10/28/2011 05/19/2016 05/20/2016   Cholestrol 0 - 200 mg/dL 118 - - 123   LDLCALC 0 - 99 mg/dL 66 - - 71   HDL >40 mg/dL 33(L) - - 33(L)   Trlycerides <150 mg/dL 96 - - 93   Hemoglobin A1c 4.8 - 5.6 % - - - 5.9(H)   TCO2 0 - 100 mmol/L - 26 28 -      Capillary Blood Glucose: No results found for: GLUCAP   Pulmonary Assessment Scores:  Pulmonary Assessment Scores    Row Name 01/09/20 1248         ADL UCSD   SOB Score total 48           CAT Score  CAT Score 22           mMRC Score   mMRC Score 3           UCSD: Self-administered rating of dyspnea associated with activities of daily living (ADLs) 6-point scale (0 = "not at all" to 5 = "maximal or unable to do because of breathlessness")  Scoring Scores range from 0 to 120.  Minimally important difference is 5 units  CAT: CAT can identify the health impairment of COPD patients and is better correlated with disease progression.  CAT has a scoring range of zero to 40. The CAT score is classified into four groups of low (less than 10), medium (10 - 20), high (21-30) and very high (31-40) based on the impact level of disease on health status. A CAT score over 10 suggests significant symptoms.  A worsening CAT score could be explained by an exacerbation, poor medication adherence, poor  inhaler technique, or progression of COPD or comorbid conditions.  CAT MCID is 2 points  mMRC: mMRC (Modified Medical Research Council) Dyspnea Scale is used to assess the degree of baseline functional disability in patients of respiratory disease due to dyspnea. No minimal important difference is established. A decrease in score of 1 point or greater is considered a positive change.   Pulmonary Function Assessment:  Pulmonary Function Assessment - 01/09/20 1306      Breath   Shortness of Breath Yes;Limiting activity           Exercise Target Goals: Exercise Program Goal: Individual exercise prescription set using results from initial 6 min walk test and THRR while considering  patient's activity barriers and safety.   Exercise Prescription Goal: Initial exercise prescription builds to 30-45 minutes a day of aerobic activity, 2-3 days per week.  Home exercise guidelines will be given to patient during program as part of exercise prescription that the participant will acknowledge.  Activity Barriers & Risk Stratification:  Activity Barriers & Cardiac Risk Stratification - 01/09/20 1305      Activity Barriers & Cardiac Risk Stratification   Activity Barriers Deconditioning;Shortness of Breath    Cardiac Risk Stratification High           6 Minute Walk:  6 Minute Walk    Row Name 01/09/20 1419         6 Minute Walk   Phase Initial     Distance 1400 feet     Walk Time 6 minutes     # of Rest Breaks 0     MPH 2.7     METS 3.39     RPE 9     Perceived Dyspnea  15     VO2 Peak 11.88     Symptoms Yes (comment)     Comments Complained of Shortness of Breath     Resting HR 59 bpm     Resting BP 144/80     Resting Oxygen Saturation  98 %     Exercise Oxygen Saturation  during 6 min walk 95 %     Max Ex. HR 102 bpm     Max Ex. BP 162/82     2 Minute Post BP 158/78           Interval HR   1 Minute HR 59     2 Minute HR 92     3 Minute HR 94     4 Minute HR 93      5 Minute HR 96  6 Minute HR 102     2 Minute Post HR 98     Interval Heart Rate? Yes           Interval Oxygen   Interval Oxygen? Yes     Baseline Oxygen Saturation % 98 %     1 Minute Oxygen Saturation % 95 %     1 Minute Liters of Oxygen 0 L     2 Minute Oxygen Saturation % 97 %     2 Minute Liters of Oxygen 0 L     3 Minute Oxygen Saturation % 97 %     3 Minute Liters of Oxygen 0 L     4 Minute Oxygen Saturation % 97 %     4 Minute Liters of Oxygen 0 L     5 Minute Oxygen Saturation % 97 %     5 Minute Liters of Oxygen 0 L     6 Minute Oxygen Saturation % 96 %     6 Minute Liters of Oxygen 0 L     2 Minute Post Oxygen Saturation % 98 %     2 Minute Post Liters of Oxygen 0 L            Oxygen Initial Assessment:  Oxygen Initial Assessment - 01/09/20 1305      Home Oxygen   Home Oxygen Device None    Sleep Oxygen Prescription CPAP    Liters per minute 0    Home Exercise Oxygen Prescription None    Home Resting Oxygen Prescription None    Compliance with Home Oxygen Use Yes           Oxygen Re-Evaluation:  Oxygen Re-Evaluation    Row Name 01/13/20 1157 02/10/20 1218 03/09/20 1211         Program Oxygen Prescription   Program Oxygen Prescription None None None           Home Oxygen   Home Oxygen Device None None None     Sleep Oxygen Prescription CPAP CPAP CPAP     Liters per minute 0 0 0     Home Exercise Oxygen Prescription None None None     Home Resting Oxygen Prescription None None None     Compliance with Home Oxygen Use Yes Yes Yes           Goals/Expected Outcomes   Short Term Goals To learn and exhibit compliance with exercise, home and travel O2 prescription;To learn and understand importance of monitoring SPO2 with pulse oximeter and demonstrate accurate use of the pulse oximeter.;To learn and understand importance of maintaining oxygen saturations>88%;To learn and demonstrate proper pursed lip breathing techniques or other breathing  techniques.;To learn and demonstrate proper use of respiratory medications To learn and exhibit compliance with exercise, home and travel O2 prescription;To learn and understand importance of monitoring SPO2 with pulse oximeter and demonstrate accurate use of the pulse oximeter.;To learn and understand importance of maintaining oxygen saturations>88%;To learn and demonstrate proper pursed lip breathing techniques or other breathing techniques.;To learn and demonstrate proper use of respiratory medications To learn and exhibit compliance with exercise, home and travel O2 prescription;To learn and understand importance of monitoring SPO2 with pulse oximeter and demonstrate accurate use of the pulse oximeter.;To learn and understand importance of maintaining oxygen saturations>88%;To learn and demonstrate proper pursed lip breathing techniques or other breathing techniques.;To learn and demonstrate proper use of respiratory medications     Long  Term Goals Exhibits compliance with exercise,  home and travel O2 prescription;Verbalizes importance of monitoring SPO2 with pulse oximeter and return demonstration;Maintenance of O2 saturations>88%;Exhibits proper breathing techniques, such as pursed lip breathing or other method taught during program session;Compliance with respiratory medication;Demonstrates proper use of MDI's Exhibits compliance with exercise, home and travel O2 prescription;Verbalizes importance of monitoring SPO2 with pulse oximeter and return demonstration;Maintenance of O2 saturations>88%;Exhibits proper breathing techniques, such as pursed lip breathing or other method taught during program session;Compliance with respiratory medication;Demonstrates proper use of MDI's Exhibits compliance with exercise, home and travel O2 prescription;Verbalizes importance of monitoring SPO2 with pulse oximeter and return demonstration;Maintenance of O2 saturations>88%;Exhibits proper breathing techniques, such  as pursed lip breathing or other method taught during program session;Compliance with respiratory medication;Demonstrates proper use of MDI's     Comments Patient is doing well with oxygen saturation with no complaints. Patient is doing well with oxygen saturation with no complaints. Patient is doing well with oxygen saturation with no complaints.     Goals/Expected Outcomes Compliance Compliance Compliance            Oxygen Discharge (Final Oxygen Re-Evaluation):  Oxygen Re-Evaluation - 03/09/20 1211      Program Oxygen Prescription   Program Oxygen Prescription None      Home Oxygen   Home Oxygen Device None    Sleep Oxygen Prescription CPAP    Liters per minute 0    Home Exercise Oxygen Prescription None    Home Resting Oxygen Prescription None    Compliance with Home Oxygen Use Yes      Goals/Expected Outcomes   Short Term Goals To learn and exhibit compliance with exercise, home and travel O2 prescription;To learn and understand importance of monitoring SPO2 with pulse oximeter and demonstrate accurate use of the pulse oximeter.;To learn and understand importance of maintaining oxygen saturations>88%;To learn and demonstrate proper pursed lip breathing techniques or other breathing techniques.;To learn and demonstrate proper use of respiratory medications    Long  Term Goals Exhibits compliance with exercise, home and travel O2 prescription;Verbalizes importance of monitoring SPO2 with pulse oximeter and return demonstration;Maintenance of O2 saturations>88%;Exhibits proper breathing techniques, such as pursed lip breathing or other method taught during program session;Compliance with respiratory medication;Demonstrates proper use of MDI's    Comments Patient is doing well with oxygen saturation with no complaints.    Goals/Expected Outcomes Compliance           Initial Exercise Prescription:  Initial Exercise Prescription - 01/09/20 1400      Date of Initial Exercise RX and  Referring Provider   Date 01/09/20    Referring Provider Bobbye Charleston, NP    Expected Discharge Date 05/13/20      Treadmill   MPH 1.6    Grade 0    Minutes 17      Recumbant Elliptical   Level 1    RPM 60    Minutes 22      Prescription Details   Frequency (times per week) 2    Duration Progress to 30 minutes of continuous aerobic without signs/symptoms of physical distress      Intensity   THRR 40-80% of Max Heartrate 62-125    Ratings of Perceived Exertion 11-13    Perceived Dyspnea 0-4      Progression   Progression Continue progressive overload as per policy without signs/symptoms or physical distress.      Resistance Training   Training Prescription Yes    Weight 3    Reps 10-15  Perform Capillary Blood Glucose checks as needed.  Exercise Prescription Changes:   Exercise Prescription Changes    Row Name 01/13/20 1100 01/27/20 1200 02/10/20 1200 02/24/20 1200 03/09/20 1200     Response to Exercise   Blood Pressure (Admit) 172/88 148/80 152/98 152/80 150/88   Blood Pressure (Exercise) 180/90 174/72 154/88 158/88 166/82   Blood Pressure (Exit) 168/72 154/86 160/88 150/82 144/88   Heart Rate (Admit) 64 bpm 70 bpm 64 bpm 66 bpm 61 bpm   Heart Rate (Exercise) 80 bpm 91 bpm 87 bpm 95 bpm 87 bpm   Heart Rate (Exit) 68 bpm 72 bpm 64 bpm 88 bpm 65 bpm   Oxygen Saturation (Admit) 97 % 99 % 98 % 99 % 97 %   Oxygen Saturation (Exercise) 96 % 98 % 98 % 97 % 95 %   Oxygen Saturation (Exit) 97 % 98 % 98 % 98 % 97 %   Rating of Perceived Exertion (Exercise) 11 13 10 11 10    Perceived Dyspnea (Exercise) 13 13 10 11 10    Duration Progress to 30 minutes of  aerobic without signs/symptoms of physical distress Continue with 30 min of aerobic exercise without signs/symptoms of physical distress. Continue with 30 min of aerobic exercise without signs/symptoms of physical distress. Continue with 30 min of aerobic exercise without signs/symptoms of physical distress.  Continue with 30 min of aerobic exercise without signs/symptoms of physical distress.   Intensity THRR unchanged THRR unchanged THRR unchanged THRR unchanged THRR unchanged     Progression   Progression Continue to progress workloads to maintain intensity without signs/symptoms of physical distress. Continue to progress workloads to maintain intensity without signs/symptoms of physical distress. Continue to progress workloads to maintain intensity without signs/symptoms of physical distress. Continue to progress workloads to maintain intensity without signs/symptoms of physical distress. Continue to progress workloads to maintain intensity without signs/symptoms of physical distress.     Resistance Training   Training Prescription Yes Yes Yes Yes Yes   Weight 3 3 5  lbs 5 lbs 5 lbs   Reps 10-15 10-15 10-15 10-15 10-15   Time - 10 Minutes 10 Minutes 10 Minutes 10 Minutes     Treadmill   MPH 1.6 1.6 1.6 1.9 1.9   Grade 0 0 1 2 2    Minutes 17 17 17 17 17    METs 2.2 2.2 2.4 2.97 2.97     Recumbant Elliptical   Level 1 2 2 2 2    RPM 42 43 46 51 50   Minutes 22 22 22 22 22    METs 1.5 1.8 2.5 2.9 3     Home Exercise Plan   Plans to continue exercise at - - - Home (comment) -   Frequency - - - Add 3 additional days to program exercise sessions. -   Initial Home Exercises Provided - - - 02/24/20 -          Exercise Comments:   Exercise Comments    Row Name 01/13/20 1156 02/24/20 1244         Exercise Comments Patient completed first exercise session today. He has been through the program before, so the transition was very easy. He tolerated exercise well. Reviewed home exercise with patient. He is walking at home and is going to continue to walk.             Exercise Goals and Review:   Exercise Goals    Row Name 01/09/20 1423 02/10/20 1215 03/09/20 1203  Exercise Goals   Increase Physical Activity Yes Yes Yes     Intervention Provide advice, education, support and  counseling about physical activity/exercise needs.;Develop an individualized exercise prescription for aerobic and resistive training based on initial evaluation findings, risk stratification, comorbidities and participant's personal goals. Provide advice, education, support and counseling about physical activity/exercise needs.;Develop an individualized exercise prescription for aerobic and resistive training based on initial evaluation findings, risk stratification, comorbidities and participant's personal goals. Provide advice, education, support and counseling about physical activity/exercise needs.;Develop an individualized exercise prescription for aerobic and resistive training based on initial evaluation findings, risk stratification, comorbidities and participant's personal goals.     Expected Outcomes Short Term: Attend rehab on a regular basis to increase amount of physical activity.;Long Term: Add in home exercise to make exercise part of routine and to increase amount of physical activity.;Long Term: Exercising regularly at least 3-5 days a week. Short Term: Attend rehab on a regular basis to increase amount of physical activity.;Long Term: Add in home exercise to make exercise part of routine and to increase amount of physical activity.;Long Term: Exercising regularly at least 3-5 days a week. Short Term: Attend rehab on a regular basis to increase amount of physical activity.;Long Term: Add in home exercise to make exercise part of routine and to increase amount of physical activity.;Long Term: Exercising regularly at least 3-5 days a week.     Increase Strength and Stamina Yes Yes Yes     Intervention Provide advice, education, support and counseling about physical activity/exercise needs.;Develop an individualized exercise prescription for aerobic and resistive training based on initial evaluation findings, risk stratification, comorbidities and participant's personal goals. Provide advice,  education, support and counseling about physical activity/exercise needs.;Develop an individualized exercise prescription for aerobic and resistive training based on initial evaluation findings, risk stratification, comorbidities and participant's personal goals. Provide advice, education, support and counseling about physical activity/exercise needs.;Develop an individualized exercise prescription for aerobic and resistive training based on initial evaluation findings, risk stratification, comorbidities and participant's personal goals.     Expected Outcomes Short Term: Increase workloads from initial exercise prescription for resistance, speed, and METs.;Short Term: Perform resistance training exercises routinely during rehab and add in resistance training at home;Long Term: Improve cardiorespiratory fitness, muscular endurance and strength as measured by increased METs and functional capacity (6MWT) Short Term: Increase workloads from initial exercise prescription for resistance, speed, and METs.;Short Term: Perform resistance training exercises routinely during rehab and add in resistance training at home;Long Term: Improve cardiorespiratory fitness, muscular endurance and strength as measured by increased METs and functional capacity (6MWT) Short Term: Increase workloads from initial exercise prescription for resistance, speed, and METs.;Short Term: Perform resistance training exercises routinely during rehab and add in resistance training at home;Long Term: Improve cardiorespiratory fitness, muscular endurance and strength as measured by increased METs and functional capacity (6MWT)     Able to understand and use rate of perceived exertion (RPE) scale Yes Yes Yes     Intervention Provide education and explanation on how to use RPE scale Provide education and explanation on how to use RPE scale Provide education and explanation on how to use RPE scale     Expected Outcomes Short Term: Able to use RPE daily  in rehab to express subjective intensity level;Long Term:  Able to use RPE to guide intensity level when exercising independently Short Term: Able to use RPE daily in rehab to express subjective intensity level;Long Term:  Able to use RPE to guide intensity level  when exercising independently Short Term: Able to use RPE daily in rehab to express subjective intensity level;Long Term:  Able to use RPE to guide intensity level when exercising independently     Able to understand and use Dyspnea scale Yes Yes Yes     Intervention Provide education and explanation on how to use Dyspnea scale Provide education and explanation on how to use Dyspnea scale Provide education and explanation on how to use Dyspnea scale     Expected Outcomes Short Term: Able to use Dyspnea scale daily in rehab to express subjective sense of shortness of breath during exertion;Long Term: Able to use Dyspnea scale to guide intensity level when exercising independently Short Term: Able to use Dyspnea scale daily in rehab to express subjective sense of shortness of breath during exertion;Long Term: Able to use Dyspnea scale to guide intensity level when exercising independently Short Term: Able to use Dyspnea scale daily in rehab to express subjective sense of shortness of breath during exertion;Long Term: Able to use Dyspnea scale to guide intensity level when exercising independently     Knowledge and understanding of Target Heart Rate Range (THRR) Yes Yes Yes     Intervention Provide education and explanation of THRR including how the numbers were predicted and where they are located for reference Provide education and explanation of THRR including how the numbers were predicted and where they are located for reference Provide education and explanation of THRR including how the numbers were predicted and where they are located for reference     Expected Outcomes Short Term: Able to state/look up THRR;Short Term: Able to use daily as  guideline for intensity in rehab;Long Term: Able to use THRR to govern intensity when exercising independently Short Term: Able to state/look up THRR;Short Term: Able to use daily as guideline for intensity in rehab;Long Term: Able to use THRR to govern intensity when exercising independently Short Term: Able to state/look up THRR;Short Term: Able to use daily as guideline for intensity in rehab;Long Term: Able to use THRR to govern intensity when exercising independently     Able to check pulse independently Yes Yes Yes     Intervention Provide education and demonstration on how to check pulse in carotid and radial arteries.;Review the importance of being able to check your own pulse for safety during independent exercise Provide education and demonstration on how to check pulse in carotid and radial arteries.;Review the importance of being able to check your own pulse for safety during independent exercise Provide education and demonstration on how to check pulse in carotid and radial arteries.;Review the importance of being able to check your own pulse for safety during independent exercise     Expected Outcomes Short Term: Able to explain why pulse checking is important during independent exercise;Long Term: Able to check pulse independently and accurately Short Term: Able to explain why pulse checking is important during independent exercise;Long Term: Able to check pulse independently and accurately Short Term: Able to explain why pulse checking is important during independent exercise;Long Term: Able to check pulse independently and accurately     Understanding of Exercise Prescription Yes Yes Yes     Intervention Provide education, explanation, and written materials on patient's individual exercise prescription Provide education, explanation, and written materials on patient's individual exercise prescription Provide education, explanation, and written materials on patient's individual exercise  prescription     Expected Outcomes Short Term: Able to explain program exercise prescription;Long Term: Able to explain home exercise prescription  to exercise independently Short Term: Able to explain program exercise prescription;Long Term: Able to explain home exercise prescription to exercise independently Short Term: Able to explain program exercise prescription;Long Term: Able to explain home exercise prescription to exercise independently            Exercise Goals Re-Evaluation :  Exercise Goals Re-Evaluation    [place] Name 01/13/20 1152 02/10/20 1215 03/09/20 1204         Exercise Goal Re-Evaluation   Exercise Goals Review Increase Physical Activity;Increase Strength and Stamina;Able to understand and use rate of perceived exertion (RPE) scale;Able to understand and use Dyspnea scale;Knowledge and understanding of Target Heart Rate Range (THRR);Able to check pulse independently;Understanding of Exercise Prescription Increase Physical Activity;Increase Strength and Stamina;Able to understand and use rate of perceived exertion (RPE) scale;Able to understand and use Dyspnea scale;Knowledge and understanding of Target Heart Rate Range (THRR);Able to check pulse independently;Understanding of Exercise Prescription Increase Physical Activity;Increase Strength and Stamina;Able to understand and use rate of perceived exertion (RPE) scale;Able to understand and use Dyspnea scale;Knowledge and understanding of Target Heart Rate Range (THRR);Able to check pulse independently;Understanding of Exercise Prescription     Comments Patient has completed 1 exercise session. He has been through the program before so it was a very easy transition for him. He tolerated exercise well. He is very positive and engaging when he comes to rehab. He is exercising at 1.5 METs on the elliptical. Will continue to progress exercise as able. Patient has completed 7 exercise sessions. He is progressing well. He has gotten stronger  and has been able to increase his intensities in resistance training and in aerobic training. He is always very positive to come to rehab and engages with the staff andother patients. He is currently exercising at 2.5 METs on the elliptical. Will continue to progress as able. Patient has completed 15 exercise sessions. He is progressing well and says that he feels the program is helping him with ADLs. He is always ver positive when he comes to rehab. He is exercising at home by walking. He is currently exercising at 3 METs on the elliptical. Will continue to progress as able.     Expected Outcomes Through exercise at rehab and with a home exercise program, patient will reach their goals. Through exercise at rehab and with a home exercise program, patient will reach their goals. Through exercise at rehab and with a home exercise program, patient will reach their goals.            Discharge Exercise Prescription (Final Exercise Prescription Changes):  Exercise Prescription Changes - 03/09/20 1200      Response to Exercise   Blood Pressure (Admit) 150/88    Blood Pressure (Exercise) 166/82    Blood Pressure (Exit) 144/88    Heart Rate (Admit) 61 bpm    Heart Rate (Exercise) 87 bpm    Heart Rate (Exit) 65 bpm    Oxygen Saturation (Admit) 97 %    Oxygen Saturation (Exercise) 95 %    Oxygen Saturation (Exit) 97 %    Rating of Perceived Exertion (Exercise) 10    Perceived Dyspnea (Exercise) 10    Duration Continue with 30 min of aerobic exercise without signs/symptoms of physical distress.    Intensity THRR unchanged      Progression   Progression Continue to progress workloads to maintain intensity without signs/symptoms of physical distress.      Resistance Training   Training Prescription Yes    Weight 5 lbs  Reps 10-15    Time 10 Minutes      Treadmill   MPH 1.9    Grade 2    Minutes 17    METs 2.97      Recumbant Elliptical   Level 2    RPM 50    Minutes 22    METs 3            Nutrition:  Target Goals: Understanding of nutrition guidelines, daily intake of sodium 1500mg , cholesterol 200mg , calories 30% from fat and 7% or less from saturated fats, daily to have 5 or more servings of fruits and vegetables.  Biometrics:  Pre Biometrics - 03/09/20 1206      Pre Biometrics   Weight 76.7 kg            Nutrition Therapy Plan and Nutrition Goals:  Nutrition Therapy & Goals - 03/04/20 1355      Personal Nutrition Goals   Comments We will continue to provide nutritional education through handouts.      Intervention Plan   Intervention Nutrition handout(s) given to patient.           Nutrition Assessments:  Nutrition Assessments - 01/09/20 1311      MEDFICTS Scores   Pre Score 63          MEDIFICTS Score Key:  ?70 Need to make dietary changes   40-70 Heart Healthy Diet  ? 40 Therapeutic Level Cholesterol Diet   Picture Your Plate Scores:  <37 Unhealthy dietary pattern with much room for improvement.  41-50 Dietary pattern unlikely to meet recommendations for good health and room for improvement.  51-60 More healthful dietary pattern, with some room for improvement.   >60 Healthy dietary pattern, although there may be some specific behaviors that could be improved.    Nutrition Goals Re-Evaluation:   Nutrition Goals Discharge (Final Nutrition Goals Re-Evaluation):   Psychosocial: Target Goals: Acknowledge presence or absence of significant depression and/or stress, maximize coping skills, provide positive support system. Participant is able to verbalize types and ability to use techniques and skills needed for reducing stress and depression.  Initial Review & Psychosocial Screening:  Initial Psych Review & Screening - 01/09/20 1259      Initial Review   Current issues with None Identified      Family Dynamics   Good Support System? Yes    Comments He has three sisters who check up on him frequently. His wife calls  him multiple times per day to check up on him. His brother stops by to see him everyday after work. He sees his 3 children everyday. He has a very positive support system.      Barriers   Psychosocial barriers to participate in program There are no identifiable barriers or psychosocial needs.      Screening Interventions   Interventions Encouraged to exercise    Expected Outcomes Long Term goal: The participant improves quality of Life and PHQ9 Scores as seen by post scores and/or verbalization of changes           Quality of Life Scores:  Quality of Life - 01/09/20 1432      Quality of Life   Select Quality of Life      Quality of Life Scores   Health/Function Pre 27.54 %    Socioeconomic Pre 27.75 %    Psych/Spiritual Pre 30 %    Family Pre 30 %    GLOBAL Pre 28.52 %  Scores of 19 and below usually indicate a poorer quality of life in these areas.  A difference of  2-3 points is a clinically meaningful difference.  A difference of 2-3 points in the total score of the Quality of Life Index has been associated with significant improvement in overall quality of life, self-image, physical symptoms, and general health in studies assessing change in quality of life.   PHQ-9: Recent Review Flowsheet Data    Depression screen [facility] 2/9 01/09/2020 02/07/2011   Decreased Interest 0 0   Down, Depressed, Hopeless 0 0   PHQ - 2 Score 0 0   Altered sleeping 1 -   Tired, decreased energy 1 -   Change in appetite 0 -   Feeling bad or failure about yourself  0 -   Trouble concentrating 0 -   Moving slowly or fidgety/restless 0 -   Suicidal thoughts 0 -   PHQ-9 Score 2 -   Difficult doing work/chores Not difficult at all -     Interpretation of Total Score  Total Score Depression Severity:  1-4 = Minimal depression, 5-9 = Mild depression, 10-14 = Moderate depression, 15-19 = Moderately severe depression, 20-27 = Severe depression   Psychosocial Evaluation and Intervention:   Psychosocial Evaluation - 01/09/20 1418      Psychosocial Evaluation & Interventions   Interventions Encouraged to exercise with the program and follow exercise prescription    Comments At orientation, patient has no identifiable psychosocial issues. He reports that he has a great support system between his family and the [employer]. His PHQ-9 was a 2 and his QOL was a 28.52.    Expected Outcomes Patient will continue to not have any psychological issues for the duration of the program.    Continue Psychosocial Services  No Follow up required           Psychosocial Re-Evaluation:  Psychosocial Re-Evaluation    [place] Name 01/14/20 0944 02/06/20 0800 03/04/20 1349         Psychosocial Re-Evaluation   Current issues with None Identified None Identified None Identified     Comments Patient is new to the program completing 2 sessions. He continues to have no psychosocial issues identified. Will continue to monitor. Patient is new to the program completing 6 sessions. He continues to have no psychosocial issues identified. He seems to enjoy coming to class and is very interactive with others in his class and with staff. He demonstrates a very positive outlook and attitude and continues to work partime for [employer]. Will continue to monitor. Patient attends program on a regular basics and completing 14 sessions. He continues to have no psychosocial issues identified. He seems to enjoy coming to class and is very interactive with others in his class and with staff. He demonstrates a very positive outlook and attitude and continues to work partime for [employer]. Will continue to monitor.     Expected Outcomes Patient will have no psychosocial issues identified at [place]. - Patient will have no psychosocial issues identified at [place].     Interventions Relaxation education;Stress management education;Encouraged to attend Pulmonary Rehabilitation for  the exercise Relaxation education;Stress management education;Encouraged to attend Pulmonary Rehabilitation for the exercise Relaxation education;Stress management education;Encouraged to attend Pulmonary Rehabilitation for the exercise     Continue Psychosocial Services  No Follow up required No Follow up required No Follow up required            Psychosocial Discharge (Final  Psychosocial Re-Evaluation):  Psychosocial Re-Evaluation - 03/04/20 1349      Psychosocial Re-Evaluation   Current issues with None Identified    Comments Patient attends program on a regular basics and completing 14 sessions. He continues to have no psychosocial issues identified. He seems to enjoy coming to class and is very interactive with others in his class and with staff. He demonstrates a very positive outlook and attitude and continues to work partime for [employer]. Will continue to monitor.    Expected Outcomes Patient will have no psychosocial issues identified at [place].    Interventions Relaxation education;Stress management education;Encouraged to attend Pulmonary Rehabilitation for the exercise    Continue Psychosocial Services  No Follow up required            Education: Education Goals: Education classes will be provided on a weekly basis, covering required topics. Participant will state understanding/return demonstration of topics presented.  Learning Barriers/Preferences:  Learning Barriers/Preferences - 01/09/20 1303      Learning Barriers/Preferences   Learning Barriers None    Learning Preferences Skilled Demonstration           Education Topics: How Lungs Work and Diseases: - Discuss the anatomy of the lungs and diseases that can affect the lungs, such as COPD.   Exercise: -Discuss the importance of exercise, FITT principles of exercise, normal and abnormal responses to exercise, and how to exercise safely.   Environmental Irritants: -Discuss types of  environmental irritants and how to limit exposure to environmental irritants. Flowsheet Row PULMONARY REHAB OTHER RESPIRATORY from 03/04/2020 in [place]  Date 01/15/20  Educator DJ  Instruction Review Code 1- Verbalizes Understanding      Meds/Inhalers and oxygen: - Discuss respiratory medications, definition of an inhaler and oxygen, and the proper way to use an inhaler and oxygen. Flowsheet Row PULMONARY REHAB OTHER RESPIRATORY from 03/04/2020 in [place]  Date 01/15/20  Educator DJ      Energy Saving Techniques: - Discuss methods to conserve energy and decrease shortness of breath when performing activities of daily living.  Flowsheet Row PULMONARY REHAB OTHER RESPIRATORY from 03/04/2020 in [place]  Date 01/29/20  Educator mk  Instruction Review Code 2- Demonstrated Understanding      Bronchial Hygiene / Breathing Techniques: - Discuss breathing mechanics, pursed-lip breathing technique,  proper posture, effective ways to clear airways, and other functional breathing techniques Flowsheet Row PULMONARY REHAB OTHER RESPIRATORY from 03/04/2020 in [place]  Date 02/05/20  Educator Hand out  Instruction Review Code 1- Verbalizes Understanding      Cleaning Equipment: - Provides group verbal and written instruction about the health risks of elevated stress, cause of high stress, and healthy ways to reduce stress. Flowsheet Row PULMONARY REHAB OTHER RESPIRATORY from 03/04/2020 in [place]  Date 02/12/20  Educator Handout  Instruction Review Code 1- Verbalizes Understanding      Nutrition I: Fats: - Discuss the types of cholesterol, what cholesterol does to the body, and how cholesterol levels can be controlled. Flowsheet Row PULMONARY REHAB OTHER RESPIRATORY from 03/04/2020 in [place]  Date 02/19/20  Educator Handout  Instruction  Review Code 1- Verbalizes Understanding      Nutrition II: Labels: -Discuss the different components of food labels and how to read food labels. Flowsheet Row PULMONARY REHAB OTHER RESPIRATORY from 03/04/2020 in [place]  Date 02/26/20  Educator DJ  Instruction Review Code 1-  Verbalizes Understanding      Respiratory Infections: - Discuss the signs and symptoms of respiratory infections, ways to prevent respiratory infections, and the importance of seeking medical treatment when having a respiratory infection. Flowsheet Row PULMONARY REHAB OTHER RESPIRATORY from 03/04/2020 in [place]  Date 03/04/20  Educator Handout  Instruction Review Code 1- Verbalizes Understanding      Stress I: Signs and Symptoms: - Discuss the causes of stress, how stress may lead to anxiety and depression, and ways to limit stress.   Stress II: Relaxation: -Discuss relaxation techniques to limit stress.   Oxygen for Home/Travel: - Discuss how to prepare for travel when on oxygen and proper ways to transport and store oxygen to ensure safety.   Knowledge Questionnaire Score:  Knowledge Questionnaire Score - 01/09/20 1247      Knowledge Questionnaire Score   Pre Score 10/18           Core Components/Risk Factors/Patient Goals at Admission:  Personal Goals and Risk Factors at Admission - 01/09/20 1303      Core Components/Risk Factors/Patient Goals on Admission    Weight Management Yes;Weight Maintenance    Tobacco Cessation Yes    Number of packs per day takes him about a month to go through a pack currently    Intervention Assist the participant in steps to quit. Provide individualized education and counseling about committing to Tobacco Cessation, relapse prevention, and pharmacological support that can be provided by physician.;Advice worker, assist with locating and accessing local/national Quit Smoking programs, and support  quit date choice.   He is in a cessation program through the [place]   Expected Outcomes Short Term: Will demonstrate readiness to quit, by selecting a quit date.;Short Term: Will quit all tobacco product use, adhering to prevention of relapse plan.;Long Term: Complete abstinence from all tobacco products for at least 12 months from quit date.    Improve shortness of breath with ADL's Yes    Intervention Provide education, individualized exercise plan and daily activity instruction to help decrease symptoms of SOB with activities of daily living.    Expected Outcomes Short Term: Improve cardiorespiratory fitness to achieve a reduction of symptoms when performing ADLs;Long Term: Be able to perform more ADLs without symptoms or delay the onset of symptoms           Core Components/Risk Factors/Patient Goals Review:   Goals and Risk Factor Review    Row Name 01/14/20 0945 02/06/20 0801 03/04/20 1357         Core Components/Risk Factors/Patient Goals Review   Personal Goals Review Other;Tobacco Cessation Other;Tobacco Cessation Other;Tobacco Cessation  Continue to encouage cutting back on coffee.     Review Patient is new to the program completing 2 sessions. He was referred to the pulmonary rehab program due to dyspnea on exertion. His personal goals for the program are to get stronger and have more energy; have less SOB. We will continue to monitor as he works toward meeting his goals. Patient has completed 6 sessions gaining 2 lbs since his initial visit. He is doing well in the program with progressions and consistent attendance. He says he feels the program is helping him get stronger. His personal goals are to get stronger and have more energy and less SOB. We will continue to monitor his progress as he works toward meeting these goals. Patient has completed 14 sessions. Maintains weight around 171.5 #. He is doing well in the program with progressions and consistent attendance.  He says he feels the  program is helping him get stronger. His personal goals are to get stronger and have more energy and less SOB. We will continue to monitor his progress as he works toward meeting these goals.     Expected Outcomes Patient will complete the program meeting both program and personal goals. Patient will complete the program meeting both program and personal goals. Patient will complete the program meeting both program and personal goals.            Core Components/Risk Factors/Patient Goals at Discharge (Final Review):   Goals and Risk Factor Review - 03/04/20 1357      Core Components/Risk Factors/Patient Goals Review   Personal Goals Review Other;Tobacco Cessation   Continue to encouage cutting back on coffee.   Review Patient has completed 14 sessions. Maintains weight around 171.5 #. He is doing well in the program with progressions and consistent attendance. He says he feels the program is helping him get stronger. His personal goals are to get stronger and have more energy and less SOB. We will continue to monitor his progress as he works toward meeting these goals.    Expected Outcomes Patient will complete the program meeting both program and personal goals.           ITP Comments:   Comments: ITP REVIEW Pt is making expected progress toward pulmonary rehab goals after completing 16 sessions. Recommend continued exercise, life style modification, education, and utilization of breathing techniques to increase stamina and strength and decrease shortness of breath with exertion.

## 2020-03-11 NOTE — Progress Notes (Signed)
Daily Session Note  Patient Details  Name: Trevor Key MRN: [id number] Date of Birth: December 06, 1955 Referring Provider:   Flowsheet Row PULMONARY REHAB OTHER RESP ORIENTATION from 01/09/2020 in [place]  Referring Provider Bobbye Charleston, NP      Encounter Date: 03/11/2020  Check In:  Session Check In - 03/11/20 1044      Check-In   Supervising physician immediately available to respond to emergencies [facility] MD immediately available    Physician(s) Domenic Polite    Location [facility]    Staff Present Aundra Dubin, RN, BSN;Madison Audria Nine, MS, Exercise Physiologist;[place] Kris Mouton, MS, ACSM-CEP, Exercise Physiologist    Virtual Visit No    Medication changes reported     No    Fall or balance concerns reported    No    Tobacco Cessation No Change    Warm-up and Cool-down Performed as group-led instruction    Resistance Training Performed Yes    VAD Patient? No    PAD/SET Patient? No      Pain Assessment   Currently in Pain? No/denies    Multiple Pain Sites No           Capillary Blood Glucose: No results found for this or any previous visit (from the past 24 hour(s)).    Social History   Tobacco Use  Smoking Status Current Every Day Smoker  . Packs/day: 0.25  . Years: 40.00  . Pack years: 10.00  . Types: Cigarettes  Smokeless Tobacco Never Used  Tobacco Comment   He is currently enrolled in a smoking cessation program through the [place]. He reports that it is working.    Goals Met:  Proper associated with RPD/PD & O2 Sat Independence with exercise equipment Improved SOB with ADL's Using PLB without cueing & demonstrates good technique Exercise tolerated well No report of cardiac concerns or symptoms Strength training completed today  Goals Unmet:  Not Applicable  Comments: Check out 1145.   Dr. Kathie Dike is Medical Director for [facility] Pulmonary Rehab.

## 2020-03-16 ENCOUNTER — Other Ambulatory Visit: Payer: Self-pay

## 2020-03-16 ENCOUNTER — Encounter (HOSPITAL_COMMUNITY)
Admission: RE | Admit: 2020-03-16 | Discharge: 2020-03-16 | Disposition: A | Discharge status: Home / Self Care | Payer: No Typology Code available for payment source | Source: Ambulatory Visit | Attending: Registered Nurse | Admitting: Registered Nurse

## 2020-03-16 DIAGNOSIS — R0600 Dyspnea, unspecified: Secondary | ICD-10-CM | POA: Diagnosis not present

## 2020-03-16 NOTE — Progress Notes (Signed)
Daily Session Note  Patient Details  Name: CHRISTOPHER GLASSCOCK MRN: [id number] Date of Birth: 04/02/1955 Referring Provider:   Flowsheet Row PULMONARY REHAB OTHER RESP ORIENTATION from 01/09/2020 in [place]  Referring Provider Bobbye Charleston, NP      Encounter Date: 03/16/2020  Check In:  Session Check In - 03/16/20 1045      Check-In   Supervising physician immediately available to respond to emergencies [facility] MD immediately available    Physician(s) Dr. Harrington Challenger    Location [facility]    Staff Present Cathren Harsh, MS, Exercise Physiologist;Livian Vanderbeck Kris Mouton, MS, ACSM-CEP, Exercise Physiologist    Virtual Visit No    Medication changes reported     No    Fall or balance concerns reported    No    Tobacco Cessation No Change    Warm-up and Cool-down Performed as group-led instruction    Resistance Training Performed Yes    VAD Patient? No    PAD/SET Patient? No      Pain Assessment   Currently in Pain? No/denies    Multiple Pain Sites No           Capillary Blood Glucose: No results found for this or any previous visit (from the past 24 hour(s)).    Social History   Tobacco Use  Smoking Status Current Every Day Smoker  . Packs/day: 0.25  . Years: 40.00  . Pack years: 10.00  . Types: Cigarettes  Smokeless Tobacco Never Used  Tobacco Comment   He is currently enrolled in a smoking cessation program through the [place]. He reports that it is working.    Goals Met:  Independence with exercise equipment Exercise tolerated well No report of cardiac concerns or symptoms Strength training completed today  Goals Unmet:  Not Applicable  Comments: checkout time is 1145   Dr. Kathie Dike is Medical Director for [facility] Pulmonary Rehab.

## 2020-03-18 ENCOUNTER — Other Ambulatory Visit: Payer: Self-pay

## 2020-03-18 ENCOUNTER — Encounter (HOSPITAL_COMMUNITY)
Admission: RE | Admit: 2020-03-18 | Discharge: 2020-03-18 | Disposition: A | Discharge status: Home / Self Care | Payer: No Typology Code available for payment source | Source: Ambulatory Visit | Attending: Registered Nurse | Admitting: Registered Nurse

## 2020-03-18 DIAGNOSIS — R0600 Dyspnea, unspecified: Secondary | ICD-10-CM

## 2020-03-18 NOTE — Progress Notes (Signed)
Daily Session Note  Patient Details  Name: Trevor Key MRN: [id number] Date of Birth: Jun 06, 1955 Referring Provider:   Flowsheet Row PULMONARY REHAB OTHER RESP ORIENTATION from 01/09/2020 in [place]  Referring Provider Bobbye Charleston, NP      Encounter Date: 03/18/2020  Check In:  Session Check In - 03/18/20 1045      Check-In   Supervising physician immediately available to respond to emergencies [facility] MD immediately available    Physician(s) Domenic Polite    Location [facility]    Staff Present Aundra Dubin, RN, BSN;Madison Audria Nine, MS, Exercise Physiologist    Virtual Visit No    Medication changes reported     No    Fall or balance concerns reported    No    Tobacco Cessation No Change    Warm-up and Cool-down Performed as group-led instruction    Resistance Training Performed Yes    VAD Patient? No    PAD/SET Patient? No      Pain Assessment   Currently in Pain? No/denies    Multiple Pain Sites No           Capillary Blood Glucose: No results found for this or any previous visit (from the past 24 hour(s)).    Social History   Tobacco Use  Smoking Status Current Every Day Smoker  . Packs/day: 0.25  . Years: 40.00  . Pack years: 10.00  . Types: Cigarettes  Smokeless Tobacco Never Used  Tobacco Comment   He is currently enrolled in a smoking cessation program through the [place]. He reports that it is working.    Goals Met:  Proper associated with RPD/PD & O2 Sat Independence with exercise equipment Improved SOB with ADL's Using PLB without cueing & demonstrates good technique Exercise tolerated well No report of cardiac concerns or symptoms Strength training completed today  Goals Unmet:  Not Applicable  Comments: Check out 1145.   Dr. Kathie Dike is Medical Director for [facility] Pulmonary Rehab.

## 2020-03-23 ENCOUNTER — Other Ambulatory Visit: Payer: Self-pay

## 2020-03-23 ENCOUNTER — Encounter (HOSPITAL_COMMUNITY)
Admission: RE | Admit: 2020-03-23 | Discharge: 2020-03-23 | Disposition: A | Discharge status: Home / Self Care | Payer: No Typology Code available for payment source | Source: Ambulatory Visit | Attending: Registered Nurse | Admitting: Registered Nurse

## 2020-03-23 VITALS — Wt 171.1 lb

## 2020-03-23 DIAGNOSIS — R0600 Dyspnea, unspecified: Secondary | ICD-10-CM | POA: Diagnosis not present

## 2020-03-23 NOTE — Progress Notes (Signed)
Daily Session Note  Patient Details  Name: Trevor Key MRN: [id number] Date of Birth: [birth year] Referring Provider:   Flowsheet Row PULMONARY REHAB OTHER RESP ORIENTATION from 01/09/2020 in [place]  Referring Provider Bobbye Charleston, NP      Encounter Date: 03/23/2020  Check In:  Session Check In - 03/23/20 1045      Check-In   Supervising physician immediately available to respond to emergencies [facility] MD immediately available    Physician(s) Dr. Harl Bowie    Location [facility]    Staff Present Cathren Harsh, MS, Exercise Physiologist;Dalton Kris Mouton, MS, ACSM-CEP, Exercise Physiologist    Virtual Visit No    Medication changes reported     No    Fall or balance concerns reported    No    Tobacco Cessation No Change    Warm-up and Cool-down Performed as group-led instruction    Resistance Training Performed Yes    VAD Patient? No    PAD/SET Patient? No      Pain Assessment   Currently in Pain? No/denies    Multiple Pain Sites No           Capillary Blood Glucose: No results found for this or any previous visit (from the past 24 hour(s)).    Social History   Tobacco Use  Smoking Status Current Every Day Smoker  . Packs/day: 0.25  . Years: 40.00  . Pack years: 10.00  . Types: Cigarettes  Smokeless Tobacco Never Used  Tobacco Comment   He is currently enrolled in a smoking cessation program through the [place]. He reports that it is working.    Goals Met:  Independence with exercise equipment Exercise tolerated well No report of cardiac concerns or symptoms Strength training completed today  Goals Unmet:  Not Applicable  Comments: checkout time is 1145   Dr. Kathie Dike is Medical Director for [facility].

## 2020-03-25 ENCOUNTER — Other Ambulatory Visit: Payer: Self-pay

## 2020-03-25 ENCOUNTER — Encounter (HOSPITAL_COMMUNITY)
Admission: RE | Admit: 2020-03-25 | Discharge: 2020-03-25 | Disposition: A | Discharge status: Home / Self Care | Payer: No Typology Code available for payment source | Source: Ambulatory Visit | Attending: Registered Nurse | Admitting: Registered Nurse

## 2020-03-25 DIAGNOSIS — R0600 Dyspnea, unspecified: Secondary | ICD-10-CM | POA: Diagnosis not present

## 2020-03-25 NOTE — Progress Notes (Signed)
Daily Session Note  Patient Details  Name: Trevor Key MRN: [id number] Date of Birth: [birth year] Referring Provider:   Flowsheet Row PULMONARY REHAB OTHER RESP ORIENTATION from 01/09/2020 in [place]  Referring Provider Bobbye Charleston, NP      Encounter Date: 03/25/2020  Check In:  Session Check In - 03/25/20 1045      Check-In   Supervising physician immediately available to respond to emergencies [facility] MD immediately available    Physician(s) Dr. Harrington Challenger    Location [facility]    Staff Present Cathren Harsh, MS, Exercise Physiologist;Dalton Kris Mouton, MS, ACSM-CEP, Exercise Physiologist;Debra Wynetta Emery, RN, BSN    Virtual Visit No    Medication changes reported     No    Fall or balance concerns reported    No    Tobacco Cessation No Change    Warm-up and Cool-down Performed as group-led instruction    Resistance Training Performed Yes    VAD Patient? No    PAD/SET Patient? No      Pain Assessment   Currently in Pain? No/denies    Multiple Pain Sites No           Capillary Blood Glucose: No results found for this or any previous visit (from the past 24 hour(s)).    Social History   Tobacco Use  Smoking Status Current Every Day Smoker  . Packs/day: 0.25  . Years: 40.00  . Pack years: 10.00  . Types: Cigarettes  Smokeless Tobacco Never Used  Tobacco Comment   He is currently enrolled in a smoking cessation program through the [place]. He reports that it is working.    Goals Met:  Independence with exercise equipment Exercise tolerated well No report of cardiac concerns or symptoms Strength training completed today  Goals Unmet:  Not Applicable  Comments: checkout time is 1145   Dr. Kathie Dike is Medical Director for [facility].

## 2020-03-30 ENCOUNTER — Encounter (HOSPITAL_COMMUNITY): Payer: No Typology Code available for payment source

## 2020-04-01 ENCOUNTER — Encounter (HOSPITAL_COMMUNITY): Payer: No Typology Code available for payment source

## 2020-04-02 NOTE — Progress Notes (Signed)
Discharge Progress Report  Patient Details  Name: Trevor Key MRN: [id number] Date of Birth: [birth year] Referring Provider:   Flowsheet Row PULMONARY REHAB OTHER RESP ORIENTATION from 01/09/2020 in [place]  Referring Provider Bobbye Charleston, NP       Number of Visits: 21  Reason for Discharge:  Early Exit:  Insurance  Smoking History:  Social History   Tobacco Use  Smoking Status Current Every Day Smoker  . Packs/day: 0.25  . Years: 40.00  . Pack years: 10.00  . Types: Cigarettes  Smokeless Tobacco Never Used  Tobacco Comment   He is currently enrolled in a smoking cessation program through the [place]. He reports that it is working.    Diagnosis:  Dyspnea, unspecified type  ADL UCSD:  Pulmonary Assessment Scores    Row Name 01/09/20 1248         ADL UCSD   SOB Score total 48           CAT Score   CAT Score 22           mMRC Score   mMRC Score 3            Initial Exercise Prescription:  Initial Exercise Prescription - 01/09/20 1400      Date of Initial Exercise RX and Referring Provider   Date 01/09/20    Referring Provider Bobbye Charleston, NP    Expected Discharge Date 05/13/20      Treadmill   MPH 1.6    Grade 0    Minutes 17      Recumbant Elliptical   Level 1    RPM 60    Minutes 22      Prescription Details   Frequency (times per week) 2    Duration Progress to 30 minutes of continuous aerobic without signs/symptoms of physical distress      Intensity   THRR 40-80% of Max Heartrate 62-125    Ratings of Perceived Exertion 11-13    Perceived Dyspnea 0-4      Progression   Progression Continue progressive overload as per policy without signs/symptoms or physical distress.      Resistance Training   Training Prescription Yes    Weight 3    Reps 10-15           Discharge Exercise Prescription (Final Exercise Prescription Changes):  Exercise Prescription Changes - 03/23/20 1500      Response to  Exercise   Blood Pressure (Admit) 140/84    Blood Pressure (Exercise) 158/90    Blood Pressure (Exit) 140/90    Heart Rate (Admit) 61 bpm    Heart Rate (Exercise) 90 bpm    Heart Rate (Exit) 65 bpm    Oxygen Saturation (Admit) 98 %    Oxygen Saturation (Exercise) 97 %    Oxygen Saturation (Exit) 97 %    Rating of Perceived Exertion (Exercise) 10    Perceived Dyspnea (Exercise) 10    Duration Continue with 30 min of aerobic exercise without signs/symptoms of physical distress.    Intensity THRR unchanged      Progression   Progression Continue to progress workloads to maintain intensity without signs/symptoms of physical distress.      Resistance Training   Training Prescription Yes    Weight 5lbs    Reps 10-15    Time 10 Minutes      Treadmill   MPH 1.9    Grade 2    Minutes 17    METs  2.97      Recumbant Elliptical   Level 2    RPM 53    Minutes 22    METs 3.2           Functional Capacity:  6 Minute Walk    Row Name 01/09/20 1419         6 Minute Walk   Phase Initial     Distance 1400 feet     Walk Time 6 minutes     # of Rest Breaks 0     MPH 2.7     METS 3.39     RPE 9     Perceived Dyspnea  15     VO2 Peak 11.88     Symptoms Yes (comment)     Comments Complained of Shortness of Breath     Resting HR 59 bpm     Resting BP 144/80     Resting Oxygen Saturation  98 %     Exercise Oxygen Saturation  during 6 min walk 95 %     Max Ex. HR 102 bpm     Max Ex. BP 162/82     2 Minute Post BP 158/78           Interval HR   1 Minute HR 59     2 Minute HR 92     3 Minute HR 94     4 Minute HR 93     5 Minute HR 96     6 Minute HR 102     2 Minute Post HR 98     Interval Heart Rate? Yes           Interval Oxygen   Interval Oxygen? Yes     Baseline Oxygen Saturation % 98 %     1 Minute Oxygen Saturation % 95 %     1 Minute Liters of Oxygen 0 L     2 Minute Oxygen Saturation % 97 %     2 Minute Liters of Oxygen 0 L     3 Minute Oxygen  Saturation % 97 %     3 Minute Liters of Oxygen 0 L     4 Minute Oxygen Saturation % 97 %     4 Minute Liters of Oxygen 0 L     5 Minute Oxygen Saturation % 97 %     5 Minute Liters of Oxygen 0 L     6 Minute Oxygen Saturation % 96 %     6 Minute Liters of Oxygen 0 L     2 Minute Post Oxygen Saturation % 98 %     2 Minute Post Liters of Oxygen 0 L            Psychological, QOL, Others - Outcomes: PHQ 2/9: Depression screen [facility] 2/9 01/09/2020 02/07/2011  Decreased Interest 0 0  Down, Depressed, Hopeless 0 0  PHQ - 2 Score 0 0  Altered sleeping 1 -  Tired, decreased energy 1 -  Change in appetite 0 -  Feeling bad or failure about yourself  0 -  Trouble concentrating 0 -  Moving slowly or fidgety/restless 0 -  Suicidal thoughts 0 -  PHQ-9 Score 2 -  Difficult doing work/chores Not difficult at all -    Quality of Life:  Quality of Life - 01/09/20 1432      Quality of Life   Select Quality of Life      Quality of Life Scores  Health/Function Pre 27.54 %    Socioeconomic Pre 27.75 %    Psych/Spiritual Pre 30 %    Family Pre 30 %    GLOBAL Pre 28.52 %           Personal Goals: Goals established at orientation with interventions provided to work toward goal.  Personal Goals and Risk Factors at Admission - 01/09/20 1303      Core Components/Risk Factors/Patient Goals on Admission    Weight Management Yes;Weight Maintenance    Tobacco Cessation Yes    Number of packs per day takes him about a month to go through a pack currently    Intervention Assist the participant in steps to quit. Provide individualized education and counseling about committing to Tobacco Cessation, relapse prevention, and pharmacological support that can be provided by physician.;Advice worker, assist with locating and accessing local/national Quit Smoking programs, and support quit date choice.   He is in a cessation program through the [place]   Expected Outcomes Short Term: Will  demonstrate readiness to quit, by selecting a quit date.;Short Term: Will quit all tobacco product use, adhering to prevention of relapse plan.;Long Term: Complete abstinence from all tobacco products for at least 12 months from quit date.    Improve shortness of breath with ADL's Yes    Intervention Provide education, individualized exercise plan and daily activity instruction to help decrease symptoms of SOB with activities of daily living.    Expected Outcomes Short Term: Improve cardiorespiratory fitness to achieve a reduction of symptoms when performing ADLs;Long Term: Be able to perform more ADLs without symptoms or delay the onset of symptoms            Personal Goals Discharge:  Goals and Risk Factor Review    Row Name 01/14/20 0945 02/06/20 0801 03/04/20 1357 04/02/20 1414       Core Components/Risk Factors/Patient Goals Review   Personal Goals Review Other;Tobacco Cessation Other;Tobacco Cessation Other;Tobacco Cessation  Continue to encouage cutting back on coffee. Other;Tobacco Cessation    Review Patient is new to the program completing 2 sessions. He was referred to the pulmonary rehab program due to dyspnea on exertion. His personal goals for the program are to get stronger and have more energy; have less SOB. We will continue to monitor as he works toward meeting his goals. Patient has completed 6 sessions gaining 2 lbs since his initial visit. He is doing well in the program with progressions and consistent attendance. He says he feels the program is helping him get stronger. His personal goals are to get stronger and have more energy and less SOB. We will continue to monitor his progress as he works toward meeting these goals. Patient has completed 14 sessions. Maintains weight around 171.5 #. He is doing well in the program with progressions and consistent attendance. He says he feels the program is helping him get stronger. His personal goals are to get stronger and have more  energy and less SOB. We will continue to monitor his progress as he works toward meeting these goals. patient had completed 21 sessions before discharge. He gained about 2 lbs since he entered the program. He felt the program was helping him get stronger and get around easier. His goals were to get stronger and to decrease shortness of breath. He feels he has met those goals and will continue to work on them after rehab.    Expected Outcomes Patient will complete the program meeting both program and personal goals.  Patient will complete the program meeting both program and personal goals. Patient will complete the program meeting both program and personal goals. --           Exercise Goals and Review:  Exercise Goals    Row Name 01/09/20 1423 02/10/20 1215 03/09/20 1203         Exercise Goals   Increase Physical Activity Yes Yes Yes     Intervention Provide advice, education, support and counseling about physical activity/exercise needs.;Develop an individualized exercise prescription for aerobic and resistive training based on initial evaluation findings, risk stratification, comorbidities and participant's personal goals. Provide advice, education, support and counseling about physical activity/exercise needs.;Develop an individualized exercise prescription for aerobic and resistive training based on initial evaluation findings, risk stratification, comorbidities and participant's personal goals. Provide advice, education, support and counseling about physical activity/exercise needs.;Develop an individualized exercise prescription for aerobic and resistive training based on initial evaluation findings, risk stratification, comorbidities and participant's personal goals.     Expected Outcomes Short Term: Attend rehab on a regular basis to increase amount of physical activity.;Long Term: Add in home exercise to make exercise part of routine and to increase amount of physical activity.;Long Term:  Exercising regularly at least 3-5 days a week. Short Term: Attend rehab on a regular basis to increase amount of physical activity.;Long Term: Add in home exercise to make exercise part of routine and to increase amount of physical activity.;Long Term: Exercising regularly at least 3-5 days a week. Short Term: Attend rehab on a regular basis to increase amount of physical activity.;Long Term: Add in home exercise to make exercise part of routine and to increase amount of physical activity.;Long Term: Exercising regularly at least 3-5 days a week.     Increase Strength and Stamina Yes Yes Yes     Intervention Provide advice, education, support and counseling about physical activity/exercise needs.;Develop an individualized exercise prescription for aerobic and resistive training based on initial evaluation findings, risk stratification, comorbidities and participant's personal goals. Provide advice, education, support and counseling about physical activity/exercise needs.;Develop an individualized exercise prescription for aerobic and resistive training based on initial evaluation findings, risk stratification, comorbidities and participant's personal goals. Provide advice, education, support and counseling about physical activity/exercise needs.;Develop an individualized exercise prescription for aerobic and resistive training based on initial evaluation findings, risk stratification, comorbidities and participant's personal goals.     Expected Outcomes Short Term: Increase workloads from initial exercise prescription for resistance, speed, and METs.;Short Term: Perform resistance training exercises routinely during rehab and add in resistance training at home;Long Term: Improve cardiorespiratory fitness, muscular endurance and strength as measured by increased METs and functional capacity (6MWT) Short Term: Increase workloads from initial exercise prescription for resistance, speed, and METs.;Short Term: Perform  resistance training exercises routinely during rehab and add in resistance training at home;Long Term: Improve cardiorespiratory fitness, muscular endurance and strength as measured by increased METs and functional capacity (6MWT) Short Term: Increase workloads from initial exercise prescription for resistance, speed, and METs.;Short Term: Perform resistance training exercises routinely during rehab and add in resistance training at home;Long Term: Improve cardiorespiratory fitness, muscular endurance and strength as measured by increased METs and functional capacity (6MWT)     Able to understand and use rate of perceived exertion (RPE) scale Yes Yes Yes     Intervention Provide education and explanation on how to use RPE scale Provide education and explanation on how to use RPE scale Provide education and explanation on how to use RPE  scale     Expected Outcomes Short Term: Able to use RPE daily in rehab to express subjective intensity level;Long Term:  Able to use RPE to guide intensity level when exercising independently Short Term: Able to use RPE daily in rehab to express subjective intensity level;Long Term:  Able to use RPE to guide intensity level when exercising independently Short Term: Able to use RPE daily in rehab to express subjective intensity level;Long Term:  Able to use RPE to guide intensity level when exercising independently     Able to understand and use Dyspnea scale Yes Yes Yes     Intervention Provide education and explanation on how to use Dyspnea scale Provide education and explanation on how to use Dyspnea scale Provide education and explanation on how to use Dyspnea scale     Expected Outcomes Short Term: Able to use Dyspnea scale daily in rehab to express subjective sense of shortness of breath during exertion;Long Term: Able to use Dyspnea scale to guide intensity level when exercising independently Short Term: Able to use Dyspnea scale daily in rehab to express subjective sense  of shortness of breath during exertion;Long Term: Able to use Dyspnea scale to guide intensity level when exercising independently Short Term: Able to use Dyspnea scale daily in rehab to express subjective sense of shortness of breath during exertion;Long Term: Able to use Dyspnea scale to guide intensity level when exercising independently     Knowledge and understanding of Target Heart Rate Range (THRR) Yes Yes Yes     Intervention Provide education and explanation of THRR including how the numbers were predicted and where they are located for reference Provide education and explanation of THRR including how the numbers were predicted and where they are located for reference Provide education and explanation of THRR including how the numbers were predicted and where they are located for reference     Expected Outcomes Short Term: Able to state/look up THRR;Short Term: Able to use daily as guideline for intensity in rehab;Long Term: Able to use THRR to govern intensity when exercising independently Short Term: Able to state/look up THRR;Short Term: Able to use daily as guideline for intensity in rehab;Long Term: Able to use THRR to govern intensity when exercising independently Short Term: Able to state/look up THRR;Short Term: Able to use daily as guideline for intensity in rehab;Long Term: Able to use THRR to govern intensity when exercising independently     Able to check pulse independently Yes Yes Yes     Intervention Provide education and demonstration on how to check pulse in carotid and radial arteries.;Review the importance of being able to check your own pulse for safety during independent exercise Provide education and demonstration on how to check pulse in carotid and radial arteries.;Review the importance of being able to check your own pulse for safety during independent exercise Provide education and demonstration on how to check pulse in carotid and radial arteries.;Review the importance of  being able to check your own pulse for safety during independent exercise     Expected Outcomes Short Term: Able to explain why pulse checking is important during independent exercise;Long Term: Able to check pulse independently and accurately Short Term: Able to explain why pulse checking is important during independent exercise;Long Term: Able to check pulse independently and accurately Short Term: Able to explain why pulse checking is important during independent exercise;Long Term: Able to check pulse independently and accurately     Understanding of Exercise Prescription Yes Yes Yes  Intervention Provide education, explanation, and written materials on patient's individual exercise prescription Provide education, explanation, and written materials on patient's individual exercise prescription Provide education, explanation, and written materials on patient's individual exercise prescription     Expected Outcomes Short Term: Able to explain program exercise prescription;Long Term: Able to explain home exercise prescription to exercise independently Short Term: Able to explain program exercise prescription;Long Term: Able to explain home exercise prescription to exercise independently Short Term: Able to explain program exercise prescription;Long Term: Able to explain home exercise prescription to exercise independently            Exercise Goals Re-Evaluation:  Exercise Goals Re-Evaluation    [place] Name 01/13/20 1152 02/10/20 1215 03/09/20 1204 04/02/20 1041       Exercise Goal Re-Evaluation   Exercise Goals Review Increase Physical Activity;Increase Strength and Stamina;Able to understand and use rate of perceived exertion (RPE) scale;Able to understand and use Dyspnea scale;Knowledge and understanding of Target Heart Rate Range (THRR);Able to check pulse independently;Understanding of Exercise Prescription Increase Physical Activity;Increase Strength and Stamina;Able to understand and use rate  of perceived exertion (RPE) scale;Able to understand and use Dyspnea scale;Knowledge and understanding of Target Heart Rate Range (THRR);Able to check pulse independently;Understanding of Exercise Prescription Increase Physical Activity;Increase Strength and Stamina;Able to understand and use rate of perceived exertion (RPE) scale;Able to understand and use Dyspnea scale;Knowledge and understanding of Target Heart Rate Range (THRR);Able to check pulse independently;Understanding of Exercise Prescription Increase Physical Activity;Increase Strength and Stamina;Able to understand and use rate of perceived exertion (RPE) scale;Able to understand and use Dyspnea scale;Knowledge and understanding of Target Heart Rate Range (THRR);Able to check pulse independently;Understanding of Exercise Prescription    Comments Patient has completed 1 exercise session. He has been through the program before so it was a very easy transition for him. He tolerated exercise well. He is very positive and engaging when he comes to rehab. He is exercising at 1.5 METs on the elliptical. Will continue to progress exercise as able. Patient has completed 7 exercise sessions. He is progressing well. He has gotten stronger and has been able to increase his intensities in resistance training and in aerobic training. He is always very positive to come to rehab and engages with the staff andother patients. He is currently exercising at 2.5 METs on the elliptical. Will continue to progress as able. Patient has completed 15 exercise sessions. He is progressing well and says that he feels the program is helping him with ADLs. He is always ver positive when he comes to rehab. He is exercising at home by walking. He is currently exercising at 3 METs on the elliptical. Will continue to progress as able. Patient completed 21 sessions before discharge. He progressed well throughout the program and he feels to program helped him with normal activities. He  felt he got stronger throughout the program as well. He was great to work with and always had a positive attitude.    Expected Outcomes Through exercise at rehab and with a home exercise program, patient will reach their goals. Through exercise at rehab and with a home exercise program, patient will reach their goals. Through exercise at rehab and with a home exercise program, patient will reach their goals. --           Nutrition & Weight - Outcomes:  Pre Biometrics - 03/23/20 1526      Pre Biometrics   Weight 77.6 kg  Nutrition:  Nutrition Therapy & Goals - 03/04/20 1355      Personal Nutrition Goals   Comments We will continue to provide nutritional education through handouts.      Intervention Plan   Intervention Nutrition handout(s) given to patient.           Nutrition Discharge:  Nutrition Assessments - 01/09/20 1311      MEDFICTS Scores   Pre Score 63           Education Questionnaire Score:  Knowledge Questionnaire Score - 01/09/20 1247      Knowledge Questionnaire Score   Pre Score 10/18          Patient was discharged early because of his [facility] time frame has expired.

## 2022-07-17 ENCOUNTER — Telehealth (HOSPITAL_COMMUNITY): Payer: Self-pay | Admitting: *Deleted

## 2022-07-17 NOTE — Telephone Encounter (Signed)
Cardiac Rehab Medication Review by a Nurse   Does the patient  feel that his/her medications are working for him/her?  yes   Has the patient been experiencing any side effects to the medications prescribed?  yes plans to discuss with his doctor on August 12 appointment.   Does the patient measure his/her own blood pressure or blood glucose at home?  yes    Does the patient have any problems obtaining medications due to transportation or finances?   no   Understanding of regimen: excellent Understanding of indications: excellent Potential of compliance: excellent     Verified medication via phone with patient. Nurse comments:

## 2022-07-18 ENCOUNTER — Encounter (HOSPITAL_COMMUNITY)
Admission: RE | Admit: 2022-07-18 | Discharge: 2022-07-18 | Disposition: A | Discharge status: Home / Self Care | Payer: No Typology Code available for payment source | Source: Ambulatory Visit | Attending: Registered Nurse | Admitting: Registered Nurse

## 2022-07-18 VITALS — Ht 65.0 in | Wt 172.1 lb

## 2022-07-18 DIAGNOSIS — J449 Chronic obstructive pulmonary disease, unspecified: Secondary | ICD-10-CM | POA: Insufficient documentation

## 2022-07-18 DIAGNOSIS — R0609 Other forms of dyspnea: Secondary | ICD-10-CM | POA: Insufficient documentation

## 2022-07-18 NOTE — Progress Notes (Signed)
Pulmonary Individual Treatment Plan  Patient Details  Name: Trevor Key MRN: [id number] Date of Birth: [birth year] Referring Provider:   Flowsheet Row PULMONARY REHAB COPD ORIENTATION from 07/18/2022 in [facility] CARDIAC REHABILITATION  Referring Provider Vito Berger, NP  [Dr. Pernell Dupre (attending MD)]       Initial Encounter Date:  Flowsheet Row PULMONARY REHAB COPD ORIENTATION from 07/18/2022 in [facility] CARDIAC REH[facility]ATION  Date 07/18/22       Visit Diagnosis: Dyspnea on exertion  Chronic obstructive pulmonary disease, unspecified COPD type (HCC)  Patient's Home Medications on Admission:   Current Outpatient Medications:    albuterol (PROVENTIL HFA;VENTOLIN HFA) 108 (90 BASE) MCG/ACT inhaler, Inhale 1-2 puffs into the lungs every 6 (six) hours as needed for wheezing., Disp: 1 Inhaler, Rfl: 0   aspirin 81 MG chewable tablet, Chew 81 mg by mouth daily., Disp: , Rfl:    atorvastatin (LIPITOR) 80 MG tablet, Take 80 mg by mouth daily., Disp: , Rfl:    cholecalciferol (VITAMIN D3) 25 MCG (1000 UNIT) tablet, Take 1,000 Units by mouth daily., Disp: , Rfl:    losartan (COZAAR) 100 MG tablet, Take 100 mg by mouth daily., Disp: , Rfl:    metoprolol succinate (TOPROL-XL) 50 MG 24 hr tablet, Take 50 mg by mouth daily. Take with or immediately following a meal., Disp: , Rfl:    nitroGLYCERIN (NITROLINGUAL) 0.4 MG/SPRAY spray, Place 1 spray under the tongue every 5 (five) minutes as needed. For chest pain., Disp: , Rfl:    omeprazole (PRILOSEC) 20 MG capsule, Take 20 mg by mouth daily., Disp: , Rfl:    primidone (MYSOLINE) 50 MG tablet, Take 50 mg by mouth daily., Disp: , Rfl:    tiotropium (SPIRIVA) 18 MCG inhalation capsule, Place 18 mcg into inhaler and inhale daily., Disp: , Rfl:   Past Medical History: Past Medical History:  Diagnosis Date   Coronary atherosclerosis of native coronary artery    BMS LAD 1/13, LVEF 55-60%   Hypercholesteremia    Hypertension    MI  (myocardial infarction) (HCC)    AMI 1/13    Tobacco Use: Social History   Tobacco Use  Smoking Status Every Day   Current packs/day: 0.25   Average packs/day: 0.3 packs/day for 40.0 years (10.0 ttl pk-yrs)   Types: Cigarettes  Smokeless Tobacco Never  Tobacco Comments   He is currently enrolled in a smoking cessation program through the [place]. He reports that it is working.    Labs: Review Flowsheet       Latest Ref Rng & Units 03/27/2011 10/28/2011 05/19/2016 05/20/2016  Labs for ITP Cardiac and Pulmonary Rehab  Cholestrol 0 - 200 mg/dL 409  - - 811   LDL (calc) 0 - 99 mg/dL 66  - - 71   HDL-C >91 mg/dL 33  - - 33   Trlycerides <150 mg/dL 96  - - 93   Hemoglobin A1c 4.8 - 5.6 % - - - 5.9   TCO2 0 - 100 mmol/L - 26  28  -    Details            Capillary Blood Glucose: No results found for: "GLUCAP"   Pulmonary Assessment Scores:  Pulmonary Assessment Scores     Row Name 07/18/22 1123         ADL UCSD   ADL Phase Entry     SOB Score total 87     Rest 0     Walk 1  Stairs 5     Bath 3     Dress 3     Shop 5       CAT Score   CAT Score 22       mMRC Score   mMRC Score 1             UCSD: Self-administered rating of dyspnea associated with activities of daily living (ADLs) 6-point scale (0 = "not at all" to 5 = "maximal or unable to do because of breathlessness")  Scoring Scores range from 0 to 120.  Minimally important difference is 5 units  CAT: CAT can identify the health impairment of COPD patients and is better correlated with disease progression.  CAT has a scoring range of zero to 40. The CAT score is classified into four groups of low (less than 10), medium (10 - 20), high (21-30) and very high (31-40) based on the impact level of disease on health status. A CAT score over 10 suggests significant symptoms.  A worsening CAT score could be explained by an exacerbation, poor medication adherence, poor inhaler technique, or progression of COPD  or comorbid conditions.  CAT MCID is 2 points  mMRC: mMRC (Modified Medical Research Council) Dyspnea Scale is used to assess the degree of baseline functional disability in patients of respiratory disease due to dyspnea. No minimal important difference is established. A decrease in score of 1 point or greater is considered a positive change.   Pulmonary Function Assessment:  Pulmonary Function Assessment - 07/18/22 1126       Breath   Shortness of Breath Yes;Limiting activity;Fear of Shortness of Breath             Exercise Target Goals: Exercise Program Goal: Individual exercise prescription set using results from initial 6 min walk test and THRR while considering  patient's activity barriers and safety.   Exercise Prescription Goal: Initial exercise prescription builds to 30-45 minutes a day of aerobic activity, 2-3 days per week.  Home exercise guidelines will be given to patient during program as part of exercise prescription that the participant will acknowledge.  Activity Barriers & Risk Stratification:  Activity Barriers & Cardiac Risk Stratification - 07/18/22 0756       Activity Barriers & Cardiac Risk Stratification   Activity Barriers Arthritis;Joint Problems;History of Falls   R shoulder bothersome            6 Minute Walk:  6 Minute Walk     Row Name 07/18/22 0944         6 Minute Walk   Phase Initial     Distance 1374 feet     Walk Time 6 minutes     # of Rest Breaks 0     MPH 2.6     METS 3.28     RPE 12     Perceived Dyspnea  3     VO2 Peak 11.49     Symptoms Yes (comment)     Comments SOB, chest pain 3/10 at end     Resting HR 61 bpm     Resting BP 146/74     Resting Oxygen Saturation  97 %     Exercise Oxygen Saturation  during 6 min walk 95 %     Max Ex. HR 89 bpm     Max Ex. BP 176/64     2 Minute Post BP 132/64       Interval HR   1 Minute HR 65  2 Minute HR --  error     3 Minute HR --  error     4 Minute HR --  error      5 Minute HR --  error     6 Minute HR 89     2 Minute Post HR 69     Interval Heart Rate? Yes       Interval Oxygen   Interval Oxygen? Yes     Baseline Oxygen Saturation % 97 %     1 Minute Oxygen Saturation % 96 %     1 Minute Liters of Oxygen 0 L  Room Air     2 Minute Oxygen Saturation % 95 %     2 Minute Liters of Oxygen 0 L     3 Minute Oxygen Saturation % 95 %     3 Minute Liters of Oxygen 0 L     4 Minute Oxygen Saturation % 95 %     4 Minute Liters of Oxygen 0 L     5 Minute Oxygen Saturation % 95 %     5 Minute Liters of Oxygen 0 L     6 Minute Oxygen Saturation % 95 %     6 Minute Liters of Oxygen 0 L     2 Minute Post Oxygen Saturation % 96 %     2 Minute Post Liters of Oxygen 0 L              Oxygen Initial Assessment:  Oxygen Initial Assessment - 07/18/22 1121       Home Oxygen   Home Oxygen Device None    Sleep Oxygen Prescription CPAP    Liters per minute 0    Home Exercise Oxygen Prescription None    Home Resting Oxygen Prescription None    Compliance with Home Oxygen Use No   does not put back on after getting up to go to bathroom     Initial 6 min Walk   Oxygen Used None      Program Oxygen Prescription   Program Oxygen Prescription None      Intervention   Short Term Goals To learn and exhibit compliance with exercise, home and travel O2 prescription;To learn and understand importance of monitoring SPO2 with pulse oximeter and demonstrate accurate use of the pulse oximeter.;To learn and understand importance of maintaining oxygen saturations>88%;To learn and demonstrate proper pursed lip breathing techniques or other breathing techniques. ;To learn and demonstrate proper use of respiratory medications    Long  Term Goals Exhibits compliance with exercise, home  and travel O2 prescription;Verbalizes importance of monitoring SPO2 with pulse oximeter and return demonstration;Maintenance of O2 saturations>88%;Exhibits proper breathing techniques,  such as pursed lip breathing or other method taught during program session;Compliance with respiratory medication;Demonstrates proper use of MDI's             Oxygen Re-Evaluation:  Oxygen Re-Evaluation     Row Name 07/18/22 1122             Goals/Expected Outcomes   Short Term Goals To learn and demonstrate proper pursed lip breathing techniques or other breathing techniques. ;To learn and understand importance of maintaining oxygen saturations>88%;To learn and understand importance of monitoring SPO2 with pulse oximeter and demonstrate accurate use of the pulse oximeter.       Long  Term Goals Exhibits proper breathing techniques, such as pursed lip breathing or other method taught during program session;Maintenance of O2 saturations>88%;Verbalizes importance of monitoring  SPO2 with pulse oximeter and return demonstration       Comments Reviewed PLB technique with pt.  Talked about how it works and it's importance in maintaining their exercise saturations.       Goals/Expected Outcomes Short: Become more profiecient at using PLB.   Long: Become independent at using PLB.                Oxygen Discharge (Final Oxygen Re-Evaluation):  Oxygen Re-Evaluation - 07/18/22 1122       Goals/Expected Outcomes   Short Term Goals To learn and demonstrate proper pursed lip breathing techniques or other breathing techniques. ;To learn and understand importance of maintaining oxygen saturations>88%;To learn and understand importance of monitoring SPO2 with pulse oximeter and demonstrate accurate use of the pulse oximeter.    Long  Term Goals Exhibits proper breathing techniques, such as pursed lip breathing or other method taught during program session;Maintenance of O2 saturations>88%;Verbalizes importance of monitoring SPO2 with pulse oximeter and return demonstration    Comments Reviewed PLB technique with pt.  Talked about how it works and it's importance in maintaining their exercise  saturations.    Goals/Expected Outcomes Short: Become more profiecient at using PLB.   Long: Become independent at using PLB.             Initial Exercise Prescription:  Initial Exercise Prescription - 07/18/22 0900       Date of Initial Exercise RX and Referring Provider   Date 07/18/22    Referring Provider Vito Berger, NP   Dr. Pernell Dupre (attending MD)     Oxygen   Maintain Oxygen Saturation 88% or higher      Treadmill   MPH 2.5    Grade 0.5    Minutes 15    METs 3.09      REL-XR   Level 3    Speed 50    Minutes 15    METs 3.2      Prescription Details   Frequency (times per week) 2    Duration Progress to 30 minutes of continuous aerobic without signs/symptoms of physical distress      Intensity   THRR 40-80% of Max Heartrate 98-135    Ratings of Perceived Exertion 11-13    Perceived Dyspnea 0-4      Progression   Progression Continue to progress workloads to maintain intensity without signs/symptoms of physical distress.      Resistance Training   Training Prescription Yes    Weight 5 lb    Reps 10-15             Perform Capillary Blood Glucose checks as needed.  Exercise Prescription Changes:   Exercise Prescription Changes     Row Name 07/18/22 0900             Response to Exercise   Blood Pressure (Admit) 146/74       Blood Pressure (Exercise) 176/64       Blood Pressure (Exit) 132/64       Heart Rate (Admit) 61 bpm       Heart Rate (Exercise) 89 bpm       Heart Rate (Exit) 72 bpm       Oxygen Saturation (Admit) 97 %       Oxygen Saturation (Exercise) 95 %       Oxygen Saturation (Exit) 97 %       Rating of Perceived Exertion (Exercise) 12       Perceived Dyspnea (Exercise) 3  Symptoms SOB, chest pain (3/10)       Comments walk test results                Exercise Comments:   Exercise Goals and Review:   Exercise Goals     Row Name 07/18/22 0955             Exercise Goals   Increase Physical  Activity Yes       Intervention Provide advice, education, support and counseling about physical activity/exercise needs.;Develop an individualized exercise prescription for aerobic and resistive training based on initial evaluation findings, risk stratification, comorbidities and participant's personal goals.       Expected Outcomes Short Term: Attend rehab on a regular basis to increase amount of physical activity.;Long Term: Add in home exercise to make exercise part of routine and to increase amount of physical activity.;Long Term: Exercising regularly at least 3-5 days a week.       Increase Strength and Stamina Yes       Intervention Provide advice, education, support and counseling about physical activity/exercise needs.;Develop an individualized exercise prescription for aerobic and resistive training based on initial evaluation findings, risk stratification, comorbidities and participant's personal goals.       Expected Outcomes Short Term: Increase workloads from initial exercise prescription for resistance, speed, and METs.;Short Term: Perform resistance training exercises routinely during rehab and add in resistance training at home;Long Term: Improve cardiorespiratory fitness, muscular endurance and strength as measured by increased METs and functional capacity ( )       Able to understand and use rate of perceived exertion (RPE) scale Yes       Intervention Provide education and explanation on how to use RPE scale       Expected Outcomes Short Term: Able to use RPE daily in rehab to express subjective intensity level;Long Term:  Able to use RPE to guide intensity level when exercising independently       Able to understand and use Dyspnea scale Yes       Intervention Provide education and explanation on how to use Dyspnea scale       Expected Outcomes Short Term: Able to use Dyspnea scale daily in rehab to express subjective sense of shortness of breath during exertion;Long Term: Able to  use Dyspnea scale to guide intensity level when exercising independently       Knowledge and understanding of Target Heart Rate Range (THRR) Yes       Intervention Provide education and explanation of THRR including how the numbers were predicted and where they are located for reference       Expected Outcomes Short Term: Able to state/look up THRR;Short Term: Able to use daily as guideline for intensity in rehab;Long Term: Able to use THRR to govern intensity when exercising independently       Able to check pulse independently Yes       Intervention Provide education and demonstration on how to check pulse in carotid and radial arteries.;Review the importance of being able to check your own pulse for safety during independent exercise       Expected Outcomes Short Term: Able to explain why pulse checking is important during independent exercise;Long Term: Able to check pulse independently and accurately       Understanding of Exercise Prescription Yes       Intervention Provide education, explanation, and written materials on patient's individual exercise prescription       Expected Outcomes Short Term: Able to  explain program exercise prescription;Long Term: Able to explain home exercise prescription to exercise independently                Exercise Goals Re-Evaluation :  Exercise Goals Re-Evaluation     Row Name 07/18/22 1033             Exercise Goal Re-Evaluation   Exercise Goals Review Able to understand and use rate of perceived exertion (RPE) scale;Able to understand and use Dyspnea scale;Understanding of Exercise Prescription       Comments Reviewed RPE  and dyspnea scale and program prescription with pt today.  Pt voiced understanding and was given a copy of goals to take home.       Expected Outcomes Short: Use RPE daily to regulate intensity.  Long: Follow program prescription                Discharge Exercise Prescription (Final Exercise Prescription Changes):   Exercise Prescription Changes - 07/18/22 0900       Response to Exercise   Blood Pressure (Admit) 146/74    Blood Pressure (Exercise) 176/64    Blood Pressure (Exit) 132/64    Heart Rate (Admit) 61 bpm    Heart Rate (Exercise) 89 bpm    Heart Rate (Exit) 72 bpm    Oxygen Saturation (Admit) 97 %    Oxygen Saturation (Exercise) 95 %    Oxygen Saturation (Exit) 97 %    Rating of Perceived Exertion (Exercise) 12    Perceived Dyspnea (Exercise) 3    Symptoms SOB, chest pain (3/10)    Comments walk test results             Nutrition:  Target Goals: Understanding of nutrition guidelines, daily intake of sodium 1500mg , cholesterol 200mg , calories 30% from fat and 7% or less from saturated fats, daily to have 5 or more servings of fruits and vegetables.  Biometrics:  Pre Biometrics - 07/18/22 1033       Pre Biometrics   Height 5\' 5"  (1.651 m)    Weight 78.1 kg    Waist Circumference 34 inches    Hip Circumference 38 inches    Waist to Hip Ratio 0.89 %    BMI (Calculated) 28.64    Triceps Skinfold 15 mm    % Body Fat 25.4 %    Grip Strength 31.6 kg    Single Leg Stand 12.7 seconds              Nutrition Therapy Plan and Nutrition Goals:  Nutrition Therapy & Goals - 07/18/22 0755       Intervention Plan   Intervention Prescribe, educate and counsel regarding individualized specific dietary modifications aiming towards targeted core components such as weight, hypertension, lipid management, diabetes, heart failure and other comorbidities.;Nutrition handout(s) given to patient.    Expected Outcomes Short Term Goal: Understand basic principles of dietary content, such as calories, fat, sodium, cholesterol and nutrients.             Nutrition Assessments:  Nutrition Assessments - 07/18/22 1052       MEDFICTS Scores   Pre Score 116            MEDIFICTS Score Key: ?70 Need to make dietary changes  40-70 Heart Healthy Diet ? 40 Therapeutic Level  Cholesterol Diet   Picture Your Plate Scores: <09 Unhealthy dietary pattern with much room for improvement. 41-50 Dietary pattern unlikely to meet recommendations for good health and room for improvement. 51-60 More healthful  dietary pattern, with some room for improvement.  >60 Healthy dietary pattern, although there may be some specific behaviors that could be improved.    Nutrition Goals Re-Evaluation:   Nutrition Goals Discharge (Final Nutrition Goals Re-Evaluation):   Psychosocial: Target Goals: Acknowledge presence or absence of significant depression and/or stress, maximize coping skills, provide positive support system. Participant is able to verbalize types and ability to use techniques and skills needed for reducing stress and depression.  Initial Review & Psychosocial Screening:  Initial Psych Review & Screening - 07/18/22 0753       Initial Review   Current issues with Current Stress Concerns;None Identified;Current Sleep Concerns    Source of Stress Concerns Chronic Illness    Comments only sleeps 3-4 hrs a night (chronic issue), SOB is limiting his ability to do things      Family Dynamics   Good Support System? Yes   wife and kids that come by to check weekly     Barriers   Psychosocial barriers to participate in program Psychosocial barriers identified (see note);The patient should benefit from training in stress management and relaxation.      Screening Interventions   Interventions Encouraged to exercise;Provide feedback about the scores to participant    Expected Outcomes Short Term goal: Utilizing psychosocial counselor, staff and physician to assist with identification of specific Stressors or current issues interfering with healing process. Setting desired goal for each stressor or current issue identified.;Long Term Goal: Stressors or current issues are controlled or eliminated.;Short Term goal: Identification and review with participant of any Quality of  Life or Depression concerns found by scoring the questionnaire.;Long Term goal: The participant improves quality of Life and PHQ9 Scores as seen by post scores and/or verbalization of changes             Quality of Life Scores:  Quality of Life - 07/18/22 1052       Quality of Life   Select Quality of Life      Quality of Life Scores   Health/Function Pre 21.38 %    Socioeconomic Pre 29.14 %    Psych/Spiritual Pre 30 %    Family Pre 24 %    GLOBAL Pre 25.03 %            Scores of 19 and below usually indicate a poorer quality of life in these areas.  A difference of  2-3 points is a clinically meaningful difference.  A difference of 2-3 points in the total score of the Quality of Life Index has been associated with significant improvement in overall quality of life, self-image, physical symptoms, and general health in studies assessing change in quality of life.   PHQ-9: Review Flowsheet       07/18/2022 01/09/2020 02/07/2011  Depression screen PHQ 2/9  Decreased Interest 0 0 0  Down, Depressed, Hopeless 0 0 0  PHQ - 2 Score 0 0 0  Altered sleeping 0 1 -  Tired, decreased energy 3 1 -  Change in appetite 0 0 -  Feeling bad or failure about yourself  0 0 -  Trouble concentrating 0 0 -  Moving slowly or fidgety/restless 0 0 -  Suicidal thoughts 0 0 -  PHQ-9 Score 3 2 -  Difficult doing work/chores Not difficult at all Not difficult at all -    Details           Interpretation of Total Score  Total Score Depression Severity:  1-4 = Minimal depression, 5-9 =  Mild depression, 10-14 = Moderate depression, 15-19 = Moderately severe depression, 20-27 = Severe depression   Psychosocial Evaluation and Intervention:  Psychosocial Evaluation - 07/18/22 1039       Psychosocial Evaluation & Interventions   Interventions Relaxation education;Encouraged to exercise with the program and follow exercise prescription    Comments Vishruth is coming into Pulmonary Rehab via the  [place].  He requested to return to [place] as he has done program before and found in beneficial.  He used to help and support local high school, but has reitred from school as of last fall and has found that not being as active he is now feeling increased fatigue and DOE more than he has ever before.  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  He really wants to attend rehab to get moving again and to breathe better.  He also wants to boost his stamina and energy level so that he feels more like himself again.  He still goes for a walk most days but then finds he needs to rest afterwards.  His daughter is a Systems analyst and she like all his kids are constantly on him about moving more.  He lives with his wife who is still working as well.  His kids all live near by and drop by to check in and call frequently.  He does not have any barriers to getting to rehab regularly.  He has a long history of not sleeping much and usually only gets 3-4 hrs a night and feels like that is all he needs.  He does use a CPAP at night but not fully compliant as he has a hard time getting it back on if and when he gets up to go to bathroom. He has history of frequent falls from shuffling his feet, so he is hoping more exercise will help with that too.    Expected Outcomes Short: Attend rehab regularly to build stamina Long: Continue to stay positive and feel better    Continue Psychosocial Services  Follow up required by staff             Psychosocial Re-Evaluation:   Psychosocial Discharge (Final Psychosocial Re-Evaluation):    Education: Education Goals: Education classes will be provided on a weekly basis, covering required topics. Participant will state understanding/return demonstration of topics presented.  Learning Barriers/Preferences:  Learning Barriers/Preferences - 07/18/22 0752       Learning Barriers/Preferences   Learning Barriers Sight    glasses   Learning Preferences Skilled Demonstration             Education Topics: How Lungs Work and Diseases: - Discuss the anatomy of the lungs and diseases that can affect the lungs, such as COPD.   Exercise: -Discuss the importance of exercise, FITT principles of exercise, normal and abnormal responses to exercise, and how to exercise safely.   Environmental Irritants: -Discuss types of environmental irritants and how to limit exposure to environmental irritants. Flowsheet Row PULMONARY REHAB OTHER RESPIRATORY from 03/25/2020 in [facility] CARDIAC REHABILITATION  Date 01/15/20  Educator DJ  Instruction Review Code 1- Verbalizes Understanding       Meds/Inhalers and oxygen: - Discuss respiratory medications, definition of an inhaler and oxygen, and the proper way to use an inhaler and oxygen. Flowsheet Row PULMONARY REHAB OTHER RESPIRATORY from 03/25/2020 in [facility] CARDIAC REHABILITATION  Date 01/15/20  Educator Samson Frederic  Energy Saving Techniques: - Discuss methods to conserve energy and decrease shortness of breath when performing activities of daily living.  Flowsheet Row PULMONARY REHAB OTHER RESPIRATORY from 03/25/2020 in [facility] [facility] REHABILITATION  Date 01/29/20  Educator mk  Instruction Review Code 2- Demonstrated Understanding       Bronchial Hygiene / Breathing Techniques: - Discuss breathing mechanics, pursed-lip breathing technique,  proper posture, effective ways to clear airways, and other functional breathing techniques Flowsheet Row PULMONARY REHAB OTHER RESPIRATORY from 03/25/2020 in [place] [facility]  Date 02/05/20  Educator Hand out  Instruction Review Code 1- Verbalizes Understanding       Cleaning Equipment: - Provides group verbal and written instruction about the health risks of elevated stress, cause of high stress, and healthy ways to reduce stress. Flowsheet Row PULMONARY REHAB OTHER RESPIRATORY from  03/25/2020 in [place] [facility]  Date 02/12/20  Educator Handout  Instruction Review Code 1- Verbalizes Understanding       Nutrition I: Fats: - Discuss the types of cholesterol, what cholesterol does to the body, and how cholesterol levels can be controlled. Flowsheet Row PULMONARY REHAB OTHER RESPIRATORY from 03/25/2020 in [place] [facility]  Date 02/19/20  Educator Handout  Instruction Review Code 1- Verbalizes Understanding       Nutrition II: Labels: -Discuss the different components of food labels and how to read food labels. Flowsheet Row PULMONARY REHAB OTHER RESPIRATORY from 03/25/2020 in [place] [facility]  Date 02/26/20  Educator DJ  Instruction Review Code 1- Verbalizes Understanding       Respiratory Infections: - Discuss the signs and symptoms of respiratory infections, ways to prevent respiratory infections, and the importance of seeking medical treatment when having a respiratory infection. Flowsheet Row PULMONARY REHAB OTHER RESPIRATORY from 03/25/2020 in [place] [facility]  Date 03/04/20  Educator Handout  Instruction Review Code 1- Verbalizes Understanding       Stress I: Signs and Symptoms: - Discuss the causes of stress, how stress may lead to anxiety and depression, and ways to limit stress. Flowsheet Row PULMONARY REHAB OTHER RESPIRATORY from 03/25/2020 in [place] [facility]  Date 03/11/20  Educator [facility]  Instruction Review Code 1- Verbalizes Understanding       Stress II: Relaxation: -Discuss relaxation techniques to limit stress. Flowsheet Row PULMONARY REHAB OTHER RESPIRATORY from 03/25/2020 in [place] [facility]  Date 03/18/20  Educator [facility]  Instruction Review Code 1- Verbalizes Understanding       Oxygen for Home/Travel: - Discuss how to prepare for travel when on oxygen and proper ways to transport and store oxygen to ensure safety. Flowsheet Row  PULMONARY REHAB OTHER RESPIRATORY from 03/25/2020 in [place] [facility]  Date 03/25/20  Educator DF  Instruction Review Code 2- Demonstrated Understanding       Knowledge Questionnaire Score:  Knowledge Questionnaire Score - 07/18/22 1100       Knowledge Questionnaire Score   Pre Score 12/18             Core Components/Risk Factors/Patient Goals at Admission:  Personal Goals and Risk Factors at Admission - 07/18/22 1100       Core Components/Risk Factors/Patient Goals on Admission    Weight Management Yes;Weight Loss    Intervention Weight Management: Develop a combined nutrition and exercise program designed to reach desired caloric intake, while maintaining appropriate intake of nutrient and fiber, sodium and fats, and appropriate energy expenditure required for the weight goal.;Weight Management: Provide education  and appropriate resources to help participant work on and attain dietary goals.;Weight Management/Obesity: Establish reasonable short term and long term weight goals.    Admit Weight 172 lb 1.6 oz (78.1 kg)    Goal Weight: Short Term 170 lb (77.1 kg)    Goal Weight: Long Term 170 lb (77.1 kg)    Expected Outcomes Short Term: Continue to assess and modify interventions until short term weight is achieved;Long Term: Adherence to nutrition and physical activity/exercise program aimed toward attainment of established weight goal;Weight Loss: Understanding of general recommendations for a balanced deficit meal plan, which promotes 1-2 lb weight loss per week and includes a negative energy balance of [phone] kcal/d;Understanding recommendations for meals to include 15-35% energy as protein, 25-35% energy from fat, 35-60% energy from carbohydrates, less than 200mg  of dietary cholesterol, 20-35 gm of total fiber daily;Understanding of distribution of calorie intake throughout the day with the consumption of 4-5 meals/snacks    Tobacco Cessation Yes    Number of  packs per day 3 per day (1 pack per week)    Intervention Assist the participant in steps to quit. Provide individualized education and counseling about committing to Tobacco Cessation, relapse prevention, and pharmacological support that can be provided by physician.;Education officer, environmental, assist with locating and accessing local/national Quit Smoking programs, and support quit date choice.    Expected Outcomes Short Term: Will demonstrate readiness to quit, by selecting a quit date.;Short Term: Will quit all tobacco product use, adhering to prevention of relapse plan.;Long Term: Complete abstinence from all tobacco products for at least 12 months from quit date.    Improve shortness of breath with ADL's Yes    Intervention Provide education, individualized exercise plan and daily activity instruction to help decrease symptoms of SOB with activities of daily living.    Expected Outcomes Short Term: Improve cardiorespiratory fitness to achieve a reduction of symptoms when performing ADLs;Long Term: Be able to perform more ADLs without symptoms or delay the onset of symptoms    Increase knowledge of respiratory medications and ability to use respiratory devices properly  Yes    Intervention Provide education and demonstration as needed of appropriate use of medications, inhalers, and oxygen therapy.    Expected Outcomes Long Term: Maintain appropriate use of medications, inhalers, and oxygen therapy.;Short Term: Achieves understanding of medications use. Understands that oxygen is a medication prescribed by physician. Demonstrates appropriate use of inhaler and oxygen therapy.    Hypertension Yes    Intervention Provide education on lifestyle modifcations including regular physical activity/exercise, weight management, moderate sodium restriction and increased consumption of fresh fruit, vegetables, and low fat dairy, alcohol moderation, and smoking cessation.;Monitor prescription use compliance.     Expected Outcomes Long Term: Maintenance of blood pressure at goal levels.;Short Term: Continued assessment and intervention until BP is < 140/78mm HG in hypertensive participants. < 130/85mm HG in hypertensive participants with diabetes, heart failure or chronic kidney disease.    Lipids Yes    Intervention Provide education and support for participant on nutrition & aerobic/resistive exercise along with prescribed medications to achieve LDL 70mg , HDL >40mg .    Expected Outcomes Long Term: Cholesterol controlled with medications as prescribed, with individualized exercise RX and with personalized nutrition plan. Value goals: LDL < 70mg , HDL > 40 mg.;Short Term: Participant states understanding of desired cholesterol values and is compliant with medications prescribed. Participant is following exercise prescription and nutrition guidelines.             Core Components/Risk  Factors/Patient Goals Review:   Goals and Risk Factor Review     Row Name 07/18/22 1110             Core Components/Risk Factors/Patient Goals Review   Personal Goals Review Tobacco Cessation       Review Pt has worked from 1 ppd down to 3 cigarettes a day.  He would like to continue to work towards quitting.  He no longer takes them out of the house so is already making strides.  He was commended for his success already.       Expected Outcomes Short: Continue to work towards quitting completely  Long: Complete cessation                Core Components/Risk Factors/Patient Goals at Discharge (Final Review):   Goals and Risk Factor Review - 07/18/22 1110       Core Components/Risk Factors/Patient Goals Review   Personal Goals Review Tobacco Cessation    Review Pt has worked from 1 ppd down to 3 cigarettes a day.  He would like to continue to work towards quitting.  He no longer takes them out of the house so is already making strides.  He was commended for his success already.    Expected Outcomes Short:  Continue to work towards quitting completely  Long: Complete cessation             ITP Comments:  ITP Comments     Row Name 07/18/22 0941           ITP Comments Patient attend orientation today.  Patient is attending[facility]ulmonary [facility] Program.  Documentation for diagnosis can be found in [place] notes 03/28/22 and 07/12/22.  Reviewed medical chart, RPE/RPD, gym safety, and program guidelines.  Patient was fitted to equipment they will be using during rehab.  Patient is scheduled to start exercise on 07/20/22 at 1045.                Comments: Initial ITP

## 2022-07-18 NOTE — Patient Instructions (Signed)
Patient Instructions  Patient Details  Name: Trevor Key MRN: [id number] Date of Birth: 1955-05-20 Referring Provider:  Corlis Leak*  Below are your personal goals for exercise, nutrition, and risk factors. Our goal is to help you stay on track towards obtaining and maintaining these goals. We will be discussing your progress on these goals with you throughout the program.  Initial Exercise Prescription:  Initial Exercise Prescription - 07/18/22 0900       Date of Initial Exercise RX and Referring Provider   Date 07/18/22    Referring Provider Vito Berger, NP   Dr. Pernell Dupre (attending MD)     Oxygen   Maintain Oxygen Saturation 88% or higher      Treadmill   MPH 2.5    Grade 0.5    Minutes 15    METs 3.09      REL-XR   Level 3    Speed 50    Minutes 15    METs 3.2      Prescription Details   Frequency (times per week) 2    Duration Progress to 30 minutes of continuous aerobic without signs/symptoms of physical distress      Intensity   THRR 40-80% of Max Heartrate 98-135    Ratings of Perceived Exertion 11-13    Perceived Dyspnea 0-4      Progression   Progression Continue to progress workloads to maintain intensity without signs/symptoms of physical distress.      Resistance Training   Training Prescription Yes    Weight 5 lb    Reps 10-15             Exercise Goals: Frequency: Be able to perform aerobic exercise two to three times per week in program working toward 2-5 days per week of home exercise.  Intensity: Work with a perceived exertion of 11 (fairly light) - 15 (hard) while following your exercise prescription.  We will make changes to your prescription with you as you progress through the program.   Duration: Be able to do 30 to 45 minutes of continuous aerobic exercise in addition to a 5 minute warm-up and a 5 minute cool-down routine.   Nutrition Goals: Your personal nutrition goals will be established when you do your  nutrition analysis with the dietician.  The following are general nutrition guidelines to follow: Cholesterol < 200mg /day Sodium < 1500mg /day Fiber: Men over 50 yrs - 30 grams per day  Personal Goals:  Personal Goals and Risk Factors at Admission - 07/18/22 1100       Core Components/Risk Factors/Patient Goals on Admission    Weight Management Yes;Weight Loss    Intervention Weight Management: Develop a combined nutrition and exercise program designed to reach desired caloric intake, while maintaining appropriate intake of nutrient and fiber, sodium and fats, and appropriate energy expenditure required for the weight goal.;Weight Management: Provide education and appropriate resources to help participant work on and attain dietary goals.;Weight Management/Obesity: Establish reasonable short term and long term weight goals.    Admit Weight 172 lb 1.6 oz (78.1 kg)    Goal Weight: Short Term 170 lb (77.1 kg)    Goal Weight: Long Term 170 lb (77.1 kg)    Expected Outcomes Short Term: Continue to assess and modify interventions until short term weight is achieved;Long Term: Adherence to nutrition and physical activity/exercise program aimed toward attainment of established weight goal;Weight Loss: Understanding of general recommendations for a balanced deficit meal plan, which promotes 1-2 lb weight loss  per week and includes a negative energy balance of [phone] kcal/d;Understanding recommendations for meals to include 15-35% energy as protein, 25-35% energy from fat, 35-60% energy from carbohydrates, less than 200mg  of dietary cholesterol, 20-35 gm of total fiber daily;Understanding of distribution of calorie intake throughout the day with the consumption of 4-5 meals/snacks    Tobacco Cessation Yes    Number of packs per day 3 per day (1 pack per week)    Intervention Assist the participant in steps to quit. Provide individualized education and counseling about committing to Tobacco Cessation,  relapse prevention, and pharmacological support that can be provided by physician.;Education officer, environmental, assist with locating and accessing local/national Quit Smoking programs, and support quit date choice.    Expected Outcomes Short Term: Will demonstrate readiness to quit, by selecting a quit date.;Short Term: Will quit all tobacco product use, adhering to prevention of relapse plan.;Long Term: Complete abstinence from all tobacco products for at least 12 months from quit date.    Improve shortness of breath with ADL's Yes    Intervention Provide education, individualized exercise plan and daily activity instruction to help decrease symptoms of SOB with activities of daily living.    Expected Outcomes Short Term: Improve cardiorespiratory fitness to achieve a reduction of symptoms when performing ADLs;Long Term: Be able to perform more ADLs without symptoms or delay the onset of symptoms    Increase knowledge of respiratory medications and ability to use respiratory devices properly  Yes    Intervention Provide education and demonstration as needed of appropriate use of medications, inhalers, and oxygen therapy.    Expected Outcomes Long Term: Maintain appropriate use of medications, inhalers, and oxygen therapy.;Short Term: Achieves understanding of medications use. Understands that oxygen is a medication prescribed by physician. Demonstrates appropriate use of inhaler and oxygen therapy.    Hypertension Yes    Intervention Provide education on lifestyle modifcations including regular physical activity/exercise, weight management, moderate sodium restriction and increased consumption of fresh fruit, vegetables, and low fat dairy, alcohol moderation, and smoking cessation.;Monitor prescription use compliance.    Expected Outcomes Long Term: Maintenance of blood pressure at goal levels.;Short Term: Continued assessment and intervention until BP is < 140/58mm HG in hypertensive participants. <  130/74mm HG in hypertensive participants with diabetes, heart failure or chronic kidney disease.    Lipids Yes    Intervention Provide education and support for participant on nutrition & aerobic/resistive exercise along with prescribed medications to achieve LDL 70mg , HDL >40mg .    Expected Outcomes Long Term: Cholesterol controlled with medications as prescribed, with individualized exercise RX and with personalized nutrition plan. Value goals: LDL < 70mg , HDL > 40 mg.;Short Term: Participant states understanding of desired cholesterol values and is compliant with medications prescribed. Participant is following exercise prescription and nutrition guidelines.             Tobacco Use Initial Evaluation: Social History   Tobacco Use  Smoking Status Every Day   Current packs/day: 0.25   Average packs/day: 0.3 packs/day for 40.0 years (10.0 ttl pk-yrs)   Types: Cigarettes  Smokeless Tobacco Never  Tobacco Comments   He is currently enrolled in a smoking cessation program through the [place]. He reports that it is working.    Exercise Goals and Review:  Exercise Goals     Row Name 07/18/22 0955             Exercise Goals   Increase Physical Activity Yes       Intervention  Provide advice, education, support and counseling about physical activity/exercise needs.;Develop an individualized exercise prescription for aerobic and resistive training based on initial evaluation findings, risk stratification, comorbidities and participant's personal goals.       Expected Outcomes Short Term: Attend rehab on a regular basis to increase amount of physical activity.;Long Term: Add in home exercise to make exercise part of routine and to increase amount of physical activity.;Long Term: Exercising regularly at least 3-5 days a week.       Increase Strength and Stamina Yes       Intervention Provide advice, education, support and counseling about physical activity/exercise needs.;Develop an  individualized exercise prescription for aerobic and resistive training based on initial evaluation findings, risk stratification, comorbidities and participant's personal goals.       Expected Outcomes Short Term: Increase workloads from initial exercise prescription for resistance, speed, and METs.;Short Term: Perform resistance training exercises routinely during rehab and add in resistance training at home;Long Term: Improve cardiorespiratory fitness, muscular endurance and strength as measured by increased METs and functional capacity ( )       Able to understand and use rate of perceived exertion (RPE) scale Yes       Intervention Provide education and explanation on how to use RPE scale       Expected Outcomes Short Term: Able to use RPE daily in rehab to express subjective intensity level;Long Term:  Able to use RPE to guide intensity level when exercising independently       Able to understand and use Dyspnea scale Yes       Intervention Provide education and explanation on how to use Dyspnea scale       Expected Outcomes Short Term: Able to use Dyspnea scale daily in rehab to express subjective sense of shortness of breath during exertion;Long Term: Able to use Dyspnea scale to guide intensity level when exercising independently       Knowledge and understanding of Target Heart Rate Range (THRR) Yes       Intervention Provide education and explanation of THRR including how the numbers were predicted and where they are located for reference       Expected Outcomes Short Term: Able to state/look up THRR;Short Term: Able to use daily as guideline for intensity in rehab;Long Term: Able to use THRR to govern intensity when exercising independently       Able to check pulse independently Yes       Intervention Provide education and demonstration on how to check pulse in carotid and radial arteries.;Review the importance of being able to check your own pulse for safety during independent exercise        Expected Outcomes Short Term: Able to explain why pulse checking is important during independent exercise;Long Term: Able to check pulse independently and accurately       Understanding of Exercise Prescription Yes       Intervention Provide education, explanation, and written materials on patient's individual exercise prescription       Expected Outcomes Short Term: Able to explain program exercise prescription;Long Term: Able to explain home exercise prescription to exercise independently              Copy of goals given to participant.

## 2022-07-19 ENCOUNTER — Encounter (HOSPITAL_COMMUNITY): Payer: Self-pay | Admitting: *Deleted

## 2022-07-19 DIAGNOSIS — J449 Chronic obstructive pulmonary disease, unspecified: Secondary | ICD-10-CM

## 2022-07-19 DIAGNOSIS — R0609 Other forms of dyspnea: Secondary | ICD-10-CM

## 2022-07-19 NOTE — Progress Notes (Signed)
Pulmonary Individual Treatment Plan  Patient Details  Name: Trevor Key MRN: [id number] Date of Birth: 12-28-1955 Referring Provider:   Flowsheet Row PULMONARY REHAB COPD ORIENTATION from 07/18/2022 in [facility] CARDIAC REHABIL[facility]ATION  Referring Provider Vito Berger, NP  [Dr. Pernell Dupre (attending MD)]       Initial Encounter Date:  Flowsheet Row PULMONARY REHAB COPD ORIENTATION from 07/18/2022 in [facility]  Date 07/18/22       Visit Diagnosis: Dyspnea on exertion  Chronic obstructive pulmonary disease, unspecified COPD type (HCC)  Patient's Home Medications on Admission:   Current Outpatient Medications:    albuterol (PROVENTIL HFA;VENTOLIN HFA) 108 (90 BASE) MCG/ACT inhaler, Inhale 1-2 puffs into the lungs every 6 (six) hours as needed for wheezing., Disp: 1 Inhaler, Rfl: 0   aspirin 81 MG chewable tablet, Chew 81 mg by mouth daily., Disp: , Rfl:    atorvastatin (LIPITOR) 80 MG tablet, Take 80 mg by mouth daily., Disp: , Rfl:    cholecalciferol (VITAMIN D3) 25 MCG (1000 UNIT) tablet, Take 1,000 Units by mouth daily., Disp: , Rfl:    losartan (COZAAR) 100 MG tablet, Take 100 mg by mouth daily., Disp: , Rfl:    metoprolol succinate (TOPROL-XL) 50 MG 24 hr tablet, Take 50 mg by mouth daily. Take with or immediately following a meal., Disp: , Rfl:    nitroGLYCERIN (NITROLINGUAL) 0.4 MG/SPRAY spray, Place 1 spray under the tongue every 5 (five) minutes as needed. For chest pain., Disp: , Rfl:    omeprazole (PRILOSEC) 20 MG capsule, Take 20 mg by mouth daily., Disp: , Rfl:    primidone (MYSOLINE) 50 MG tablet, Take 50 mg by mouth daily., Disp: , Rfl:    tiotropium (SPIRIVA) 18 MCG inhalation capsule, Place 18 mcg into inhaler and inhale daily., Disp: , Rfl:   Past Medical History: Past Medical History:  Diagnosis Date   Coronary atherosclerosis of native coronary artery    BMS LAD 1/13, LVEF 55-60%   Hypercholesteremia    Hypertension    MI  (myocardial infarction) (HCC)    AMI 1/13    Tobacco Use: Social History   Tobacco Use  Smoking Status Every Day   Current packs/day: 0.25   Average packs/day: 0.3 packs/day for 40.0 years (10.0 ttl pk-yrs)   Types: Cigarettes  Smokeless Tobacco Never  Tobacco Comments   He is currently enrolled in a smoking cessation program through the [place]. He reports that it is working.    Labs: Review Flowsheet       Latest Ref Rng & Units 03/27/2011 10/28/2011 05/19/2016 05/20/2016  Labs for ITP Cardiac and Pulmonary Rehab  Cholestrol 0 - 200 mg/dL 865  - - 784   LDL (calc) 0 - 99 mg/dL 66  - - 71   HDL-C >69 mg/dL 33  - - 33   Trlycerides <150 mg/dL 96  - - 93   Hemoglobin A1c 4.8 - 5.6 % - - - 5.9   TCO2 0 - 100 mmol/L - 26  28  -    Details            Capillary Blood Glucose: No results found for: "GLUCAP"   Pulmonary Assessment Scores:  Pulmonary Assessment Scores     Row Name 07/18/22 1123         ADL UCSD   ADL Phase Entry     SOB Score total 87     Rest 0     Walk 1  Stairs 5     Bath 3     Dress 3     Shop 5       CAT Score   CAT Score 22       mMRC Score   mMRC Score 1             UCSD: Self-administered rating of dyspnea associated with activities of daily living (ADLs) 6-point scale (0 = "not at all" to 5 = "maximal or unable to do because of breathlessness")  Scoring Scores range from 0 to 120.  Minimally important difference is 5 units  CAT: CAT can identify the health impairment of COPD patients and is better correlated with disease progression.  CAT has a scoring range of zero to 40. The CAT score is classified into four groups of low (less than 10), medium (10 - 20), high (21-30) and very high (31-40) based on the impact level of disease on health status. A CAT score over 10 suggests significant symptoms.  A worsening CAT score could be explained by an exacerbation, poor medication adherence, poor inhaler technique, or progression of COPD  or comorbid conditions.  CAT MCID is 2 points  mMRC: mMRC (Modified Medical Research Council) Dyspnea Scale is used to assess the degree of baseline functional disability in patients of respiratory disease due to dyspnea. No minimal important difference is established. A decrease in score of 1 point or greater is considered a positive change.   Pulmonary Function Assessment:  Pulmonary Function Assessment - 07/18/22 1126       Breath   Shortness of Breath Yes;Limiting activity;Fear of Shortness of Breath             Exercise Target Goals: Exercise Program Goal: Individual exercise prescription set using results from initial 6 min walk test and THRR while considering  patient's activity barriers and safety.   Exercise Prescription Goal: Initial exercise prescription builds to 30-45 minutes a day of aerobic activity, 2-3 days per week.  Home exercise guidelines will be given to patient during program as part of exercise prescription that the participant will acknowledge.  Activity Barriers & Risk Stratification:  Activity Barriers & Cardiac Risk Stratification - 07/18/22 0756       Activity Barriers & Cardiac Risk Stratification   Activity Barriers Arthritis;Joint Problems;History of Falls   R shoulder bothersome            6 Minute Walk:  6 Minute Walk     Row Name 07/18/22 0944         6 Minute Walk   Phase Initial     Distance 1374 feet     Walk Time 6 minutes     # of Rest Breaks 0     MPH 2.6     METS 3.28     RPE 12     Perceived Dyspnea  3     VO2 Peak 11.49     Symptoms Yes (comment)     Comments SOB, chest pain 3/10 at end     Resting HR 61 bpm     Resting BP 146/74     Resting Oxygen Saturation  97 %     Exercise Oxygen Saturation  during 6 min walk 95 %     Max Ex. HR 89 bpm     Max Ex. BP 176/64     2 Minute Post BP 132/64       Interval HR   1 Minute HR 65  2 Minute HR --  error     3 Minute HR --  error     4 Minute HR --  error      5 Minute HR --  error     6 Minute HR 89     2 Minute Post HR 69     Interval Heart Rate? Yes       Interval Oxygen   Interval Oxygen? Yes     Baseline Oxygen Saturation % 97 %     1 Minute Oxygen Saturation % 96 %     1 Minute Liters of Oxygen 0 L  Room Air     2 Minute Oxygen Saturation % 95 %     2 Minute Liters of Oxygen 0 L     3 Minute Oxygen Saturation % 95 %     3 Minute Liters of Oxygen 0 L     4 Minute Oxygen Saturation % 95 %     4 Minute Liters of Oxygen 0 L     5 Minute Oxygen Saturation % 95 %     5 Minute Liters of Oxygen 0 L     6 Minute Oxygen Saturation % 95 %     6 Minute Liters of Oxygen 0 L     2 Minute Post Oxygen Saturation % 96 %     2 Minute Post Liters of Oxygen 0 L              Oxygen Initial Assessment:  Oxygen Initial Assessment - 07/18/22 1121       Home Oxygen   Home Oxygen Device None    Sleep Oxygen Prescription CPAP    Liters per minute 0    Home Exercise Oxygen Prescription None    Home Resting Oxygen Prescription None    Compliance with Home Oxygen Use No   does not put back on after getting up to go to bathroom     Initial 6 min Walk   Oxygen Used None      Program Oxygen Prescription   Program Oxygen Prescription None      Intervention   Short Term Goals To learn and exhibit compliance with exercise, home and travel O2 prescription;To learn and understand importance of monitoring SPO2 with pulse oximeter and demonstrate accurate use of the pulse oximeter.;To learn and understand importance of maintaining oxygen saturations>88%;To learn and demonstrate proper pursed lip breathing techniques or other breathing techniques. ;To learn and demonstrate proper use of respiratory medications    Long  Term Goals Exhibits compliance with exercise, home  and travel O2 prescription;Verbalizes importance of monitoring SPO2 with pulse oximeter and return demonstration;Maintenance of O2 saturations>88%;Exhibits proper breathing techniques,  such as pursed lip breathing or other method taught during program session;Compliance with respiratory medication;Demonstrates proper use of MDI's             Oxygen Re-Evaluation:  Oxygen Re-Evaluation     Row Name 07/18/22 1122             Goals/Expected Outcomes   Short Term Goals To learn and demonstrate proper pursed lip breathing techniques or other breathing techniques. ;To learn and understand importance of maintaining oxygen saturations>88%;To learn and understand importance of monitoring SPO2 with pulse oximeter and demonstrate accurate use of the pulse oximeter.       Long  Term Goals Exhibits proper breathing techniques, such as pursed lip breathing or other method taught during program session;Maintenance of O2 saturations>88%;Verbalizes importance of monitoring  SPO2 with pulse oximeter and return demonstration       Comments Reviewed PLB technique with pt.  Talked about how it works and it's importance in maintaining their exercise saturations.       Goals/Expected Outcomes Short: Become more profiecient at using PLB.   Long: Become independent at using PLB.                Oxygen Discharge (Final Oxygen Re-Evaluation):  Oxygen Re-Evaluation - 07/18/22 1122       Goals/Expected Outcomes   Short Term Goals To learn and demonstrate proper pursed lip breathing techniques or other breathing techniques. ;To learn and understand importance of maintaining oxygen saturations>88%;To learn and understand importance of monitoring SPO2 with pulse oximeter and demonstrate accurate use of the pulse oximeter.    Long  Term Goals Exhibits proper breathing techniques, such as pursed lip breathing or other method taught during program session;Maintenance of O2 saturations>88%;Verbalizes importance of monitoring SPO2 with pulse oximeter and return demonstration    Comments Reviewed PLB technique with pt.  Talked about how it works and it's importance in maintaining their exercise  saturations.    Goals/Expected Outcomes Short: Become more profiecient at using PLB.   Long: Become independent at using PLB.             Initial Exercise Prescription:  Initial Exercise Prescription - 07/18/22 0900       Date of Initial Exercise RX and Referring Provider   Date 07/18/22    Referring Provider Vito Berger, NP   Dr. Pernell Dupre (attending MD)     Oxygen   Maintain Oxygen Saturation 88% or higher      Treadmill   MPH 2.5    Grade 0.5    Minutes 15    METs 3.09      REL-XR   Level 3    Speed 50    Minutes 15    METs 3.2      Prescription Details   Frequency (times per week) 2    Duration Progress to 30 minutes of continuous aerobic without signs/symptoms of physical distress      Intensity   THRR 40-80% of Max Heartrate 98-135    Ratings of Perceived Exertion 11-13    Perceived Dyspnea 0-4      Progression   Progression Continue to progress workloads to maintain intensity without signs/symptoms of physical distress.      Resistance Training   Training Prescription Yes    Weight 5 lb    Reps 10-15             Perform Capillary Blood Glucose checks as needed.  Exercise Prescription Changes:   Exercise Prescription Changes     Row Name 07/18/22 0900             Response to Exercise   Blood Pressure (Admit) 146/74       Blood Pressure (Exercise) 176/64       Blood Pressure (Exit) 132/64       Heart Rate (Admit) 61 bpm       Heart Rate (Exercise) 89 bpm       Heart Rate (Exit) 72 bpm       Oxygen Saturation (Admit) 97 %       Oxygen Saturation (Exercise) 95 %       Oxygen Saturation (Exit) 97 %       Rating of Perceived Exertion (Exercise) 12       Perceived Dyspnea (Exercise) 3  Symptoms SOB, chest pain (3/10)       Comments walk test results                Exercise Comments:   Exercise Goals and Review:   Exercise Goals     Row Name 07/18/22 0955             Exercise Goals   Increase Physical  Activity Yes       Intervention Provide advice, education, support and counseling about physical activity/exercise needs.;Develop an individualized exercise prescription for aerobic and resistive training based on initial evaluation findings, risk stratification, comorbidities and participant's personal goals.       Expected Outcomes Short Term: Attend rehab on a regular basis to increase amount of physical activity.;Long Term: Add in home exercise to make exercise part of routine and to increase amount of physical activity.;Long Term: Exercising regularly at least 3-5 days a week.       Increase Strength and Stamina Yes       Intervention Provide advice, education, support and counseling about physical activity/exercise needs.;Develop an individualized exercise prescription for aerobic and resistive training based on initial evaluation findings, risk stratification, comorbidities and participant's personal goals.       Expected Outcomes Short Term: Increase workloads from initial exercise prescription for resistance, speed, and METs.;Short Term: Perform resistance training exercises routinely during rehab and add in resistance training at home;Long Term: Improve cardiorespiratory fitness, muscular endurance and strength as measured by increased METs and functional capacity ( )       Able to understand and use rate of perceived exertion (RPE) scale Yes       Intervention Provide education and explanation on how to use RPE scale       Expected Outcomes Short Term: Able to use RPE daily in rehab to express subjective intensity level;Long Term:  Able to use RPE to guide intensity level when exercising independently       Able to understand and use Dyspnea scale Yes       Intervention Provide education and explanation on how to use Dyspnea scale       Expected Outcomes Short Term: Able to use Dyspnea scale daily in rehab to express subjective sense of shortness of breath during exertion;Long Term: Able to  use Dyspnea scale to guide intensity level when exercising independently       Knowledge and understanding of Target Heart Rate Range (THRR) Yes       Intervention Provide education and explanation of THRR including how the numbers were predicted and where they are located for reference       Expected Outcomes Short Term: Able to state/look up THRR;Short Term: Able to use daily as guideline for intensity in rehab;Long Term: Able to use THRR to govern intensity when exercising independently       Able to check pulse independently Yes       Intervention Provide education and demonstration on how to check pulse in carotid and radial arteries.;Review the importance of being able to check your own pulse for safety during independent exercise       Expected Outcomes Short Term: Able to explain why pulse checking is important during independent exercise;Long Term: Able to check pulse independently and accurately       Understanding of Exercise Prescription Yes       Intervention Provide education, explanation, and written materials on patient's individual exercise prescription       Expected Outcomes Short Term: Able to  explain program exercise prescription;Long Term: Able to explain home exercise prescription to exercise independently                Exercise Goals Re-Evaluation :  Exercise Goals Re-Evaluation     Row Name 07/18/22 1033             Exercise Goal Re-Evaluation   Exercise Goals Review Able to understand and use rate of perceived exertion (RPE) scale;Able to understand and use Dyspnea scale;Understanding of Exercise Prescription       Comments Reviewed RPE  and dyspnea scale and program prescription with pt today.  Pt voiced understanding and was given a copy of goals to take home.       Expected Outcomes Short: Use RPE daily to regulate intensity.  Long: Follow program prescription                Discharge Exercise Prescription (Final Exercise Prescription Changes):   Exercise Prescription Changes - 07/18/22 0900       Response to Exercise   Blood Pressure (Admit) 146/74    Blood Pressure (Exercise) 176/64    Blood Pressure (Exit) 132/64    Heart Rate (Admit) 61 bpm    Heart Rate (Exercise) 89 bpm    Heart Rate (Exit) 72 bpm    Oxygen Saturation (Admit) 97 %    Oxygen Saturation (Exercise) 95 %    Oxygen Saturation (Exit) 97 %    Rating of Perceived Exertion (Exercise) 12    Perceived Dyspnea (Exercise) 3    Symptoms SOB, chest pain (3/10)    Comments walk test results             Nutrition:  Target Goals: Understanding of nutrition guidelines, daily intake of sodium 1500mg , cholesterol 200mg , calories 30% from fat and 7% or less from saturated fats, daily to have 5 or more servings of fruits and vegetables.  Biometrics:  Pre Biometrics - 07/18/22 1033       Pre Biometrics   Height 5\' 5"  (1.651 m)    Weight 172 lb 1.6 oz (78.1 kg)    Waist Circumference 34 inches    Hip Circumference 38 inches    Waist to Hip Ratio 0.89 %    BMI (Calculated) 28.64    Triceps Skinfold 15 mm    % Body Fat 25.4 %    Grip Strength 31.6 kg    Single Leg Stand 12.7 seconds              Nutrition Therapy Plan and Nutrition Goals:  Nutrition Therapy & Goals - 07/18/22 0755       Intervention Plan   Intervention Prescribe, educate and counsel regarding individualized specific dietary modifications aiming towards targeted core components such as weight, hypertension, lipid management, diabetes, heart failure and other comorbidities.;Nutrition handout(s) given to patient.    Expected Outcomes Short Term Goal: Understand basic principles of dietary content, such as calories, fat, sodium, cholesterol and nutrients.             Nutrition Assessments:  Nutrition Assessments - 07/18/22 1052       MEDFICTS Scores   Pre Score 116            MEDIFICTS Score Key: ?70 Need to make dietary changes  40-70 Heart Healthy Diet ? 40  Therapeutic Level Cholesterol Diet   Picture Your Plate Scores: <81 Unhealthy dietary pattern with much room for improvement. 41-50 Dietary pattern unlikely to meet recommendations for good health and room for  improvement. 51-60 More healthful dietary pattern, with some room for improvement.  >60 Healthy dietary pattern, although there may be some specific behaviors that could be improved.    Nutrition Goals Re-Evaluation:   Nutrition Goals Discharge (Final Nutrition Goals Re-Evaluation):   Psychosocial: Target Goals: Acknowledge presence or absence of significant depression and/or stress, maximize coping skills, provide positive support system. Participant is able to verbalize types and ability to use techniques and skills needed for reducing stress and depression.  Initial Review & Psychosocial Screening:  Initial Psych Review & Screening - 07/18/22 0753       Initial Review   Current issues with Current Stress Concerns;None Identified;Current Sleep Concerns    Source of Stress Concerns Chronic Illness    Comments only sleeps 3-4 hrs a night (chronic issue), SOB is limiting his ability to do things      Family Dynamics   Good Support System? Yes   wife and kids that come by to check weekly     Barriers   Psychosocial barriers to participate in program Psychosocial barriers identified (see note);The patient should benefit from training in stress management and relaxation.      Screening Interventions   Interventions Encouraged to exercise;Provide feedback about the scores to participant    Expected Outcomes Short Term goal: Utilizing psychosocial counselor, staff and physician to assist with identification of specific Stressors or current issues interfering with healing process. Setting desired goal for each stressor or current issue identified.;Long Term Goal: Stressors or current issues are controlled or eliminated.;Short Term goal: Identification and review with participant of  any Quality of Life or Depression concerns found by scoring the questionnaire.;Long Term goal: The participant improves quality of Life and PHQ9 Scores as seen by post scores and/or verbalization of changes             Quality of Life Scores:  Quality of Life - 07/18/22 1052       Quality of Life   Select Quality of Life      Quality of Life Scores   Health/Function Pre 21.38 %    Socioeconomic Pre 29.14 %    Psych/Spiritual Pre 30 %    Family Pre 24 %    GLOBAL Pre 25.03 %            Scores of 19 and below usually indicate a poorer quality of life in these areas.  A difference of  2-3 points is a clinically meaningful difference.  A difference of 2-3 points in the total score of the Quality of Life Index has been associated with significant improvement in overall quality of life, self-image, physical symptoms, and general health in studies assessing change in quality of life.   PHQ-9: Review Flowsheet       07/18/2022 01/09/2020 02/07/2011  Depression screen PHQ 2/9  Decreased Interest 0 0 0  Down, Depressed, Hopeless 0 0 0  PHQ - 2 Score 0 0 0  Altered sleeping 0 1 -  Tired, decreased energy 3 1 -  Change in appetite 0 0 -  Feeling bad or failure about yourself  0 0 -  Trouble concentrating 0 0 -  Moving slowly or fidgety/restless 0 0 -  Suicidal thoughts 0 0 -  PHQ-9 Score 3 2 -  Difficult doing work/chores Not difficult at all Not difficult at all -    Details           Interpretation of Total Score  Total Score Depression Severity:  1-4 =  Minimal depression, 5-9 = Mild depression, 10-14 = Moderate depression, 15-19 = Moderately severe depression, 20-27 = Severe depression   Psychosocial Evaluation and Intervention:  Psychosocial Evaluation - 07/18/22 1039       Psychosocial Evaluation & Interventions   Interventions Relaxation education;Encouraged to exercise with the program and follow exercise prescription    Comments Jerone is coming into Pulmonary  Rehab via the [place].  He requested to return to [place] as he has done program before and found in beneficial.  He used to help and support local high school, but has reitred from school as of last fall and has found that not being as active he is now feeling increased fatigue and DOE more than he has ever before.  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  He really wants to attend rehab to get moving again and to breathe better.  He also wants to boost his stamina and energy level so that he feels more like himself again.  He still goes for a walk most days but then finds he needs to rest afterwards.  His daughter is a Systems analyst and she like all his kids are constantly on him about moving more.  He lives with his wife who is still working as well.  His kids all live near by and drop by to check in and call frequently.  He does not have any barriers to getting to rehab regularly.  He has a long history of not sleeping much and usually only gets 3-4 hrs a night and feels like that is all he needs.  He does use a CPAP at night but not fully compliant as he has a hard time getting it back on if and when he gets up to go to bathroom. He has history of frequent falls from shuffling his feet, so he is hoping more exercise will help with that too.    Expected Outcomes Short: Attend rehab regularly to build stamina Long: Continue to stay positive and feel better    Continue Psychosocial Services  Follow up required by staff             Psychosocial Re-Evaluation:   Psychosocial Discharge (Final Psychosocial Re-Evaluation):    Education: Education Goals: Education classes will be provided on a weekly basis, covering required topics. Participant will state understanding/return demonstration of topics presented.  Learning Barriers/Preferences:  Learning Barriers/Preferences - 07/18/22 0752       Learning Barriers/Preferences   Learning Barriers  Sight   glasses   Learning Preferences Skilled Demonstration             Education Topics: How Lungs Work and Diseases: - Discuss the anatomy of the lungs and diseases that can affect the lungs, such as COPD.   Exercise: -Discuss the importance of exercise, FITT principles of exercise, normal and abnormal responses to exercise, and how to exercise safely.   Environmental Irritants: -Discuss types of environmental irritants and how to limit exposure to environmental irritants. Flowsheet Row PULMONARY REHAB OTHER RESPIRATORY from 03/25/2020 in [facility]  Date 01/15/20  Educator DJ  Instruction Review Code 1- Verbalizes Understanding       Meds/Inhalers and oxygen: - Discuss respiratory medications, definition of an inhaler and oxygen, and the proper way to use an inhaler and oxygen. Flowsheet Row PULMONARY REHAB OTHER RESPIRATORY from 03/25/2020 in [facility] CARDIAC REHABILITATION  Date 01/15/20  Educator Samson Frederic  Energy Saving Techniques: - Discuss methods to conserve energy and decrease shortness of breath when performing activities of daily living.  Flowsheet Row PULMONARY REHAB OTHER RESPIRATORY from 03/25/2020 in [facility] CARDIAC REHABILITATION  Date 01/29/20  Educator mk  Instruction Review Code 2- Demonstrated Understanding       Bronchial Hygiene / Breathing Techniques: - Discuss breathing mechanics, pursed-lip breathing technique,  proper posture, effective ways to clear airways, and other functional breathing techniques Flowsheet Row PULMONARY REHAB OTHER RESPIRATORY from 03/25/2020 in [place] [facility]  Date 02/05/20  Educator Hand out  Instruction Review Code 1- Verbalizes Understanding       Cleaning Equipment: - Provides group verbal and written instruction about the health risks of elevated stress, cause of high stress, and healthy ways to reduce stress. Flowsheet Row PULMONARY REHAB OTHER RESPIRATORY from  03/25/2020 in [place] [facility]  Date 02/12/20  Educator Handout  Instruction Review Code 1- Verbalizes Understanding       Nutrition I: Fats: - Discuss the types of cholesterol, what cholesterol does to the body, and how cholesterol levels can be controlled. Flowsheet Row PULMONARY REHAB OTHER RESPIRATORY from 03/25/2020 in [place] [facility]  Date 02/19/20  Educator Handout  Instruction Review Code 1- Verbalizes Understanding       Nutrition II: Labels: -Discuss the different components of food labels and how to read food labels. Flowsheet Row PULMONARY REHAB OTHER RESPIRATORY from 03/25/2020 in [place] [facility]  Date 02/26/20  Educator DJ  Instruction Review Code 1- Verbalizes Understanding       Respiratory Infections: - Discuss the signs and symptoms of respiratory infections, ways to prevent respiratory infections, and the importance of seeking medical treatment when having a respiratory infection. Flowsheet Row PULMONARY REHAB OTHER RESPIRATORY from 03/25/2020 in [place] [facility]  Date 03/04/20  Educator Handout  Instruction Review Code 1- Verbalizes Understanding       Stress I: Signs and Symptoms: - Discuss the causes of stress, how stress may lead to anxiety and depression, and ways to limit stress. Flowsheet Row PULMONARY REHAB OTHER RESPIRATORY from 03/25/2020 in [place] [facility]  Date 03/11/20  Educator [facility]  Instruction Review Code 1- Verbalizes Understanding       Stress II: Relaxation: -Discuss relaxation techniques to limit stress. Flowsheet Row PULMONARY REHAB OTHER RESPIRATORY from 03/25/2020 in [place] [facility]  Date 03/18/20  Educator [facility]  Instruction Review Code 1- Verbalizes Understanding       Oxygen for Home/Travel: - Discuss how to prepare for travel when on oxygen and proper ways to transport and store oxygen to ensure safety. Flowsheet Row  PULMONARY REHAB OTHER RESPIRATORY from 03/25/2020 in [place] [facility]  Date 03/25/20  Educator DF  Instruction Review Code 2- Demonstrated Understanding       Knowledge Questionnaire Score:  Knowledge Questionnaire Score - 07/18/22 1100       Knowledge Questionnaire Score   Pre Score 12/18             Core Components/Risk Factors/Patient Goals at Admission:  Personal Goals and Risk Factors at Admission - 07/18/22 1100       Core Components/Risk Factors/Patient Goals on Admission    Weight Management Yes;Weight Loss    Intervention Weight Management: Develop a combined nutrition and exercise program designed to reach desired caloric intake, while maintaining appropriate intake of nutrient and fiber, sodium and fats, and appropriate energy expenditure required for the weight goal.;Weight Management: Provide education  and appropriate resources to help participant work on and attain dietary goals.;Weight Management/Obesity: Establish reasonable short term and long term weight goals.    Admit Weight 172 lb 1.6 oz (78.1 kg)    Goal Weight: Short Term 170 lb (77.1 kg)    Goal Weight: Long Term 170 lb (77.1 kg)    Expected Outcomes Short Term: Continue to assess and modify interventions until short term weight is achieved;Long Term: Adherence to nutrition and physical activity/exercise program aimed toward attainment of established weight goal;Weight Loss: Understanding of general recommendations for a balanced deficit meal plan, which promotes 1-2 lb weight loss per week and includes a negative energy balance of [phone] kcal/d;Understanding recommendations for meals to include 15-35% energy as protein, 25-35% energy from fat, 35-60% energy from carbohydrates, less than 200mg  of dietary cholesterol, 20-35 gm of total fiber daily;Understanding of distribution of calorie intake throughout the day with the consumption of 4-5 meals/snacks    Tobacco Cessation Yes    Number of  packs per day 3 per day (1 pack per week)    Intervention Assist the participant in steps to quit. Provide individualized education and counseling about committing to Tobacco Cessation, relapse prevention, and pharmacological support that can be provided by physician.;Education officer, environmental, assist with locating and accessing local/national Quit Smoking programs, and support quit date choice.    Expected Outcomes Short Term: Will demonstrate readiness to quit, by selecting a quit date.;Short Term: Will quit all tobacco product use, adhering to prevention of relapse plan.;Long Term: Complete abstinence from all tobacco products for at least 12 months from quit date.    Improve shortness of breath with ADL's Yes    Intervention Provide education, individualized exercise plan and daily activity instruction to help decrease symptoms of SOB with activities of daily living.    Expected Outcomes Short Term: Improve cardiorespiratory fitness to achieve a reduction of symptoms when performing ADLs;Long Term: Be able to perform more ADLs without symptoms or delay the onset of symptoms    Increase knowledge of respiratory medications and ability to use respiratory devices properly  Yes    Intervention Provide education and demonstration as needed of appropriate use of medications, inhalers, and oxygen therapy.    Expected Outcomes Long Term: Maintain appropriate use of medications, inhalers, and oxygen therapy.;Short Term: Achieves understanding of medications use. Understands that oxygen is a medication prescribed by physician. Demonstrates appropriate use of inhaler and oxygen therapy.    Hypertension Yes    Intervention Provide education on lifestyle modifcations including regular physical activity/exercise, weight management, moderate sodium restriction and increased consumption of fresh fruit, vegetables, and low fat dairy, alcohol moderation, and smoking cessation.;Monitor prescription use compliance.     Expected Outcomes Long Term: Maintenance of blood pressure at goal levels.;Short Term: Continued assessment and intervention until BP is < 140/50mm HG in hypertensive participants. < 130/14mm HG in hypertensive participants with diabetes, heart failure or chronic kidney disease.    Lipids Yes    Intervention Provide education and support for participant on nutrition & aerobic/resistive exercise along with prescribed medications to achieve LDL 70mg , HDL >40mg .    Expected Outcomes Long Term: Cholesterol controlled with medications as prescribed, with individualized exercise RX and with personalized nutrition plan. Value goals: LDL < 70mg , HDL > 40 mg.;Short Term: Participant states understanding of desired cholesterol values and is compliant with medications prescribed. Participant is following exercise prescription and nutrition guidelines.             Core Components/Risk  Factors/Patient Goals Review:   Goals and Risk Factor Review     Row Name 07/18/22 1110             Core Components/Risk Factors/Patient Goals Review   Personal Goals Review Tobacco Cessation       Review Pt has worked from 1 ppd down to 3 cigarettes a day.  He would like to continue to work towards quitting.  He no longer takes them out of the house so is already making strides.  He was commended for his success already.       Expected Outcomes Short: Continue to work towards quitting completely  Long: Complete cessation                Core Components/Risk Factors/Patient Goals at Discharge (Final Review):   Goals and Risk Factor Review - 07/18/22 1110       Core Components/Risk Factors/Patient Goals Review   Personal Goals Review Tobacco Cessation    Review Pt has worked from 1 ppd down to 3 cigarettes a day.  He would like to continue to work towards quitting.  He no longer takes them out of the house so is already making strides.  He was commended for his success already.    Expected Outcomes Short:  Continue to work towards quitting completely  Long: Complete cessation             ITP Comments:  ITP Comments     Row Name 07/18/22 0941 07/19/22 1324         ITP Comments Patient attend orientation today.  Patient is attendingPulmonary Rehabilitation Program.  Documentation for diagnosis can be found in [place] notes 03/28/22 and 07/12/22.  Reviewed medical chart, RPE/RPD, gym safety, and program guidelines.  Patient was fitted to equipment they will be using during rehab.  Patient is scheduled to start exercise on 07/20/22 at 1045. 30 day review completed. ITP sent to Dr.Jehanzeb Memon, Medical Director of  Pulmonary Rehab. Continue with ITP unless changes are made by physician.  Pt is still brand new, has yet to start exercise.               Comments: 30 day review

## 2022-07-25 ENCOUNTER — Encounter (HOSPITAL_COMMUNITY)
Admission: RE | Admit: 2022-07-25 | Discharge: 2022-07-25 | Discharge status: Home / Self Care | Payer: No Typology Code available for payment source | Source: Ambulatory Visit | Attending: Registered Nurse | Admitting: Registered Nurse

## 2022-07-25 DIAGNOSIS — R0609 Other forms of dyspnea: Secondary | ICD-10-CM | POA: Diagnosis not present

## 2022-07-25 DIAGNOSIS — J449 Chronic obstructive pulmonary disease, unspecified: Secondary | ICD-10-CM

## 2022-07-25 NOTE — Progress Notes (Signed)
Daily Session Note  Patient Details  Name: Trevor Key MRN: [id number] Date of Birth: Jan 17, 1955 Referring Provider:   Flowsheet Row PULMONARY REHAB COPD ORIENTATION from 07/18/2022 in [facility] CARDIAC REHABILITATION  Referring Provider Trevor Berger, NP  [Dr. Pernell Key (attending MD)]       Encounter Date: 07/25/2022  Check In:  Session Check In - 07/25/22 1044       Check-In   Supervising physician immediately available to respond to emergencies See telemetry face sheet for immediately available MD    Location [facility]-Cardiac & Pul[facility] Rehab    Staff Present Trevor Key, BS, Exercise Physiologist;Trevor Juanetta Gosling, MA, RCEP, CCRP, CCET    Virtual Visit No    Medication changes reported     No    Fall or balance concerns reported    No    Tobacco Cessation No Change    Warm-up and Cool-down Performed on first and last piece of equipment    Resistance Training Performed Yes    VAD Patient? No    PAD/SET Patient? No      Pain Assessment   Currently in Pain? No/denies    Multiple Pain Sites No             Capillary Blood Glucose: No results found for this or any previous visit (from the past 24 hour(s)).    Social History   Tobacco Use  Smoking Status Every Day   Current packs/day: 0.25   Average packs/day: 0.3 packs/day for 40.0 years (10.0 ttl pk-yrs)   Types: Cigarettes  Smokeless Tobacco Never  Tobacco Comments   He is currently enrolled in a smoking cessation program through the [place]. He reports that it is working.    Goals Met:  Independence with exercise equipment Exercise tolerated well No report of concerns or symptoms today Strength training completed today  Goals Unmet:  Not Applicable  Comments: First full day of exercise!  Patient was oriented to gym and equipment including functions, settings, policies, and procedures.  Patient's individual exercise prescription and treatment plan were reviewed.  All starting workloads were established  based on the results of the 6 minute walk test done at initial orientation visit.  The plan for exercise progression was also introduced and progression will be customized based on patient's performance and goals.    Dr. Erick Key is Medical Director for [facility] Pulmonary Rehab.

## 2022-07-27 ENCOUNTER — Encounter (HOSPITAL_COMMUNITY)
Admission: RE | Admit: 2022-07-27 | Discharge: 2022-07-27 | Disposition: A | Discharge status: Home / Self Care | Payer: No Typology Code available for payment source | Source: Ambulatory Visit | Attending: Registered Nurse | Admitting: Registered Nurse

## 2022-07-27 DIAGNOSIS — R0609 Other forms of dyspnea: Secondary | ICD-10-CM

## 2022-07-27 NOTE — Progress Notes (Signed)
Daily Session Note  Patient Details  Name: Trevor Key MRN: [id number] Date of Birth: December 04, 1955 Referring Provider:   Flowsheet Row PULMONARY REHAB COPD ORIENTATION from 07/18/2022 in [facility] CARDIAC REHABILITATION  Referring Provider Vito Berger, NP  [Dr. Pernell Dupre (attending MD)]       Encounter Date: 07/27/2022  Check In:  Session Check In - 07/27/22 1045       Check-In   Supervising physician immediately available to respond to emergencies [facility] MD immediately available    Physician(s) Dr. Wyline Mood    Location [facility]-Card[facility] Pul[facility]    Staff Present Ross Ludwig, BS, Exercise Physiologist;Sowmya Partridge BSN, RN    Virtual Visit No    Medication changes reported     No    Fall or balance concerns reported    No    Tobacco Cessation No Change    Warm-up and Cool-down Performed on first and last piece of equipment    Resistance Training Performed Yes    VAD Patient? No    PAD/SET Patient? No      Pain Assessment   Currently in Pain? No/denies    Multiple Pain Sites No             Capillary Blood Glucose: No results found for this or any previous visit (from the past 24 hour(s)).    Social History   Tobacco Use  Smoking Status Every Day   Current packs/day: 0.25   Average packs/day: 0.3 packs/day for 40.0 years (10.0 ttl pk-yrs)   Types: Cigarettes  Smokeless Tobacco Never  Tobacco Comments   He is currently enrolled in a smoking cessation program through the [place]. He reports that it is working.    Goals Met:  Proper associated with RPD/PD & O2 Sat Independence with exercise equipment Using PLB without cueing & demonstrates good technique Exercise tolerated well Queuing for purse lip breathing No report of concerns or symptoms today Strength training completed today  Goals Unmet:  Not Applicable  Comments: Pt able to follow exercise prescription today without complaint.  Will continue to monitor for progression.    Dr. Erick Blinks is Medical Director for [facility] Pulmonary Rehab.

## 2022-08-01 ENCOUNTER — Encounter (HOSPITAL_COMMUNITY)
Admission: RE | Admit: 2022-08-01 | Discharge: 2022-08-01 | Disposition: A | Discharge status: Home / Self Care | Payer: No Typology Code available for payment source | Source: Ambulatory Visit | Attending: Registered Nurse | Admitting: Registered Nurse

## 2022-08-01 DIAGNOSIS — J449 Chronic obstructive pulmonary disease, unspecified: Secondary | ICD-10-CM

## 2022-08-01 DIAGNOSIS — R0609 Other forms of dyspnea: Secondary | ICD-10-CM

## 2022-08-01 NOTE — Progress Notes (Signed)
Daily Session Note  Patient Details  Name: Trevor Key MRN: [id number] Date of Birth: 02/07/1955 Referring Provider:   Flowsheet Row PULMONARY REHAB COPD ORIENTATION from 07/18/2022 in [facility] CARDIAC REHABILITATION  Referring Provider Vito Berger, NP  [Dr. Pernell Dupre (attending MD)]       Encounter Date: 08/01/2022  Check In:  Session Check In - 08/01/22 1045       Check-In   Supervising physician immediately available to respond to emergencies See telemetry face sheet for immediately available MD    Location [facility] Pul[facility] Re[facility]    Staff Present Ross Ludwig, BS, Exercise Physiologist;Jessica Juanetta Gosling, MA, RCEP, CCRP, Harolyn Rutherford, RN, BSN    Virtual Visit No    Medication changes reported     No    Fall or balance concerns reported    No    Tobacco Cessation No Change    Warm-up and Cool-down Performed on first and last piece of equipment    Resistance Training Performed Yes    VAD Patient? No      Pain Assessment   Currently in Pain? No/denies             Capillary Blood Glucose: No results found for this or any previous visit (from the past 24 hour(s)).    Social History   Tobacco Use  Smoking Status Every Day   Current packs/day: 0.25   Average packs/day: 0.3 packs/day for 40.0 years (10.0 ttl pk-yrs)   Types: Cigarettes  Smokeless Tobacco Never  Tobacco Comments   He is currently enrolled in a smoking cessation program through the [place]. He reports that it is working.    Goals Met:  Proper associated with RPD/PD & O2 Sat Independence with exercise equipment Using PLB without cueing & demonstrates good technique Exercise tolerated well Queuing for purse lip breathing No report of concerns or symptoms today Strength training completed today  Goals Unmet:  Not Applicable  Comments: Pt able to follow exercise prescription today without complaint.  Will continue to monitor for progression.    Dr. Erick Blinks is Medical  Director for [facility] Pulmonary Rehab.

## 2022-08-03 ENCOUNTER — Encounter (HOSPITAL_COMMUNITY)
Admission: RE | Admit: 2022-08-03 | Discharge: 2022-08-03 | Disposition: A | Discharge status: Home / Self Care | Payer: No Typology Code available for payment source | Source: Ambulatory Visit | Attending: Registered Nurse | Admitting: Registered Nurse

## 2022-08-03 DIAGNOSIS — J449 Chronic obstructive pulmonary disease, unspecified: Secondary | ICD-10-CM | POA: Insufficient documentation

## 2022-08-03 DIAGNOSIS — R0609 Other forms of dyspnea: Secondary | ICD-10-CM | POA: Diagnosis present

## 2022-08-03 NOTE — Progress Notes (Signed)
Daily Session Note  Patient Details  Name: Trevor Key MRN: [id number] Date of Birth: 1955-07-25 Referring Provider:   Flowsheet Row PULMONARY REHAB COPD ORIENTATION from 07/18/2022 in [facility] CARDIAC REHABILITATION  Referring Provider Vito Berger, NP  [Dr. Pernell Dupre (attending MD)]       Encounter Date: 08/03/2022  Check In:  Session Check In - 08/03/22 1045       Check-In   Supervising physician immediately available to respond to emergencies [facility] MD immediately available    Physician(s) Dr. Wyline Mood    Location [facility] Pul[facility]    Staff Present Ross Ludwig, BS, Exercise Physiologist;Camora Tremain BSN, RN    Virtual Visit No    Medication changes reported     No    Fall or balance concerns reported    No    Tobacco Cessation No Change    Warm-up and Cool-down Performed on first and last piece of equipment    Resistance Training Performed Yes    VAD Patient? No    PAD/SET Patient? No      Pain Assessment   Currently in Pain? No/denies    Multiple Pain Sites No             Capillary Blood Glucose: No results found for this or any previous visit (from the past 24 hour(s)).    Social History   Tobacco Use  Smoking Status Every Day   Current packs/day: 0.25   Average packs/day: 0.3 packs/day for 40.0 years (10.0 ttl pk-yrs)   Types: Cigarettes  Smokeless Tobacco Never  Tobacco Comments   He is currently enrolled in a smoking cessation program through the [place]. He reports that it is working.    Goals Met:  Proper associated with RPD/PD & O2 Sat Independence with exercise equipment Using PLB without cueing & demonstrates good technique Exercise tolerated well Queuing for purse lip breathing No report of concerns or symptoms today Strength training completed today  Goals Unmet:  Not Applicable  Comments: Marland KitchenMarland KitchenPt able to follow exercise prescription today without complaint.  Will continue to monitor for progression.    Dr.  Erick Blinks is Medical Director for [facility] Pulmonary Rehab.

## 2022-08-08 ENCOUNTER — Encounter (HOSPITAL_COMMUNITY): Payer: No Typology Code available for payment source

## 2022-08-10 ENCOUNTER — Encounter (HOSPITAL_COMMUNITY)
Admission: RE | Admit: 2022-08-10 | Discharge: 2022-08-10 | Disposition: A | Discharge status: Home / Self Care | Payer: No Typology Code available for payment source | Source: Ambulatory Visit | Attending: Registered Nurse | Admitting: Registered Nurse

## 2022-08-10 DIAGNOSIS — R0609 Other forms of dyspnea: Secondary | ICD-10-CM | POA: Diagnosis not present

## 2022-08-10 NOTE — Progress Notes (Signed)
Daily Session Note  Patient Details  Name: GERRELL SENNA MRN: [id number] Date of Birth: 04-17-1955 Referring Provider:   Flowsheet Row PULMONARY REHAB COPD ORIENTATION from 07/18/2022 in [facility] CARDIAC REHABILITATION  Referring Provider Vito Berger, NP  [Dr. Pernell Dupre (attending MD)]       Encounter Date: 08/10/2022  Check In:  Session Check In - 08/10/22 1030       Check-In   Supervising physician immediately available to respond to emergencies See telemetry face sheet for immediately available ER MD    Location [facility] Pul[facility] Re[facility]    Staff Present Fabio Pierce, MA, RCEP, CCRP, Dow Adolph, RN, Pleas Koch, RN, BSN    Virtual Visit No    Medication changes reported     No    Fall or balance concerns reported    No    Warm-up and Cool-down Performed on first and last piece of equipment    Resistance Training Performed Yes    VAD Patient? No    PAD/SET Patient? No      Pain Assessment   Currently in Pain? No/denies    Multiple Pain Sites No             Capillary Blood Glucose: No results found for this or any previous visit (from the past 24 hour(s)).    Social History   Tobacco Use  Smoking Status Every Day   Current packs/day: 0.25   Average packs/day: 0.3 packs/day for 40.0 years (10.0 ttl pk-yrs)   Types: Cigarettes  Smokeless Tobacco Never  Tobacco Comments   He is currently enrolled in a smoking cessation program through the [place]. He reports that it is working.    Goals Met:  Proper associated with RPD/PD & O2 Sat Independence with exercise equipment Using PLB without cueing & demonstrates good technique Exercise tolerated well No report of concerns or symptoms today Strength training completed today  Goals Unmet:  Not Applicable  Comments: Check out 1200.   Dr. Erick Blinks is Medical Director for [facility] Pulmonary Rehab.

## 2022-08-15 ENCOUNTER — Encounter (HOSPITAL_COMMUNITY)
Admission: RE | Admit: 2022-08-15 | Discharge: 2022-08-15 | Disposition: A | Discharge status: Home / Self Care | Payer: No Typology Code available for payment source | Source: Ambulatory Visit | Attending: Registered Nurse | Admitting: Registered Nurse

## 2022-08-15 DIAGNOSIS — R0609 Other forms of dyspnea: Secondary | ICD-10-CM | POA: Diagnosis not present

## 2022-08-15 DIAGNOSIS — J449 Chronic obstructive pulmonary disease, unspecified: Secondary | ICD-10-CM

## 2022-08-15 NOTE — Progress Notes (Signed)
Daily Session Note  Patient Details  Name: Trevor Key MRN: [id number] Date of Birth: 06/29/1955 Referring Provider:   Flowsheet Row PULMONARY REHAB COPD ORIENTATION from 07/18/2022 in [facility] CARDIAC REHABILITATION  Referring Provider Vito Berger, NP  [Dr. Pernell Dupre (attending MD)]       Encounter Date: 08/15/2022  Check In:  Session Check In - 08/15/22 1045       Check-In   Supervising physician immediately available to respond to emergencies See telemetry face sheet for immediately available MD    Physician(s) Dr. Wyline Mood    Location [facility] Pul[facility] Re[facility]    Staff Present Ross Ludwig, BS, Exercise Physiologist; , RN;Daphyne Daphine Deutscher, RN, BSN;Jessica Hawkins, MA, RCEP, CCRP, CCET    Virtual Visit No    Medication changes reported     No    Fall or balance concerns reported    No    Tobacco Cessation No Change    Current number of cigarettes/nicotine per day     3    Warm-up and Cool-down Performed on first and last piece of equipment    Resistance Training Performed Yes    VAD Patient? No    PAD/SET Patient? No      Pain Assessment   Currently in Pain? No/denies    Multiple Pain Sites No             Capillary Blood Glucose: No results found for this or any previous visit (from the past 24 hour(s)).    Social History   Tobacco Use  Smoking Status Every Day   Current packs/day: 0.25   Average packs/day: 0.3 packs/day for 40.0 years (10.0 ttl pk-yrs)   Types: Cigarettes  Smokeless Tobacco Never  Tobacco Comments   He is currently enrolled in a smoking cessation program through the [place]. He reports that it is working.    Goals Met:  Proper associated with RPD/PD & O2 Sat Independence with exercise equipment Using PLB without cueing & demonstrates good technique Exercise tolerated well No report of concerns or symptoms today Strength training completed today  Goals Unmet:  Not Applicable  Comments: Pt able to follow  exercise prescription today without complaint.  Will continue to monitor for progression.    Dr. Erick Blinks is Medical Director for [facility] Pulmonary Rehab.

## 2022-08-16 ENCOUNTER — Encounter (HOSPITAL_COMMUNITY): Payer: Self-pay | Admitting: *Deleted

## 2022-08-16 DIAGNOSIS — R0609 Other forms of dyspnea: Secondary | ICD-10-CM

## 2022-08-16 NOTE — Progress Notes (Signed)
Pulmonary Individual Treatment Plan  Patient Details  Name: Trevor Key MRN: [id number] Date of Birth: [birth year] Referring Provider:   Flowsheet Row PULMONARY REHAB COPD ORIENTATION from 07/18/2022 in [facility] CARDIAC REHABILITATION  Referring Provider Vito Berger, NP  [Dr. Pernell Dupre (attending MD)]       Initial Encounter Date:  Flowsheet Row PULMONARY REHAB COPD ORIENTATION from 07/18/2022 in [place] [facility]  Date 07/18/22       Visit Diagnosis: Dyspnea on exertion  Patient's Home Medications on Admission:   Current Outpatient Medications:    albuterol (PROVENTIL HFA;VENTOLIN HFA) 108 (90 BASE) MCG/ACT inhaler, Inhale 1-2 puffs into the lungs every 6 (six) hours as needed for wheezing., Disp: 1 Inhaler, Rfl: 0   aspirin 81 MG chewable tablet, Chew 81 mg by mouth daily., Disp: , Rfl:    atorvastatin (LIPITOR) 80 MG tablet, Take 80 mg by mouth daily., Disp: , Rfl:    cholecalciferol (VITAMIN D3) 25 MCG (1000 UNIT) tablet, Take 1,000 Units by mouth daily., Disp: , Rfl:    losartan (COZAAR) 100 MG tablet, Take 100 mg by mouth daily., Disp: , Rfl:    metoprolol succinate (TOPROL-XL) 50 MG 24 hr tablet, Take 50 mg by mouth daily. Take with or immediately following a meal., Disp: , Rfl:    nitroGLYCERIN (NITROLINGUAL) 0.4 MG/SPRAY spray, Place 1 spray under the tongue every 5 (five) minutes as needed. For chest pain., Disp: , Rfl:    omeprazole (PRILOSEC) 20 MG capsule, Take 20 mg by mouth daily., Disp: , Rfl:    primidone (MYSOLINE) 50 MG tablet, Take 50 mg by mouth daily., Disp: , Rfl:    tiotropium (SPIRIVA) 18 MCG inhalation capsule, Place 18 mcg into inhaler and inhale daily., Disp: , Rfl:   Past Medical History: Past Medical History:  Diagnosis Date   Coronary atherosclerosis of native coronary artery    BMS LAD 1/13, LVEF 55-60%   Hypercholesteremia    Hypertension    MI (myocardial infarction) (HCC)    AMI 1/13    Tobacco Use: Social  History   Tobacco Use  Smoking Status Every Day   Current packs/day: 0.25   Average packs/day: 0.3 packs/day for 40.0 years (10.0 ttl pk-yrs)   Types: Cigarettes  Smokeless Tobacco Never  Tobacco Comments   He is currently enrolled in a smoking cessation program through the [place]. He reports that it is working.    Labs: Review Flowsheet       Latest Ref Rng & Units 03/27/2011 10/28/2011 05/19/2016 05/20/2016  Labs for ITP Cardiac and Pulmonary Rehab  Cholestrol 0 - 200 mg/dL 086  - - 578   LDL (calc) 0 - 99 mg/dL 66  - - 71   HDL-C >46 mg/dL 33  - - 33   Trlycerides <150 mg/dL 96  - - 93   Hemoglobin A1c 4.8 - 5.6 % - - - 5.9   TCO2 0 - 100 mmol/L - 26  28  -    Details            Capillary Blood Glucose: No results found for: "GLUCAP"   Pulmonary Assessment Scores:  Pulmonary Assessment Scores     Row Name 07/18/22 1123         ADL UCSD   ADL Phase Entry     SOB Score total 87     Rest 0     Walk 1     Stairs 5     Bath 3  Dress 3     Shop 5       CAT Score   CAT Score 22       mMRC Score   mMRC Score 1             UCSD: Self-administered rating of dyspnea associated with activities of daily living (ADLs) 6-point scale (0 = "not at all" to 5 = "maximal or unable to do because of breathlessness")  Scoring Scores range from 0 to 120.  Minimally important difference is 5 units  CAT: CAT can identify the health impairment of COPD patients and is better correlated with disease progression.  CAT has a scoring range of zero to 40. The CAT score is classified into four groups of low (less than 10), medium (10 - 20), high (21-30) and very high (31-40) based on the impact level of disease on health status. A CAT score over 10 suggests significant symptoms.  A worsening CAT score could be explained by an exacerbation, poor medication adherence, poor inhaler technique, or progression of COPD or comorbid conditions.  CAT MCID is 2 points  mMRC: mMRC  (Modified Medical Research Council) Dyspnea Scale is used to assess the degree of baseline functional disability in patients of respiratory disease due to dyspnea. No minimal important difference is established. A decrease in score of 1 point or greater is considered a positive change.   Pulmonary Function Assessment:  Pulmonary Function Assessment - 07/18/22 1126       Breath   Shortness of Breath Yes;Limiting activity;Fear of Shortness of Breath             Exercise Target Goals: Exercise Program Goal: Individual exercise prescription set using results from initial 6 min walk test and THRR while considering  patient's activity barriers and safety.   Exercise Prescription Goal: Initial exercise prescription builds to 30-45 minutes a day of aerobic activity, 2-3 days per week.  Home exercise guidelines will be given to patient during program as part of exercise prescription that the participant will acknowledge.  Activity Barriers & Risk Stratification:  Activity Barriers & Cardiac Risk Stratification - 07/18/22 0756       Activity Barriers & Cardiac Risk Stratification   Activity Barriers Arthritis;Joint Problems;History of Falls   R shoulder bothersome            6 Minute Walk:  6 Minute Walk     Row Name 07/18/22 0944         6 Minute Walk   Phase Initial     Distance 1374 feet     Walk Time 6 minutes     # of Rest Breaks 0     MPH 2.6     METS 3.28     RPE 12     Perceived Dyspnea  3     VO2 Peak 11.49     Symptoms Yes (comment)     Comments SOB, chest pain 3/10 at end     Resting HR 61 bpm     Resting BP 146/74     Resting Oxygen Saturation  97 %     Exercise Oxygen Saturation  during 6 min walk 95 %     Max Ex. HR 89 bpm     Max Ex. BP 176/64     2 Minute Post BP 132/64       Interval HR   1 Minute HR 65     2 Minute HR --  error  3 Minute HR --  error     4 Minute HR --  error     5 Minute HR --  error     6 Minute HR 89     2 Minute  Post HR 69     Interval Heart Rate? Yes       Interval Oxygen   Interval Oxygen? Yes     Baseline Oxygen Saturation % 97 %     1 Minute Oxygen Saturation % 96 %     1 Minute Liters of Oxygen 0 L  Room Air     2 Minute Oxygen Saturation % 95 %     2 Minute Liters of Oxygen 0 L     3 Minute Oxygen Saturation % 95 %     3 Minute Liters of Oxygen 0 L     4 Minute Oxygen Saturation % 95 %     4 Minute Liters of Oxygen 0 L     5 Minute Oxygen Saturation % 95 %     5 Minute Liters of Oxygen 0 L     6 Minute Oxygen Saturation % 95 %     6 Minute Liters of Oxygen 0 L     2 Minute Post Oxygen Saturation % 96 %     2 Minute Post Liters of Oxygen 0 L              Oxygen Initial Assessment:  Oxygen Initial Assessment - 07/18/22 1121       Home Oxygen   Home Oxygen Device None    Sleep Oxygen Prescription CPAP    Liters per minute 0    Home Exercise Oxygen Prescription None    Home Resting Oxygen Prescription None    Compliance with Home Oxygen Use No   does not put back on after getting up to go to bathroom     Initial 6 min Walk   Oxygen Used None      Program Oxygen Prescription   Program Oxygen Prescription None      Intervention   Short Term Goals To learn and exhibit compliance with exercise, home and travel O2 prescription;To learn and understand importance of monitoring SPO2 with pulse oximeter and demonstrate accurate use of the pulse oximeter.;To learn and understand importance of maintaining oxygen saturations>88%;To learn and demonstrate proper pursed lip breathing techniques or other breathing techniques. ;To learn and demonstrate proper use of respiratory medications    Long  Term Goals Exhibits compliance with exercise, home  and travel O2 prescription;Verbalizes importance of monitoring SPO2 with pulse oximeter and return demonstration;Maintenance of O2 saturations>88%;Exhibits proper breathing techniques, such as pursed lip breathing or other method taught during  program session;Compliance with respiratory medication;Demonstrates proper use of MDI's             Oxygen Re-Evaluation:  Oxygen Re-Evaluation     Row Name 07/18/22 1122 08/03/22 1127 08/04/22 1232         Program Oxygen Prescription   Program Oxygen Prescription -- None --       Home Oxygen   Home Oxygen Device -- None --     Sleep Oxygen Prescription -- CPAP --     Liters per minute -- --  pt is unsure of the oxygen concentration with his CPAP --     Home Exercise Oxygen Prescription -- None --     Home Resting Oxygen Prescription -- None --     Compliance with Home Oxygen Use --  No  pt does not put his CPAP back on after he gets up at night to go to the bathroom --       Goals/Expected Outcomes   Short Term Goals To learn and demonstrate proper pursed lip breathing techniques or other breathing techniques. ;To learn and understand importance of maintaining oxygen saturations>88%;To learn and understand importance of monitoring SPO2 with pulse oximeter and demonstrate accurate use of the pulse oximeter. To learn and understand importance of maintaining oxygen saturations>88%;To learn and demonstrate proper pursed lip breathing techniques or other breathing techniques. ;To learn and demonstrate proper use of respiratory medications;To learn and understand importance of monitoring SPO2 with pulse oximeter and demonstrate accurate use of the pulse oximeter. To learn and demonstrate proper pursed lip breathing techniques or other breathing techniques.      Long  Term Goals Exhibits proper breathing techniques, such as pursed lip breathing or other method taught during program session;Maintenance of O2 saturations>88%;Verbalizes importance of monitoring SPO2 with pulse oximeter and return demonstration Exhibits proper breathing techniques, such as pursed lip breathing or other method taught during program session;Maintenance of O2 saturations>88%;Verbalizes importance of monitoring SPO2  with pulse oximeter and return demonstration;Compliance with respiratory medication Exhibits proper breathing techniques, such as pursed lip breathing or other method taught during program session     Comments Reviewed PLB technique with pt.  Talked about how it works and it's importance in maintaining their exercise saturations. Pt is using PLB at times and using his inhalers as prescribed. Diaphragmatic and PLB breathing explained and performed with patient. Patient has a better understanding of how to do these exercises to help with breathing performance and relaxation. Patient performed breathing techniques adequately and to practice further at home.     Goals/Expected Outcomes Short: Become more profiecient at using PLB.   Long: Become independent at using PLB. Short term:  Become more proficient at using PLB and inquire about a home pulse ox  Long term:  Continue compliance with resp medications Short: practice PLB and diaphragmatic breathing at home. Long: Use PLB and diaphragmatic breathing independently              Oxygen Discharge (Final Oxygen Re-Evaluation):  Oxygen Re-Evaluation - 08/04/22 1232       Goals/Expected Outcomes   Short Term Goals To learn and demonstrate proper pursed lip breathing techniques or other breathing techniques.     Long  Term Goals Exhibits proper breathing techniques, such as pursed lip breathing or other method taught during program session    Comments Diaphragmatic and PLB breathing explained and performed with patient. Patient has a better understanding of how to do these exercises to help with breathing performance and relaxation. Patient performed breathing techniques adequately and to practice further at home.    Goals/Expected Outcomes Short: practice PLB and diaphragmatic breathing at home. Long: Use PLB and diaphragmatic breathing independently             Initial Exercise Prescription:  Initial Exercise Prescription - 07/18/22 0900        Date of Initial Exercise RX and Referring Provider   Date 07/18/22    Referring Provider Vito Berger, NP   Dr. Pernell Dupre (attending MD)     Oxygen   Maintain Oxygen Saturation 88% or higher      Treadmill   MPH 2.5    Grade 0.5    Minutes 15    METs 3.09      REL-XR   Level 3  Speed 50    Minutes 15    METs 3.2      Prescription Details   Frequency (times per week) 2    Duration Progress to 30 minutes of continuous aerobic without signs/symptoms of physical distress      Intensity   THRR 40-80% of Max Heartrate 98-135    Ratings of Perceived Exertion 11-13    Perceived Dyspnea 0-4      Progression   Progression Continue to progress workloads to maintain intensity without signs/symptoms of physical distress.      Resistance Training   Training Prescription Yes    Weight 5 lb    Reps 10-15             Perform Capillary Blood Glucose checks as needed.  Exercise Prescription Changes:   Exercise Prescription Changes     Row Name 07/18/22 0900 08/01/22 1200           Response to Exercise   Blood Pressure (Admit) 146/74 138/70      Blood Pressure (Exercise) 176/64 144/80      Blood Pressure (Exit) 132/64 134/62      Heart Rate (Admit) 61 bpm 62 bpm      Heart Rate (Exercise) 89 bpm 87 bpm      Heart Rate (Exit) 72 bpm 56 bpm      Oxygen Saturation (Admit) 97 % 97 %      Oxygen Saturation (Exercise) 95 % 99 %      Oxygen Saturation (Exit) 97 % 98 %      Rating of Perceived Exertion (Exercise) 12 12      Perceived Dyspnea (Exercise) 3 2      Symptoms SOB, chest pain (3/10) --      Comments walk test results --      Duration -- Continue with 30 min of aerobic exercise without signs/symptoms of physical distress.      Intensity -- THRR unchanged        Progression   Progression -- Continue to progress workloads to maintain intensity without signs/symptoms of physical distress.        Resistance Training   Training Prescription -- Yes       Weight -- 5      Reps -- 10-15        Treadmill   MPH -- 2.5      Grade -- 0.5      Minutes -- 15      METs -- 3.09        REL-XR   Level -- 1      Speed -- 44      Minutes -- 15      METs -- 2.3        Oxygen   Maintain Oxygen Saturation -- 88% or higher               Exercise Comments:   Exercise Goals and Review:   Exercise Goals     Row Name 07/18/22 0955             Exercise Goals   Increase Physical Activity Yes       Intervention Provide advice, education, support and counseling about physical activity/exercise needs.;Develop an individualized exercise prescription for aerobic and resistive training based on initial evaluation findings, risk stratification, comorbidities and participant's personal goals.       Expected Outcomes Short Term: Attend rehab on a regular basis to increase amount of physical activity.;Long Term: Add in home  exercise to make exercise part of routine and to increase amount of physical activity.;Long Term: Exercising regularly at least 3-5 days a week.       Increase Strength and Stamina Yes       Intervention Provide advice, education, support and counseling about physical activity/exercise needs.;Develop an individualized exercise prescription for aerobic and resistive training based on initial evaluation findings, risk stratification, comorbidities and participant's personal goals.       Expected Outcomes Short Term: Increase workloads from initial exercise prescription for resistance, speed, and METs.;Short Term: Perform resistance training exercises routinely during rehab and add in resistance training at home;Long Term: Improve cardiorespiratory fitness, muscular endurance and strength as measured by increased METs and functional capacity ( )       Able to understand and use rate of perceived exertion (RPE) scale Yes       Intervention Provide education and explanation on how to use RPE scale       Expected Outcomes Short Term:  Able to use RPE daily in rehab to express subjective intensity level;Long Term:  Able to use RPE to guide intensity level when exercising independently       Able to understand and use Dyspnea scale Yes       Intervention Provide education and explanation on how to use Dyspnea scale       Expected Outcomes Short Term: Able to use Dyspnea scale daily in rehab to express subjective sense of shortness of breath during exertion;Long Term: Able to use Dyspnea scale to guide intensity level when exercising independently       Knowledge and understanding of Target Heart Rate Range (THRR) Yes       Intervention Provide education and explanation of THRR including how the numbers were predicted and where they are located for reference       Expected Outcomes Short Term: Able to state/look up THRR;Short Term: Able to use daily as guideline for intensity in rehab;Long Term: Able to use THRR to govern intensity when exercising independently       Able to check pulse independently Yes       Intervention Provide education and demonstration on how to check pulse in carotid and radial arteries.;Review the importance of being able to check your own pulse for safety during independent exercise       Expected Outcomes Short Term: Able to explain why pulse checking is important during independent exercise;Long Term: Able to check pulse independently and accurately       Understanding of Exercise Prescription Yes       Intervention Provide education, explanation, and written materials on patient's individual exercise prescription       Expected Outcomes Short Term: Able to explain program exercise prescription;Long Term: Able to explain home exercise prescription to exercise independently                Exercise Goals Re-Evaluation :  Exercise Goals Re-Evaluation     Row Name 07/18/22 1033 08/01/22 1256 08/03/22 1055         Exercise Goal Re-Evaluation   Exercise Goals Review Able to understand and use rate of  perceived exertion (RPE) scale;Able to understand and use Dyspnea scale;Understanding of Exercise Prescription -- Increase Physical Activity;Increase Strength and Stamina;Understanding of Exercise Prescription     Comments Reviewed RPE  and dyspnea scale and program prescription with pt today.  Pt voiced understanding and was given a copy of goals to take home. Pt has just started rehab and has not  increased his workloads yet. He is exercising at an RPE of 12 on both stations Pt states that some days he feels a little stronger since he started the program.  He has been able to go up on his intensity during the workouts in rehab.  He has been walking everyday, sometimes 2-3 times daily at home.     Expected Outcomes Short: Use RPE daily to regulate intensity.  Long: Follow program prescription -- Short term:  go over home exercise routine  Long term:  Go up on his levels on the TM and elliptical in the program              Discharge Exercise Prescription (Final Exercise Prescription Changes):  Exercise Prescription Changes - 08/01/22 1200       Response to Exercise   Blood Pressure (Admit) 138/70    Blood Pressure (Exercise) 144/80    Blood Pressure (Exit) 134/62    Heart Rate (Admit) 62 bpm    Heart Rate (Exercise) 87 bpm    Heart Rate (Exit) 56 bpm    Oxygen Saturation (Admit) 97 %    Oxygen Saturation (Exercise) 99 %    Oxygen Saturation (Exit) 98 %    Rating of Perceived Exertion (Exercise) 12    Perceived Dyspnea (Exercise) 2    Duration Continue with 30 min of aerobic exercise without signs/symptoms of physical distress.    Intensity THRR unchanged      Progression   Progression Continue to progress workloads to maintain intensity without signs/symptoms of physical distress.      Resistance Training   Training Prescription Yes    Weight 5    Reps 10-15      Treadmill   MPH 2.5    Grade 0.5    Minutes 15    METs 3.09      REL-XR   Level 1    Speed 44    Minutes 15     METs 2.3      Oxygen   Maintain Oxygen Saturation 88% or higher             Nutrition:  Target Goals: Understanding of nutrition guidelines, daily intake of sodium 1500mg , cholesterol 200mg , calories 30% from fat and 7% or less from saturated fats, daily to have 5 or more servings of fruits and vegetables.  Biometrics:  Pre Biometrics - 07/18/22 1033       Pre Biometrics   Height 5\' 5"  (1.651 m)    Weight 172 lb 1.6 oz (78.1 kg)    Waist Circumference 34 inches    Hip Circumference 38 inches    Waist to Hip Ratio 0.89 %    BMI (Calculated) 28.64    Triceps Skinfold 15 mm    % Body Fat 25.4 %    Grip Strength 31.6 kg    Single Leg Stand 12.7 seconds              Nutrition Therapy Plan and Nutrition Goals:  Nutrition Therapy & Goals - 07/18/22 0755       Intervention Plan   Intervention Prescribe, educate and counsel regarding individualized specific dietary modifications aiming towards targeted core components such as weight, hypertension, lipid management, diabetes, heart failure and other comorbidities.;Nutrition handout(s) given to patient.    Expected Outcomes Short Term Goal: Understand basic principles of dietary content, such as calories, fat, sodium, cholesterol and nutrients.             Nutrition Assessments:  Nutrition Assessments -  07/18/22 1052       MEDFICTS Scores   Pre Score 116            MEDIFICTS Score Key: ?70 Need to make dietary changes  40-70 Heart Healthy Diet ? 40 Therapeutic Level Cholesterol Diet   Picture Your Plate Scores: <16 Unhealthy dietary pattern with much room for improvement. 41-50 Dietary pattern unlikely to meet recommendations for good health and room for improvement. 51-60 More healthful dietary pattern, with some room for improvement.  >60 Healthy dietary pattern, although there may be some specific behaviors that could be improved.    Nutrition Goals Re-Evaluation:  Nutrition Goals  Re-Evaluation     Row Name 08/03/22 1102             Goals   Nutrition Goal Heart healthy diet with plenty of protein       Comment The pt loves fried foods and eats a lot of red meat, but he also tries to stay away from sweets and salty foods.  He has a good support system at home with his family members trying to help the pt try healthier foods.       Expected Outcome Short term:  Pt will try to eat more grilled chicken vs red meat Long term:  Try to work on healthier eating habits                Nutrition Goals Discharge (Final Nutrition Goals Re-Evaluation):  Nutrition Goals Re-Evaluation - 08/03/22 1102       Goals   Nutrition Goal Heart healthy diet with plenty of protein    Comment The pt loves fried foods and eats a lot of red meat, but he also tries to stay away from sweets and salty foods.  He has a good support system at home with his family members trying to help the pt try healthier foods.    Expected Outcome Short term:  Pt will try to eat more grilled chicken vs red meat Long term:  Try to work on healthier eating habits             Psychosocial: Target Goals: Acknowledge presence or absence of significant depression and/or stress, maximize coping skills, provide positive support system. Participant is able to verbalize types and ability to use techniques and skills needed for reducing stress and depression.  Initial Review & Psychosocial Screening:  Initial Psych Review & Screening - 07/18/22 0753       Initial Review   Current issues with Current Stress Concerns;None Identified;Current Sleep Concerns    Source of Stress Concerns Chronic Illness    Comments only sleeps 3-4 hrs a night (chronic issue), SOB is limiting his ability to do things      Family Dynamics   Good Support System? Yes   wife and kids that come by to check weekly     Barriers   Psychosocial barriers to participate in program Psychosocial barriers identified (see note);The patient  should benefit from training in stress management and relaxation.      Screening Interventions   Interventions Encouraged to exercise;Provide feedback about the scores to participant    Expected Outcomes Short Term goal: Utilizing psychosocial counselor, staff and physician to assist with identification of specific Stressors or current issues interfering with healing process. Setting desired goal for each stressor or current issue identified.;Long Term Goal: Stressors or current issues are controlled or eliminated.;Short Term goal: Identification and review with participant of any Quality of Life or Depression  concerns found by scoring the questionnaire.;Long Term goal: The participant improves quality of Life and PHQ9 Scores as seen by post scores and/or verbalization of changes             Quality of Life Scores:  Quality of Life - 07/18/22 1052       Quality of Life   Select Quality of Life      Quality of Life Scores   Health/Function Pre 21.38 %    Socioeconomic Pre 29.14 %    Psych/Spiritual Pre 30 %    Family Pre 24 %    GLOBAL Pre 25.03 %            Scores of 19 and below usually indicate a poorer quality of life in these areas.  A difference of  2-3 points is a clinically meaningful difference.  A difference of 2-3 points in the total score of the Quality of Life Index has been associated with significant improvement in overall quality of life, self-image, physical symptoms, and general health in studies assessing change in quality of life.   PHQ-9: Review Flowsheet       07/18/2022 01/09/2020 02/07/2011  Depression screen PHQ 2/9  Decreased Interest 0 0 0  Down, Depressed, Hopeless 0 0 0  PHQ - 2 Score 0 0 0  Altered sleeping 0 1 -  Tired, decreased energy 3 1 -  Change in appetite 0 0 -  Feeling bad or failure about yourself  0 0 -  Trouble concentrating 0 0 -  Moving slowly or fidgety/restless 0 0 -  Suicidal thoughts 0 0 -  PHQ-9 Score 3 2 -  Difficult doing  work/chores Not difficult at all Not difficult at all -    Details           Interpretation of Total Score  Total Score Depression Severity:  1-4 = Minimal depression, 5-9 = Mild depression, 10-14 = Moderate depression, 15-19 = Moderately severe depression, 20-27 = Severe depression   Psychosocial Evaluation and Intervention:  Psychosocial Evaluation - 07/18/22 1039       Psychosocial Evaluation & Interventions   Interventions Relaxation education;Encouraged to exercise with the program and follow exercise prescription    Comments Elian is coming into Pulmonary Rehab via the [place].  He requested to return to [place] as he has done program before and found in beneficial.  He used to help and support local high school, but has reitred from school as of last fall and has found that not being as active he is now feeling increased fatigue and DOE more than he has ever before.  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  He really wants to attend rehab to get moving again and to breathe better.  He also wants to boost his stamina and energy level so that he feels more like himself again.  He still goes for a walk most days but then finds he needs to rest afterwards.  His daughter is a Systems analyst and she like all his kids are constantly on him about moving more.  He lives with his wife who is still working as well.  His kids all live near by and drop by to check in and call frequently.  He does not have any barriers to getting to rehab regularly.  He has a long history of not sleeping much and usually only gets 3-4 hrs a night and  feels like that is all he needs.  He does use a CPAP at night but not fully compliant as he has a hard time getting it back on if and when he gets up to go to bathroom. He has history of frequent falls from shuffling his feet, so he is hoping more exercise will help with that too.    Expected Outcomes Short: Attend rehab  regularly to build stamina Long: Continue to stay positive and feel better    Continue Psychosocial Services  Follow up required by staff             Psychosocial Re-Evaluation:  Psychosocial Re-Evaluation     Row Name 08/03/22 1122             Psychosocial Re-Evaluation   Current issues with Current Stress Concerns;Current Sleep Concerns       Comments Pt is currently stressed due to his upcoming lung bx next week.  He is most concerned about being put to sleep.  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  He does not want to try taking any sleep aids.       Expected Outcomes Short term:  Pt's stress will be relieved after his bx next week  Long term:  Pt will have no psychosocial barriers and continue to have a positive attitude       Interventions Stress management education;Relaxation education;Encouraged to attend Pulmonary Rehabilitation for the exercise       Continue Psychosocial Services  Follow up required by staff         Initial Review   Source of Stress Concerns Chronic Illness                Psychosocial Discharge (Final Psychosocial Re-Evaluation):  Psychosocial Re-Evaluation - 08/03/22 1122       Psychosocial Re-Evaluation   Current issues with Current Stress Concerns;Current Sleep Concerns    Comments Pt is currently stressed due to his upcoming lung bx next week.  He is most concerned about being put to sleep.  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  He does not want to try taking any sleep aids.    Expected Outcomes Short term:  Pt's stress will be relieved after his bx next week  Long term:  Pt will have no psychosocial barriers and continue to have a positive attitude    Interventions Stress management education;Relaxation education;Encouraged to attend Pulmonary Rehabilitation for the exercise    Continue Psychosocial Services  Follow up required by staff       Initial Review   Source of Stress Concerns Chronic Illness              Education: Education Goals: Education classes will be provided on a weekly basis, covering required topics. Participant will state understanding/return demonstration of topics presented.  Learning Barriers/Preferences:  Learning Barriers/Preferences - 07/18/22 0752       Learning Barriers/Preferences   Learning Barriers Sight   glasses   Learning Preferences Skilled Demonstration             Education Topics: How Lungs Work and Diseases: - Discuss the anatomy of the lungs and diseases that can affect the lungs, such as COPD.   Exercise: -Discuss the importance of exercise, FITT principles of exercise, normal and abnormal responses to exercise, and how to exercise safely.   Environmental Irritants: -Discuss types  of environmental irritants and how to limit exposure to environmental irritants. Flowsheet Row PULMONARY REHAB OTHER RESPIRATORY from 03/25/2020 in [facility]  Date 01/15/20  Educator DJ  Instruction Review Code 1- Verbalizes Understanding       Meds/Inhalers and oxygen: - Discuss respiratory medications, definition of an inhaler and oxygen, and the proper way to use an inhaler and oxygen. Flowsheet Row PULMONARY REHAB OTHER RESPIRATORY from 03/25/2020 in [place] [facility]  Date 01/15/20  Educator DJ       Energy Saving Techniques: - Discuss methods to conserve energy and decrease shortness of breath when performing activities of daily living.  Flowsheet Row PULMONARY REHAB OTHER RESPIRATORY from 03/25/2020 in [place] [facility]  Date 01/29/20  Educator mk  Instruction Review Code 2- Demonstrated Understanding       Bronchial Hygiene / Breathing Techniques: - Discuss breathing mechanics, pursed-lip breathing technique,  proper posture, effective ways to clear airways, and other functional breathing  techniques Flowsheet Row PULMONARY REHAB OTHER RESPIRATORY from 08/10/2022 in [place] [facility]  Date 07/27/22  Educator handout       Cleaning Equipment: - Provides group verbal and written instruction about the health risks of elevated stress, cause of high stress, and healthy ways to reduce stress. Flowsheet Row PULMONARY REHAB OTHER RESPIRATORY from 08/10/2022 in [place] [facility]  Date 08/10/22  Educator [facility]  Instruction Review Code 1- Verbalizes Understanding       Nutrition I: Fats: - Discuss the types of cholesterol, what cholesterol does to the body, and how cholesterol levels can be controlled. Flowsheet Row PULMONARY REHAB OTHER RESPIRATORY from 03/25/2020 in [place] [facility]  Date 02/19/20  Educator Handout  Instruction Review Code 1- Verbalizes Understanding       Nutrition II: Labels: -Discuss the different components of food labels and how to read food labels. Flowsheet Row PULMONARY REHAB OTHER RESPIRATORY from 03/25/2020 in [place] [facility]  Date 02/26/20  Educator DJ  Instruction Review Code 1- Verbalizes Understanding       Respiratory Infections: - Discuss the signs and symptoms of respiratory infections, ways to prevent respiratory infections, and the importance of seeking medical treatment when having a respiratory infection. Flowsheet Row PULMONARY REHAB OTHER RESPIRATORY from 03/25/2020 in [place] [facility]  Date 03/04/20  Educator Handout  Instruction Review Code 1- Verbalizes Understanding       Stress I: Signs and Symptoms: - Discuss the causes of stress, how stress may lead to anxiety and depression, and ways to limit stress. Flowsheet Row PULMONARY REHAB OTHER RESPIRATORY from 03/25/2020 in [place] [facility]  Date 03/11/20  Educator [facility]  Instruction Review Code 1- Verbalizes Understanding       Stress II: Relaxation: -Discuss relaxation  techniques to limit stress. Flowsheet Row PULMONARY REHAB OTHER RESPIRATORY from 03/25/2020 in [place] [facility]  Date 03/18/20  Educator [facility]  Instruction Review Code 1- Verbalizes Understanding       Oxygen for Home/Travel: - Discuss how to prepare for travel when on oxygen and proper ways to transport and store oxygen to ensure safety. Flowsheet Row PULMONARY REHAB OTHER RESPIRATORY from 03/25/2020 in [facility]  Date 03/25/20  Educator DF  Instruction Review Code 2- Demonstrated Understanding       Knowledge Questionnaire Score:  Knowledge Questionnaire Score - 07/18/22 1100       Knowledge Questionnaire Score   Pre Score 12/18  Core Components/Risk Factors/Patient Goals at Admission:  Personal Goals and Risk Factors at Admission - 07/18/22 1100       Core Components/Risk Factors/Patient Goals on Admission    Weight Management Yes;Weight Loss    Intervention Weight Management: Develop a combined nutrition and exercise program designed to reach desired caloric intake, while maintaining appropriate intake of nutrient and fiber, sodium and fats, and appropriate energy expenditure required for the weight goal.;Weight Management: Provide education and appropriate resources to help participant work on and attain dietary goals.;Weight Management/Obesity: Establish reasonable short term and long term weight goals.    Admit Weight 172 lb 1.6 oz (78.1 kg)    Goal Weight: Short Term 170 lb (77.1 kg)    Goal Weight: Long Term 170 lb (77.1 kg)    Expected Outcomes Short Term: Continue to assess and modify interventions until short term weight is achieved;Long Term: Adherence to nutrition and physical activity/exercise program aimed toward attainment of established weight goal;Weight Loss: Understanding of general recommendations for a balanced deficit meal plan, which promotes 1-2 lb weight loss per week and includes a negative energy  balance of [phone] kcal/d;Understanding recommendations for meals to include 15-35% energy as protein, 25-35% energy from fat, 35-60% energy from carbohydrates, less than 200mg  of dietary cholesterol, 20-35 gm of total fiber daily;Understanding of distribution of calorie intake throughout the day with the consumption of 4-5 meals/snacks    Tobacco Cessation Yes    Number of packs per day 3 per day (1 pack per week)    Intervention Assist the participant in steps to quit. Provide individualized education and counseling about committing to Tobacco Cessation, relapse prevention, and pharmacological support that can be provided by physician.;Education officer, environmental, assist with locating and accessing local/national Quit Smoking programs, and support quit date choice.    Expected Outcomes Short Term: Will demonstrate readiness to quit, by selecting a quit date.;Short Term: Will quit all tobacco product use, adhering to prevention of relapse plan.;Long Term: Complete abstinence from all tobacco products for at least 12 months from quit date.    Improve shortness of breath with ADL's Yes    Intervention Provide education, individualized exercise plan and daily activity instruction to help decrease symptoms of SOB with activities of daily living.    Expected Outcomes Short Term: Improve cardiorespiratory fitness to achieve a reduction of symptoms when performing ADLs;Long Term: Be able to perform more ADLs without symptoms or delay the onset of symptoms    Increase knowledge of respiratory medications and ability to use respiratory devices properly  Yes    Intervention Provide education and demonstration as needed of appropriate use of medications, inhalers, and oxygen therapy.    Expected Outcomes Long Term: Maintain appropriate use of medications, inhalers, and oxygen therapy.;Short Term: Achieves understanding of medications use. Understands that oxygen is a medication prescribed by physician.  Demonstrates appropriate use of inhaler and oxygen therapy.    Hypertension Yes    Intervention Provide education on lifestyle modifcations including regular physical activity/exercise, weight management, moderate sodium restriction and increased consumption of fresh fruit, vegetables, and low fat dairy, alcohol moderation, and smoking cessation.;Monitor prescription use compliance.    Expected Outcomes Long Term: Maintenance of blood pressure at goal levels.;Short Term: Continued assessment and intervention until BP is < 140/53mm HG in hypertensive participants. < 130/28mm HG in hypertensive participants with diabetes, heart failure or chronic kidney disease.    Lipids Yes    Intervention Provide education and support for participant on nutrition & aerobic/resistive  exercise along with prescribed medications to achieve LDL 70mg , HDL >40mg .    Expected Outcomes Long Term: Cholesterol controlled with medications as prescribed, with individualized exercise RX and with personalized nutrition plan. Value goals: LDL < 70mg , HDL > 40 mg.;Short Term: Participant states understanding of desired cholesterol values and is compliant with medications prescribed. Participant is following exercise prescription and nutrition guidelines.             Core Components/Risk Factors/Patient Goals Review:   Goals and Risk Factor Review     Row Name 07/18/22 1110 08/03/22 1117           Core Components/Risk Factors/Patient Goals Review   Personal Goals Review Tobacco Cessation Weight Management/Obesity;Hypertension;Lipids;Improve shortness of breath with ADL's;Tobacco Cessation      Review Pt has worked from 1 ppd down to 3 cigarettes a day.  He would like to continue to work towards quitting.  He no longer takes them out of the house so is already making strides.  He was commended for his success already. Pt is still smoking 3-4 cigarretes a day.  He would like to try stop smoking and has been using a nicotine  filter to try to help.  He stated that he has tried multiple methods to stop smoking and none have worked yet.  Pt's weight has increased slightly from his admission weight.      Expected Outcomes Short: Continue to work towards quitting completely  Long: Complete cessation Short term:  Continue to work towards quitting smoking  Long term:  Complete cessation and weight loss               Core Components/Risk Factors/Patient Goals at Discharge (Final Review):   Goals and Risk Factor Review - 08/03/22 1117       Core Components/Risk Factors/Patient Goals Review   Personal Goals Review Weight Management/Obesity;Hypertension;Lipids;Improve shortness of breath with ADL's;Tobacco Cessation    Review Pt is still smoking 3-4 cigarretes a day.  He would like to try stop smoking and has been using a nicotine filter to try to help.  He stated that he has tried multiple methods to stop smoking and none have worked yet.  Pt's weight has increased slightly from his admission weight.    Expected Outcomes Short term:  Continue to work towards quitting smoking  Long term:  Complete cessation and weight loss             ITP Comments:  ITP Comments     Row Name 07/18/22 0941 07/19/22 1324 07/25/22 1052 08/16/22 1202     ITP Comments Patient attend orientation today.  Patient is attendingPulmonary Rehabilitation Program.  Documentation for diagnosis can be found in [place] notes 03/28/22 and 07/12/22.  Reviewed medical chart, RPE/RPD, gym safety, and program guidelines.  Patient was fitted to equipment they will be using during rehab.  Patient is scheduled to start exercise on 07/20/22 at 1045. 30 day review completed. ITP sent to Dr.Jehanzeb Memon, Medical Director of  Pulmonary Rehab. Continue with ITP unless changes are made by physician.  Pt is still brand new, has yet to start exercise. First full day of exercise!  Patient was oriented to gym and equipment including functions, settings, policies, and  procedures.  Patient's individual exercise prescription and treatment plan were reviewed.  All starting workloads were established based on the results of the 6 minute walk test done at initial orientation visit.  The plan for exercise progression was also introduced and progression will be customized  based on patient's performance and goals. 30 day review completed. ITP sent to Dr.Jehanzeb Memon, Medical Director of  Pulmonary Rehab. Continue with ITP unless changes are made by physician. New to program             Comments: 30 day review

## 2022-08-17 ENCOUNTER — Encounter (HOSPITAL_COMMUNITY)
Admission: RE | Admit: 2022-08-17 | Discharge: 2022-08-17 | Disposition: A | Discharge status: Home / Self Care | Payer: No Typology Code available for payment source | Source: Ambulatory Visit | Attending: Registered Nurse | Admitting: Registered Nurse

## 2022-08-17 DIAGNOSIS — J449 Chronic obstructive pulmonary disease, unspecified: Secondary | ICD-10-CM

## 2022-08-17 DIAGNOSIS — R0609 Other forms of dyspnea: Secondary | ICD-10-CM | POA: Diagnosis not present

## 2022-08-17 NOTE — Progress Notes (Signed)
Daily Session Note  Patient Details  Name: Trevor Key MRN: [id number] Date of Birth: 05/30/1955 Referring Provider:   Flowsheet Row PULMONARY REHAB COPD ORIENTATION from 07/18/2022 in [facility]  Referring Provider Vito Berger, NP  [Dr. Pernell Dupre (attending MD)]       Encounter Date: 08/17/2022  Check In:  Session Check In - 08/17/22 1037       Check-In   Supervising physician immediately available to respond to emergencies See telemetry face sheet for immediately available MD    Location [facility]    Staff Present Ross Ludwig, BS, Exercise Physiologist;Debra Laural Benes, RN, Pleas Koch, RN, BSN    Virtual Visit No    Medication changes reported     No    Fall or balance concerns reported    No    Tobacco Cessation No Change    Warm-up and Cool-down Performed on first and last piece of equipment    Resistance Training Performed Yes    VAD Patient? No      Pain Assessment   Currently in Pain? No/denies             Capillary Blood Glucose: No results found for this or any previous visit (from the past 24 hour(s)).    Social History   Tobacco Use  Smoking Status Every Day   Current packs/day: 0.25   Average packs/day: 0.3 packs/day for 40.0 years (10.0 ttl pk-yrs)   Types: Cigarettes  Smokeless Tobacco Never  Tobacco Comments   He is currently enrolled in a smoking cessation program through the [place]. He reports that it is working.    Goals Met:  Proper associated with RPD/PD & O2 Sat Independence with exercise equipment Using PLB without cueing & demonstrates good technique Exercise tolerated well Queuing for purse lip breathing No report of concerns or symptoms today Strength training completed today  Goals Unmet:  Not Applicable  Comments: Pt able to follow exercise prescription today without complaint.  Will continue to monitor for progression.    Dr. Erick Blinks is Medical Director for  [facility] Pulmonary Rehab.

## 2022-08-22 ENCOUNTER — Encounter (HOSPITAL_COMMUNITY): Payer: No Typology Code available for payment source

## 2022-08-24 ENCOUNTER — Encounter (HOSPITAL_COMMUNITY): Payer: No Typology Code available for payment source

## 2022-08-29 ENCOUNTER — Encounter (HOSPITAL_COMMUNITY): Payer: No Typology Code available for payment source

## 2022-08-29 ENCOUNTER — Encounter (HOSPITAL_COMMUNITY)
Admission: RE | Admit: 2022-08-29 | Discharge: 2022-08-29 | Disposition: A | Discharge status: Home / Self Care | Payer: No Typology Code available for payment source | Source: Ambulatory Visit | Attending: Registered Nurse | Admitting: Registered Nurse

## 2022-08-29 DIAGNOSIS — R0609 Other forms of dyspnea: Secondary | ICD-10-CM | POA: Diagnosis not present

## 2022-08-29 DIAGNOSIS — J449 Chronic obstructive pulmonary disease, unspecified: Secondary | ICD-10-CM

## 2022-08-29 NOTE — Progress Notes (Signed)
Reviewed home exercise with pt today.  Pt plans to walking and playing basketball some at home for exercise.  He also belongs to [employer] for indoor needed days.  Reviewed THR, pulse, RPE, sign and symptoms, pulse oximetery and when to call 911 or MD.  Also discussed weather considerations and indoor options.  Pt voiced understanding.

## 2022-08-29 NOTE — Progress Notes (Signed)
Daily Session Note  Patient Details  Name: Trevor Key MRN: [id number] Date of Birth: 09-27-1955 Referring Provider:   Flowsheet Row PULMONARY REHAB COPD ORIENTATION from 07/18/2022 in [facility] CARDI[facility] REHABILITATION  Referring Provider Vito Berger, NP  [Dr. Pernell Dupre (attending MD)]       Encounter Date: 08/29/2022  Check In:  Session Check In - 08/29/22 1030       Check-In   Supervising physician immediately available to respond to emergencies See telemetry face sheet for immediately available MD    Physician(s) Dr. Wyline Mood    Location [facility]    Staff Present Ross Ludwig, BS, Exercise Physiologist;Jessica Juanetta Gosling, MA, RCEP, CCRP, CCET    Virtual Visit No    Medication changes reported     No    Fall or balance concerns reported    No    Tobacco Cessation No Change    Warm-up and Cool-down Performed on first and last piece of equipment    Resistance Training Performed Yes    VAD Patient? No    PAD/SET Patient? No      Pain Assessment   Currently in Pain? No/denies    Multiple Pain Sites No             Capillary Blood Glucose: No results found for this or any previous visit (from the past 24 hour(s)).    Social History   Tobacco Use  Smoking Status Every Day   Current packs/day: 0.25   Average packs/day: 0.3 packs/day for 40.0 years (10.0 ttl pk-yrs)   Types: Cigarettes  Smokeless Tobacco Never  Tobacco Comments   He is currently enrolled in a smoking cessation program through the [place]. He reports that it is working.    Goals Met:  Independence with exercise equipment Exercise tolerated well No report of concerns or symptoms today Strength training completed today  Goals Unmet:  Not Applicable  Comments: Pt able to follow exercise prescription today without complaint.  Will continue to monitor for progression.    Dr. Erick Blinks is Medical Director for [facility] Pulmonary Rehab.

## 2022-08-30 ENCOUNTER — Encounter (HOSPITAL_COMMUNITY)
Admission: RE | Admit: 2022-08-30 | Discharge: 2022-08-30 | Discharge status: Home / Self Care | Payer: No Typology Code available for payment source | Source: Ambulatory Visit | Attending: Registered Nurse | Admitting: Registered Nurse

## 2022-08-30 DIAGNOSIS — R0609 Other forms of dyspnea: Secondary | ICD-10-CM | POA: Diagnosis not present

## 2022-08-30 NOTE — Progress Notes (Signed)
Daily Session Note  Patient Details  Name: Trevor Key MRN: [id number] Date of Birth: February 27, 1955 Referring Provider:   Flowsheet Row PULMONARY REHAB COPD ORIENTATION from 07/18/2022 in [facility] CARDIAC REHABILITATION  Referring Provider Vito Berger, NP  [Dr. Pernell Dupre (attending MD)]       Encounter Date: 08/30/2022  Check In:  Session Check In - 08/30/22 1020       Check-In   Supervising physician immediately available to respond to emergencies See telemetry face sheet for immediately available MD    Location [facility]    Staff Present Ross Ludwig, BS, Exercise Physiologist;Legend Tumminello [place] BSN, RN;Jessica Rice, MA, RCEP, CCRP, Dow Adolph, RN, BSN    Virtual Visit No    Medication changes reported     No    Fall or balance concerns reported    No    Tobacco Cessation No Change    Warm-up and Cool-down Performed on first and last piece of equipment    Resistance Training Performed Yes    VAD Patient? No    PAD/SET Patient? No      Pain Assessment   Currently in Pain? No/denies    Multiple Pain Sites No             Capillary Blood Glucose: No results found for this or any previous visit (from the past 24 hour(s)).    Social History   Tobacco Use  Smoking Status Every Day   Current packs/day: 0.25   Average packs/day: 0.3 packs/day for 40.0 years (10.0 ttl pk-yrs)   Types: Cigarettes  Smokeless Tobacco Never  Tobacco Comments   He is currently enrolled in a smoking cessation program through the [place]. He reports that it is working.    Goals Met:  Proper associated with RPD/PD & O2 Sat Independence with exercise equipment Using PLB without cueing & demonstrates good technique Exercise tolerated well Queuing for purse lip breathing No report of concerns or symptoms today Strength training completed today  Goals Unmet:  Not Applicable  Comments: Marland KitchenMarland KitchenPt able to follow exercise prescription today without complaint.   Will continue to monitor for progression.    Dr. Erick Blinks is Medical Director for [facility] Pulmonary Rehab.

## 2022-08-31 ENCOUNTER — Encounter (HOSPITAL_COMMUNITY): Payer: No Typology Code available for payment source

## 2022-09-01 ENCOUNTER — Encounter (HOSPITAL_COMMUNITY)
Admission: RE | Admit: 2022-09-01 | Discharge: 2022-09-01 | Disposition: A | Discharge status: Home / Self Care | Payer: No Typology Code available for payment source | Source: Ambulatory Visit | Attending: Registered Nurse | Admitting: Registered Nurse

## 2022-09-01 DIAGNOSIS — R0609 Other forms of dyspnea: Secondary | ICD-10-CM

## 2022-09-01 DIAGNOSIS — J449 Chronic obstructive pulmonary disease, unspecified: Secondary | ICD-10-CM

## 2022-09-01 NOTE — Progress Notes (Signed)
Daily Session Note  Patient Details  Name: Trevor Key MRN: [id number] Date of Birth: 03-24-1955 Referring Provider:   Flowsheet Row PULMONARY REHAB COPD ORIENTATION from 07/18/2022 in [facility] CARDIAC REHABILITATION  Referring Provider Vito Berger, NP  [Dr. Pernell Dupre (attending MD)]       Encounter Date: 09/01/2022  Check In:  Session Check In - 09/01/22 1100       Check-In   Supervising physician immediately available to respond to emergencies See telemetry face sheet for immediately available MD    Location [facility]    Staff Present Ross Ludwig, BS, Exercise Physiologist;Jessica Juanetta Gosling, MA, RCEP, CCRP, CCET    Staff Present Kary Kos, MS, Exercise Physiologist    Virtual Visit No    Medication changes reported     No    Fall or balance concerns reported    No    Tobacco Cessation No Change    Current number of cigarettes/nicotine per day     3    Warm-up and Cool-down Performed on first and last piece of equipment    Resistance Training Performed Yes    VAD Patient? No    PAD/SET Patient? No      Pain Assessment   Currently in Pain? No/denies    Multiple Pain Sites No             Capillary Blood Glucose: No results found for this or any previous visit (from the past 24 hour(s)).    Social History   Tobacco Use  Smoking Status Every Day   Current packs/day: 0.25   Average packs/day: 0.3 packs/day for 40.0 years (10.0 ttl pk-yrs)   Types: Cigarettes  Smokeless Tobacco Never  Tobacco Comments   He is currently enrolled in a smoking cessation program through the [place]. He reports that it is working.    Goals Met:  Independence with exercise equipment Exercise tolerated well No report of concerns or symptoms today Strength training completed today  Goals Unmet:  Not Applicable  Comments: Pt able to follow exercise prescription today without complaint.  Will continue to monitor for progression.    Dr. Erick Blinks is Medical Director for [facility] Pulmonary Rehab.

## 2022-09-05 ENCOUNTER — Encounter (HOSPITAL_COMMUNITY): Payer: No Typology Code available for payment source

## 2022-09-06 ENCOUNTER — Encounter (HOSPITAL_COMMUNITY)
Admission: RE | Admit: 2022-09-06 | Discharge: 2022-09-06 | Disposition: A | Discharge status: Home / Self Care | Payer: No Typology Code available for payment source | Source: Ambulatory Visit | Attending: Registered Nurse | Admitting: Registered Nurse

## 2022-09-06 DIAGNOSIS — R0609 Other forms of dyspnea: Secondary | ICD-10-CM | POA: Diagnosis present

## 2022-09-06 DIAGNOSIS — J449 Chronic obstructive pulmonary disease, unspecified: Secondary | ICD-10-CM | POA: Insufficient documentation

## 2022-09-06 NOTE — Progress Notes (Signed)
Daily Session Note  Patient Details  Name: Trevor Key MRN: [id number] Date of Birth: Jun 05, 1955 Referring Provider:   Flowsheet Row PULMONARY REHAB COPD ORIENTATION from 07/18/2022 in [facility] CARDIAC REHABILITATION  Referring Provider Vito Berger, NP  [Dr. Pernell Dupre (attending MD)]       Encounter Date: 09/06/2022  Check In:  Session Check In - 09/06/22 1100       Check-In   Supervising physician immediately available to respond to emergencies See telemetry face sheet for immediately available MD    Location [facility]    Staff Present Ross Ludwig, BS, Exercise Physiologist;Jessica Juanetta Gosling, MA, RCEP, CCRP, CCET    Virtual Visit No    Medication changes reported     No    Fall or balance concerns reported    No    Tobacco Cessation No Change    Current number of cigarettes/nicotine per day     3    Warm-up and Cool-down Performed on first and last piece of equipment    Resistance Training Performed Yes    VAD Patient? No    PAD/SET Patient? No      Pain Assessment   Currently in Pain? No/denies    Multiple Pain Sites No             Capillary Blood Glucose: No results found for this or any previous visit (from the past 24 hour(s)).    Social History   Tobacco Use  Smoking Status Every Day   Current packs/day: 0.25   Average packs/day: 0.3 packs/day for 40.0 years (10.0 ttl pk-yrs)   Types: Cigarettes  Smokeless Tobacco Never  Tobacco Comments   He is currently enrolled in a smoking cessation program through the [place]. He reports that it is working.    Goals Met:  Independence with exercise equipment Exercise tolerated well No report of concerns or symptoms today Strength training completed today  Goals Unmet:  Not Applicable  Comments: Pt able to follow exercise prescription today without complaint.  Will continue to monitor for progression.    Dr. Erick Blinks is Medical Director for [facility] Pulmonary Rehab.

## 2022-09-07 ENCOUNTER — Encounter (HOSPITAL_COMMUNITY): Payer: No Typology Code available for payment source

## 2022-09-08 ENCOUNTER — Encounter (HOSPITAL_COMMUNITY)
Admission: RE | Admit: 2022-09-08 | Discharge: 2022-09-08 | Disposition: A | Discharge status: Home / Self Care | Payer: No Typology Code available for payment source | Source: Ambulatory Visit | Attending: Registered Nurse | Admitting: Registered Nurse

## 2022-09-08 DIAGNOSIS — R0609 Other forms of dyspnea: Secondary | ICD-10-CM

## 2022-09-08 DIAGNOSIS — J449 Chronic obstructive pulmonary disease, unspecified: Secondary | ICD-10-CM

## 2022-09-08 NOTE — Progress Notes (Signed)
Daily Session Note  Patient Details  Name: Trevor Key MRN: [id number] Date of Birth: April 27, 1955 Referring Provider:   Flowsheet Row PULMONARY REHAB COPD ORIENTATION from 07/18/2022 in [facility] CARDIAC REHABILITATION  Referring Provider Trevor Berger, NP  [Dr. Pernell Key (attending MD)]       Encounter Date: 09/08/2022  Check In:  Session Check In - 09/08/22 1045       Check-In   Supervising physician immediately available to respond to emergencies See telemetry face sheet for immediately available ER MD    Location [facility]    Staff Present Trevor Pierce, MA, RCEP, CCRP, Trevor Adolph, RN, Trevor Koch, RN, BSN    Virtual Visit No    Medication changes reported     No    Tobacco Cessation No Change    Current number of cigarettes/nicotine per day     3    Warm-up and Cool-down Performed on first and last piece of equipment    Resistance Training Performed No    VAD Patient? No    PAD/SET Patient? No      Pain Assessment   Currently in Pain? No/denies    Multiple Pain Sites No             Capillary Blood Glucose: No results found for this or any previous visit (from the past 24 hour(s)).    Social History   Tobacco Use  Smoking Status Every Day   Current packs/day: 0.25   Average packs/day: 0.3 packs/day for 40.0 years (10.0 ttl pk-yrs)   Types: Cigarettes  Smokeless Tobacco Never  Tobacco Comments   He is currently enrolled in a smoking cessation program through the [place]. He reports that it is working.    Goals Met:  Independence with exercise equipment Exercise tolerated well No report of concerns or symptoms today Strength training completed today  Goals Unmet:  Not Applicable  Comments: Pt able to follow exercise prescription today without complaint.  Will continue to monitor for progression.    Dr. Dina Key is Medical Director for [facility] Cardiac Rehab

## 2022-09-11 ENCOUNTER — Encounter (HOSPITAL_COMMUNITY): Payer: No Typology Code available for payment source

## 2022-09-12 ENCOUNTER — Encounter (HOSPITAL_COMMUNITY): Payer: No Typology Code available for payment source

## 2022-09-13 ENCOUNTER — Encounter (HOSPITAL_COMMUNITY): Payer: Self-pay | Admitting: *Deleted

## 2022-09-13 ENCOUNTER — Encounter (HOSPITAL_COMMUNITY)
Admission: RE | Admit: 2022-09-13 | Discharge: 2022-09-13 | Disposition: A | Discharge status: Home / Self Care | Payer: No Typology Code available for payment source | Source: Ambulatory Visit | Attending: Registered Nurse | Admitting: Registered Nurse

## 2022-09-13 DIAGNOSIS — J449 Chronic obstructive pulmonary disease, unspecified: Secondary | ICD-10-CM

## 2022-09-13 DIAGNOSIS — R0609 Other forms of dyspnea: Secondary | ICD-10-CM

## 2022-09-13 NOTE — Progress Notes (Signed)
Pulmonary Individual Treatment Plan  Patient Details  Name: Trevor Key MRN: [id number] Date of Birth: 24-Oct-1955 Referring Provider:   Flowsheet Row PULMONARY REHAB COPD ORIENTATION from 07/18/2022 in [facility] [facility]  Referring Provider Vito Berger, NP  [Dr. Pernell Dupre (attending MD)]       Initial Encounter Date:  Flowsheet Row PULMONARY REHAB COPD ORIENTATION from 07/18/2022 in [facility]  Date 07/18/22       Visit Diagnosis: Dyspnea on exertion  Chronic obstructive pulmonary disease, unspecified COPD type (HCC)  Patient's Home Medications on Admission:   Current Outpatient Medications:    albuterol (PROVENTIL HFA;VENTOLIN HFA) 108 (90 BASE) MCG/ACT inhaler, Inhale 1-2 puffs into the lungs every 6 (six) hours as needed for wheezing., Disp: 1 Inhaler, Rfl: 0   aspirin 81 MG chewable tablet, Chew 81 mg by mouth daily., Disp: , Rfl:    atorvastatin (LIPITOR) 80 MG tablet, Take 80 mg by mouth daily., Disp: , Rfl:    cholecalciferol (VITAMIN D3) 25 MCG (1000 UNIT) tablet, Take 1,000 Units by mouth daily., Disp: , Rfl:    losartan (COZAAR) 100 MG tablet, Take 100 mg by mouth daily., Disp: , Rfl:    metoprolol succinate (TOPROL-XL) 50 MG 24 hr tablet, Take 50 mg by mouth daily. Take with or immediately following a meal., Disp: , Rfl:    nitroGLYCERIN (NITROLINGUAL) 0.4 MG/SPRAY spray, Place 1 spray under the tongue every 5 (five) minutes as needed. For chest pain., Disp: , Rfl:    omeprazole (PRILOSEC) 20 MG capsule, Take 20 mg by mouth daily., Disp: , Rfl:    primidone (MYSOLINE) 50 MG tablet, Take 50 mg by mouth daily., Disp: , Rfl:    tiotropium (SPIRIVA) 18 MCG inhalation capsule, Place 18 mcg into inhaler and inhale daily., Disp: , Rfl:   Past Medical History: Past Medical History:  Diagnosis Date   Coronary atherosclerosis of native coronary artery    BMS LAD 1/13, LVEF 55-60%   Hypercholesteremia    Hypertension    MI  (myocardial infarction) (HCC)    AMI 1/13    Tobacco Use: Social History   Tobacco Use  Smoking Status Every Day   Current packs/day: 0.25   Average packs/day: 0.3 packs/day for 40.0 years (10.0 ttl pk-yrs)   Types: Cigarettes  Smokeless Tobacco Never  Tobacco Comments   He is currently enrolled in a smoking cessation program through the [place]. He reports that it is working.    Labs: Review Flowsheet       Latest Ref Rng & Units 03/27/2011 10/28/2011 05/19/2016 05/20/2016  Labs for ITP Cardiac and Pulmonary Rehab  Cholestrol 0 - 200 mg/dL 409  - - 811   LDL (calc) 0 - 99 mg/dL 66  - - 71   HDL-C >91 mg/dL 33  - - 33   Trlycerides <150 mg/dL 96  - - 93   Hemoglobin A1c 4.8 - 5.6 % - - - 5.9   TCO2 0 - 100 mmol/L - 26  28  -    Details            Capillary Blood Glucose: No results found for: "GLUCAP"   Pulmonary Assessment Scores:  Pulmonary Assessment Scores     Row Name 07/18/22 1123         ADL UCSD   ADL Phase Entry     SOB Score total 87     Rest 0     Walk 1  Stairs 5     Bath 3     Dress 3     Shop 5       CAT Score   CAT Score 22       mMRC Score   mMRC Score 1             UCSD: Self-administered rating of dyspnea associated with activities of daily living (ADLs) 6-point scale (0 = "not at all" to 5 = "maximal or unable to do because of breathlessness")  Scoring Scores range from 0 to 120.  Minimally important difference is 5 units  CAT: CAT can identify the health impairment of COPD patients and is better correlated with disease progression.  CAT has a scoring range of zero to 40. The CAT score is classified into four groups of low (less than 10), medium (10 - 20), high (21-30) and very high (31-40) based on the impact level of disease on health status. A CAT score over 10 suggests significant symptoms.  A worsening CAT score could be explained by an exacerbation, poor medication adherence, poor inhaler technique, or progression of COPD  or comorbid conditions.  CAT MCID is 2 points  mMRC: mMRC (Modified Medical Research Council) Dyspnea Scale is used to assess the degree of baseline functional disability in patients of respiratory disease due to dyspnea. No minimal important difference is established. A decrease in score of 1 point or greater is considered a positive change.   Pulmonary Function Assessment:  Pulmonary Function Assessment - 07/18/22 1126       Breath   Shortness of Breath Yes;Limiting activity;Fear of Shortness of Breath             Exercise Target Goals: Exercise Program Goal: Individual exercise prescription set using results from initial 6 min walk test and THRR while considering  patient's activity barriers and safety.   Exercise Prescription Goal: Initial exercise prescription builds to 30-45 minutes a day of aerobic activity, 2-3 days per week.  Home exercise guidelines will be given to patient during program as part of exercise prescription that the participant will acknowledge.  Activity Barriers & Risk Stratification:  Activity Barriers & Cardiac Risk Stratification - 07/18/22 0756       Activity Barriers & Cardiac Risk Stratification   Activity Barriers Arthritis;Joint Problems;History of Falls   R shoulder bothersome            6 Minute Walk:  6 Minute Walk     Row Name 07/18/22 0944         6 Minute Walk   Phase Initial     Distance 1374 feet     Walk Time 6 minutes     # of Rest Breaks 0     MPH 2.6     METS 3.28     RPE 12     Perceived Dyspnea  3     VO2 Peak 11.49     Symptoms Yes (comment)     Comments SOB, chest pain 3/10 at end     Resting HR 61 bpm     Resting BP 146/74     Resting Oxygen Saturation  97 %     Exercise Oxygen Saturation  during 6 min walk 95 %     Max Ex. HR 89 bpm     Max Ex. BP 176/64     2 Minute Post BP 132/64       Interval HR   1 Minute HR 65  2 Minute HR --  error     3 Minute HR --  error     4 Minute HR --  error      5 Minute HR --  error     6 Minute HR 89     2 Minute Post HR 69     Interval Heart Rate? Yes       Interval Oxygen   Interval Oxygen? Yes     Baseline Oxygen Saturation % 97 %     1 Minute Oxygen Saturation % 96 %     1 Minute Liters of Oxygen 0 L  Room Air     2 Minute Oxygen Saturation % 95 %     2 Minute Liters of Oxygen 0 L     3 Minute Oxygen Saturation % 95 %     3 Minute Liters of Oxygen 0 L     4 Minute Oxygen Saturation % 95 %     4 Minute Liters of Oxygen 0 L     5 Minute Oxygen Saturation % 95 %     5 Minute Liters of Oxygen 0 L     6 Minute Oxygen Saturation % 95 %     6 Minute Liters of Oxygen 0 L     2 Minute Post Oxygen Saturation % 96 %     2 Minute Post Liters of Oxygen 0 L              Oxygen Initial Assessment:  Oxygen Initial Assessment - 07/18/22 1121       Home Oxygen   Home Oxygen Device None    Sleep Oxygen Prescription CPAP    Liters per minute 0    Home Exercise Oxygen Prescription None    Home Resting Oxygen Prescription None    Compliance with Home Oxygen Use No   does not put back on after getting up to go to bathroom     Initial 6 min Walk   Oxygen Used None      Program Oxygen Prescription   Program Oxygen Prescription None      Intervention   Short Term Goals To learn and exhibit compliance with exercise, home and travel O2 prescription;To learn and understand importance of monitoring SPO2 with pulse oximeter and demonstrate accurate use of the pulse oximeter.;To learn and understand importance of maintaining oxygen saturations>88%;To learn and demonstrate proper pursed lip breathing techniques or other breathing techniques. ;To learn and demonstrate proper use of respiratory medications    Long  Term Goals Exhibits compliance with exercise, home  and travel O2 prescription;Verbalizes importance of monitoring SPO2 with pulse oximeter and return demonstration;Maintenance of O2 saturations>88%;Exhibits proper breathing techniques,  such as pursed lip breathing or other method taught during program session;Compliance with respiratory medication;Demonstrates proper use of MDI's             Oxygen Re-Evaluation:  Oxygen Re-Evaluation     Row Name 07/18/22 1122 08/03/22 1127 08/04/22 1232 08/29/22 1105       Program Oxygen Prescription   Program Oxygen Prescription -- None -- None      Home Oxygen   Home Oxygen Device -- None -- None    Sleep Oxygen Prescription -- CPAP -- CPAP    Liters per minute -- --  pt is unsure of the oxygen concentration with his CPAP -- --    Home Exercise Oxygen Prescription -- None -- None    Home Resting Oxygen Prescription -- None --  None    Compliance with Home Oxygen Use -- No  pt does not put his CPAP back on after he gets up at night to go to the bathroom -- Yes  Only wears when first go to sleep      Goals/Expected Outcomes   Short Term Goals To learn and demonstrate proper pursed lip breathing techniques or other breathing techniques. ;To learn and understand importance of maintaining oxygen saturations>88%;To learn and understand importance of monitoring SPO2 with pulse oximeter and demonstrate accurate use of the pulse oximeter. To learn and understand importance of maintaining oxygen saturations>88%;To learn and demonstrate proper pursed lip breathing techniques or other breathing techniques. ;To learn and demonstrate proper use of respiratory medications;To learn and understand importance of monitoring SPO2 with pulse oximeter and demonstrate accurate use of the pulse oximeter. To learn and demonstrate proper pursed lip breathing techniques or other breathing techniques.  To learn and understand importance of monitoring SPO2 with pulse oximeter and demonstrate accurate use of the pulse oximeter.;To learn and understand importance of maintaining oxygen saturations>88%;To learn and demonstrate proper pursed lip breathing techniques or other breathing techniques. ;To learn and  demonstrate proper use of respiratory medications    Long  Term Goals Exhibits proper breathing techniques, such as pursed lip breathing or other method taught during program session;Maintenance of O2 saturations>88%;Verbalizes importance of monitoring SPO2 with pulse oximeter and return demonstration Exhibits proper breathing techniques, such as pursed lip breathing or other method taught during program session;Maintenance of O2 saturations>88%;Verbalizes importance of monitoring SPO2 with pulse oximeter and return demonstration;Compliance with respiratory medication Exhibits proper breathing techniques, such as pursed lip breathing or other method taught during program session Verbalizes importance of monitoring SPO2 with pulse oximeter and return demonstration;Maintenance of O2 saturations>88%;Exhibits proper breathing techniques, such as pursed lip breathing or other method taught during program session;Compliance with respiratory medication    Comments Reviewed PLB technique with pt.  Talked about how it works and it's importance in maintaining their exercise saturations. Pt is using PLB at times and using his inhalers as prescribed. Diaphragmatic and PLB breathing explained and performed with patient. Patient has a better understanding of how to do these exercises to help with breathing performance and relaxation. Patient performed breathing techniques adequately and to practice further at home. Trevor Key is doing well in rehab.  He is having a hard time with his CPAP at night. When he first goes to bed it works fine, but once he wakes at night, he has a hard time getting it back on properly.  He was encouraged to keep trying to get it back on properly when he takes it off.  He has ordered a pulse oximeter that should arrive this week.  He is feeling better with his breathing and feels like the PLB is working.  He has not needed his inhalers recently and feeling better.    Goals/Expected Outcomes Short: Become  more profiecient at using PLB.   Long: Become independent at using PLB. Short term:  Become more proficient at using PLB and inquire about a home pulse ox  Long term:  Continue compliance with resp medications Short: practice PLB and diaphragmatic breathing at home. Long: Use PLB and diaphragmatic breathing independently Short: Start using pulse oximeter Long: continue to work on using CPAP             Oxygen Discharge (Final Oxygen Re-Evaluation):  Oxygen Re-Evaluation - 08/29/22 1105       Program Oxygen Prescription   Program  Oxygen Prescription None      Home Oxygen   Home Oxygen Device None    Sleep Oxygen Prescription CPAP    Home Exercise Oxygen Prescription None    Home Resting Oxygen Prescription None    Compliance with Home Oxygen Use Yes   Only wears when first go to sleep     Goals/Expected Outcomes   Short Term Goals To learn and understand importance of monitoring SPO2 with pulse oximeter and demonstrate accurate use of the pulse oximeter.;To learn and understand importance of maintaining oxygen saturations>88%;To learn and demonstrate proper pursed lip breathing techniques or other breathing techniques. ;To learn and demonstrate proper use of respiratory medications    Long  Term Goals Verbalizes importance of monitoring SPO2 with pulse oximeter and return demonstration;Maintenance of O2 saturations>88%;Exhibits proper breathing techniques, such as pursed lip breathing or other method taught during program session;Compliance with respiratory medication    Comments Fedrick is doing well in rehab.  He is having a hard time with his CPAP at night. When he first goes to bed it works fine, but once he wakes at night, he has a hard time getting it back on properly.  He was encouraged to keep trying to get it back on properly when he takes it off.  He has ordered a pulse oximeter that should arrive this week.  He is feeling better with his breathing and feels like the PLB is working.   He has not needed his inhalers recently and feeling better.    Goals/Expected Outcomes Short: Start using pulse oximeter Long: continue to work on using CPAP             Initial Exercise Prescription:  Initial Exercise Prescription - 07/18/22 0900       Date of Initial Exercise RX and Referring Provider   Date 07/18/22    Referring Provider Vito Berger, NP   Dr. Pernell Dupre (attending MD)     Oxygen   Maintain Oxygen Saturation 88% or higher      Treadmill   MPH 2.5    Grade 0.5    Minutes 15    METs 3.09      REL-XR   Level 3    Speed 50    Minutes 15    METs 3.2      Prescription Details   Frequency (times per week) 2    Duration Progress to 30 minutes of continuous aerobic without signs/symptoms of physical distress      Intensity   THRR 40-80% of Max Heartrate 98-135    Ratings of Perceived Exertion 11-13    Perceived Dyspnea 0-4      Progression   Progression Continue to progress workloads to maintain intensity without signs/symptoms of physical distress.      Resistance Training   Training Prescription Yes    Weight 5 lb    Reps 10-15             Perform Capillary Blood Glucose checks as needed.  Exercise Prescription Changes:   Exercise Prescription Changes     Row Name 07/18/22 0900 08/01/22 1200 08/15/22 1200 08/29/22 1000 09/08/22 1500     Response to Exercise   Blood Pressure (Admit) 146/74 138/70 154/80 -- 156/70   Blood Pressure (Exercise) 176/64 144/80 142/80 -- --   Blood Pressure (Exit) 132/64 134/62 128/62 -- 130/74   Heart Rate (Admit) 61 bpm 62 bpm 69 bpm -- 64 bpm   Heart Rate (Exercise) 89 bpm 87 bpm  85 bpm -- 89 bpm   Heart Rate (Exit) 72 bpm 56 bpm 66 bpm -- 65 bpm   Oxygen Saturation (Admit) 97 % 97 % 98 % -- 98 %   Oxygen Saturation (Exercise) 95 % 99 % 98 % -- 97 %   Oxygen Saturation (Exit) 97 % 98 % 99 % -- 99 %   Rating of Perceived Exertion (Exercise) 12 12 13  -- 15   Perceived Dyspnea (Exercise) 3 2 3  -- 3    Symptoms SOB, chest pain (3/10) -- -- -- --   Comments walk test results -- -- -- --   Duration -- Continue with 30 min of aerobic exercise without signs/symptoms of physical distress. Continue with 30 min of aerobic exercise without signs/symptoms of physical distress. -- Continue with 30 min of aerobic exercise without signs/symptoms of physical distress.   Intensity -- THRR unchanged THRR unchanged -- THRR unchanged     Progression   Progression -- Continue to progress workloads to maintain intensity without signs/symptoms of physical distress. Continue to progress workloads to maintain intensity without signs/symptoms of physical distress. -- Continue to progress workloads to maintain intensity without signs/symptoms of physical distress.     Resistance Training   Training Prescription -- Yes Yes -- Yes   Weight -- 5 5 -- 4   Reps -- 10-15 10-15 -- 10-15     Treadmill   MPH -- 2.5 2.5 -- 2.5   Grade -- 0.5 0.5 -- 0.5   Minutes -- 15 15 -- 15   METs -- 3.09 3.09 -- 3.09     REL-XR   Level -- 1 2 -- 1   Speed -- 44 45 -- 45   Minutes -- 15 15 -- 15   METs -- 2.3 1.8 -- 2.8     Home Exercise Plan   Plans to continue exercise at -- -- -- Home (comment)  walking, [employer] [facility] (comment)   Frequency -- -- -- Add 3 additional days to program exercise sessions. Add 3 additional days to program exercise sessions.   Initial Home Exercises Provided -- -- -- 08/29/22 --     Oxygen   Maintain Oxygen Saturation -- 88% or higher 88% or higher -- 88% or higher            Exercise Comments:   Exercise Goals and Review:   Exercise Goals     Row Name 07/18/22 0955             Exercise Goals   Increase Physical Activity Yes       Intervention Provide advice, education, support and counseling about physical activity/exercise needs.;Develop an individualized exercise prescription for aerobic and resistive training based on initial evaluation findings, risk  stratification, comorbidities and participant's personal goals.       Expected Outcomes Short Term: Attend rehab on a regular basis to increase amount of physical activity.;Long Term: Add in home exercise to make exercise part of routine and to increase amount of physical activity.;Long Term: Exercising regularly at least 3-5 days a week.       Increase Strength and Stamina Yes       Intervention Provide advice, education, support and counseling about physical activity/exercise needs.;Develop an individualized exercise prescription for aerobic and resistive training based on initial evaluation findings, risk stratification, comorbidities and participant's personal goals.       Expected Outcomes Short Term: Increase workloads from initial exercise prescription for resistance, speed, and METs.;Short Term: Perform resistance training  exercises routinely during rehab and add in resistance training at home;Long Term: Improve cardiorespiratory fitness, muscular endurance and strength as measured by increased METs and functional capacity ( )       Able to understand and use rate of perceived exertion (RPE) scale Yes       Intervention Provide education and explanation on how to use RPE scale       Expected Outcomes Short Term: Able to use RPE daily in rehab to express subjective intensity level;Long Term:  Able to use RPE to guide intensity level when exercising independently       Able to understand and use Dyspnea scale Yes       Intervention Provide education and explanation on how to use Dyspnea scale       Expected Outcomes Short Term: Able to use Dyspnea scale daily in rehab to express subjective sense of shortness of breath during exertion;Long Term: Able to use Dyspnea scale to guide intensity level when exercising independently       Knowledge and understanding of Target Heart Rate Range (THRR) Yes       Intervention Provide education and explanation of THRR including how the numbers were predicted  and where they are located for reference       Expected Outcomes Short Term: Able to state/look up THRR;Short Term: Able to use daily as guideline for intensity in rehab;Long Term: Able to use THRR to govern intensity when exercising independently       Able to check pulse independently Yes       Intervention Provide education and demonstration on how to check pulse in carotid and radial arteries.;Review the importance of being able to check your own pulse for safety during independent exercise       Expected Outcomes Short Term: Able to explain why pulse checking is important during independent exercise;Long Term: Able to check pulse independently and accurately       Understanding of Exercise Prescription Yes       Intervention Provide education, explanation, and written materials on patient's individual exercise prescription       Expected Outcomes Short Term: Able to explain program exercise prescription;Long Term: Able to explain home exercise prescription to exercise independently                Exercise Goals Re-Evaluation :  Exercise Goals Re-Evaluation     Row Name 07/18/22 1033 08/01/22 1256 08/03/22 1055 08/22/22 0757 08/29/22 1053     Exercise Goal Re-Evaluation   Exercise Goals Review Able to understand and use rate of perceived exertion (RPE) scale;Able to understand and use Dyspnea scale;Understanding of Exercise Prescription -- Increase Physical Activity;Increase Strength and Stamina;Understanding of Exercise Prescription Increase Physical Activity;Increase Strength and Stamina;Understanding of Exercise Prescription Increase Physical Activity;Increase Strength and Stamina;Understanding of Exercise Prescription   Comments Reviewed RPE  and dyspnea scale and program prescription with pt today.  Pt voiced understanding and was given a copy of goals to take home. Pt has just started rehab and has not increased his workloads yet. He is exercising at an RPE of 12 on both stations Pt  states that some days he feels a little stronger since he started the program.  He has been able to go up on his intensity during the workouts in rehab.  He has been walking everyday, sometimes 2-3 times daily at home. Pt has increased his level on the XR to level 2. He is exercising at on RPE of 13 on the XR. Will  continue to monitor and progress as able. Trevor Key is doing well in rehab.  He is starting to feel like his strength and stamina are returning.  Reviewed home exercise with pt today.  Pt plans to walking and playing basketball some at home for exercise.  He also belongs to [employer] for indoor needed days.  Reviewed THR, pulse, RPE, sign and symptoms, pulse oximetery and when to call 911 or MD.  Also discussed weather considerations and indoor options.  Pt voiced understanding.   Expected Outcomes Short: Use RPE daily to regulate intensity.  Long: Follow program prescription -- Short term:  go over home exercise routine  Long term:  Go up on his levels on the TM and elliptical in the program SHort term: go up in grade on the treadmill in the next two weeks   long term: continue to attend pulmonary rehab Short: start to add in walking and weights at home consistently Long: conitnue to improve stamina            Discharge Exercise Prescription (Final Exercise Prescription Changes):  Exercise Prescription Changes - 09/08/22 1500       Response to Exercise   Blood Pressure (Admit) 156/70    Blood Pressure (Exit) 130/74    Heart Rate (Admit) 64 bpm    Heart Rate (Exercise) 89 bpm    Heart Rate (Exit) 65 bpm    Oxygen Saturation (Admit) 98 %    Oxygen Saturation (Exercise) 97 %    Oxygen Saturation (Exit) 99 %    Rating of Perceived Exertion (Exercise) 15    Perceived Dyspnea (Exercise) 3    Duration Continue with 30 min of aerobic exercise without signs/symptoms of physical distress.    Intensity THRR unchanged      Progression   Progression Continue to progress workloads to  maintain intensity without signs/symptoms of physical distress.      Resistance Training   Training Prescription Yes    Weight 4    Reps 10-15      Treadmill   MPH 2.5    Grade 0.5    Minutes 15    METs 3.09      REL-XR   Level 1    Speed 45    Minutes 15    METs 2.8      Home Exercise Plan   Plans to continue exercise at Home (comment)    Frequency Add 3 additional days to program exercise sessions.      Oxygen   Maintain Oxygen Saturation 88% or higher             Nutrition:  Target Goals: Understanding of nutrition guidelines, daily intake of sodium 1500mg , cholesterol 200mg , calories 30% from fat and 7% or less from saturated fats, daily to have 5 or more servings of fruits and vegetables.  Biometrics:  Pre Biometrics - 07/18/22 1033       Pre Biometrics   Height 5\' 5"  (1.651 m)    Weight 172 lb 1.6 oz (78.1 kg)    Waist Circumference 34 inches    Hip Circumference 38 inches    Waist to Hip Ratio 0.89 %    BMI (Calculated) 28.64    Triceps Skinfold 15 mm    % Body Fat 25.4 %    Grip Strength 31.6 kg    Single Leg Stand 12.7 seconds              Nutrition Therapy Plan and Nutrition Goals:  Nutrition Therapy &  Goals - 07/18/22 0755       Intervention Plan   Intervention Prescribe, educate and counsel regarding individualized specific dietary modifications aiming towards targeted core components such as weight, hypertension, lipid management, diabetes, heart failure and other comorbidities.;Nutrition handout(s) given to patient.    Expected Outcomes Short Term Goal: Understand basic principles of dietary content, such as calories, fat, sodium, cholesterol and nutrients.             Nutrition Assessments:  Nutrition Assessments - 07/18/22 1052       MEDFICTS Scores   Pre Score 116            MEDIFICTS Score Key: >=70 Need to make dietary changes  40-70 Heart Healthy Diet <= 40 Therapeutic Level Cholesterol Diet   Picture Your  Plate Scores: <16 Unhealthy dietary pattern with much room for improvement. 41-50 Dietary pattern unlikely to meet recommendations for good health and room for improvement. 51-60 More healthful dietary pattern, with some room for improvement.  >60 Healthy dietary pattern, although there may be some specific behaviors that could be improved.    Nutrition Goals Re-Evaluation:  Nutrition Goals Re-Evaluation     Row Name 08/03/22 1102 08/29/22 1056           Goals   Nutrition Goal Heart healthy diet with plenty of protein Short term: Pt will try to eat more grilled chicken vs red meat Long term: Try to work on healthier eating habits      Comment The pt loves fried foods and eats a lot of red meat, but he also tries to stay away from sweets and salty foods.  He has a good support system at home with his family members trying to help the pt try healthier foods. [place] is doing well in rehab.  He is struggling with his diet.  He admits that his diet is not where it should be.  He loves fried foods and sweets.  He has been doing it for so long and enjoys it.  He has not found a lot of healthy foods he likes.  He has not tried too many though.  He has been getting more rotissorery chicken and makes them himself.  He does eat a banana each day.      Expected Outcome Short term:  Pt will try to eat more grilled chicken vs red meat Long term:  Try to work on healthier eating habits Short: Try more fruits and vegetables Long: continue to work towards more healthy eating               Nutrition Goals Discharge (Final Nutrition Goals Re-Evaluation):  Nutrition Goals Re-Evaluation - 08/29/22 1056       Goals   Nutrition Goal Short term: Pt will try to eat more grilled chicken vs red meat Long term: Try to work on healthier eating habits    Comment Trevor Key is doing well in rehab.  He is struggling with his diet.  He admits that his diet is not where it should be.  He loves fried foods and sweets.  He  has been doing it for so long and enjoys it.  He has not found a lot of healthy foods he likes.  He has not tried too many though.  He has been getting more rotissorery chicken and makes them himself.  He does eat a banana each day.    Expected Outcome Short: Try more fruits and vegetables Long: continue to work towards more healthy eating  Psychosocial: Target Goals: Acknowledge presence or absence of significant depression and/or stress, maximize coping skills, provide positive support system. Participant is able to verbalize types and ability to use techniques and skills needed for reducing stress and depression.  Initial Review & Psychosocial Screening:  Initial Psych Review & Screening - 07/18/22 0753       Initial Review   Current issues with Current Stress Concerns;None Identified;Current Sleep Concerns    Source of Stress Concerns Chronic Illness    Comments only sleeps 3-4 hrs a night (chronic issue), SOB is limiting his ability to do things      Family Dynamics   Good Support System? Yes   wife and kids that come by to check weekly     Barriers   Psychosocial barriers to participate in program Psychosocial barriers identified (see note);The patient should benefit from training in stress management and relaxation.      Screening Interventions   Interventions Encouraged to exercise;Provide feedback about the scores to participant    Expected Outcomes Short Term goal: Utilizing psychosocial counselor, staff and physician to assist with identification of specific Stressors or current issues interfering with healing process. Setting desired goal for each stressor or current issue identified.;Long Term Goal: Stressors or current issues are controlled or eliminated.;Short Term goal: Identification and review with participant of any Quality of Life or Depression concerns found by scoring the questionnaire.;Long Term goal: The participant improves quality of Life and PHQ9  Scores as seen by post scores and/or verbalization of changes             Quality of Life Scores:  Quality of Life - 07/18/22 1052       Quality of Life   Select Quality of Life      Quality of Life Scores   Health/Function Pre 21.38 %    Socioeconomic Pre 29.14 %    Psych/Spiritual Pre 30 %    Family Pre 24 %    GLOBAL Pre 25.03 %            Scores of 19 and below usually indicate a poorer quality of life in these areas.  A difference of  2-3 points is a clinically meaningful difference.  A difference of 2-3 points in the total score of the Quality of Life Index has been associated with significant improvement in overall quality of life, self-image, physical symptoms, and general health in studies assessing change in quality of life.   PHQ-9: Review Flowsheet       07/18/2022 01/09/2020 02/07/2011  Depression screen PHQ 2/9  Decreased Interest 0 0 0  Down, Depressed, Hopeless 0 0 0  PHQ - 2 Score 0 0 0  Altered sleeping 0 1 -  Tired, decreased energy 3 1 -  Change in appetite 0 0 -  Feeling bad or failure about yourself  0 0 -  Trouble concentrating 0 0 -  Moving slowly or fidgety/restless 0 0 -  Suicidal thoughts 0 0 -  PHQ-9 Score 3 2 -  Difficult doing work/chores Not difficult at all Not difficult at all -    Details           Interpretation of Total Score  Total Score Depression Severity:  1-4 = Minimal depression, 5-9 = Mild depression, 10-14 = Moderate depression, 15-19 = Moderately severe depression, 20-27 = Severe depression   Psychosocial Evaluation and Intervention:  Psychosocial Evaluation - 07/18/22 1039       Psychosocial Evaluation & Interventions  Interventions Relaxation education;Encouraged to exercise with the program and follow exercise prescription    Comments Trevor Key is coming into Pulmonary Rehab via the [place].  He requested to return to [place] as he has done program before and found in beneficial.  He used to help and support local high  school, but has reitred from school as of last fall and has found that not being as active he is now feeling increased fatigue and DOE more than he has ever before.  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  He really wants to attend rehab to get moving again and to breathe better.  He also wants to boost his stamina and energy level so that he feels more like himself again.  He still goes for a walk most days but then finds he needs to rest afterwards.  His daughter is a Systems analyst and she like all his kids are constantly on him about moving more.  He lives with his wife who is still working as well.  His kids all live near by and drop by to check in and call frequently.  He does not have any barriers to getting to rehab regularly.  He has a long history of not sleeping much and usually only gets 3-4 hrs a night and feels like that is all he needs.  He does use a CPAP at night but not fully compliant as he has a hard time getting it back on if and when he gets up to go to bathroom. He has history of frequent falls from shuffling his feet, so he is hoping more exercise will help with that too.    Expected Outcomes Short: Attend rehab regularly to build stamina Long: Continue to stay positive and feel better    Continue Psychosocial Services  Follow up required by staff             Psychosocial Re-Evaluation:  Psychosocial Re-Evaluation     Row Name 08/03/22 1122 08/29/22 1054           Psychosocial Re-Evaluation   Current issues with Current Stress Concerns;Current Sleep Concerns Current Sleep Concerns      Comments Pt is currently stressed due to his upcoming lung bx next week.  He is most concerned about being put to sleep.  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  He does not want to try taking any sleep aids. Trevor Key is doing well in rehab. He is feeling good mentally and denies  any major stressors.  He continues to struggle with sleep. He usually only sleeps 2-3 hrs at night then two to three 30-60 min naps during day.  He has never been one to sleep a lot at once since driving his truck.  He is enjoying rehab and getting out to talk with people.      Expected Outcomes Short term:  Pt's stress will be relieved after his bx next week  Long term:  Pt will have no psychosocial barriers and continue to have a positive attitude Short: Continue to work on sleep Long: conitnue to attend rehab for mental boost      Interventions Stress management education;Relaxation education;Encouraged to attend Pulmonary Rehabilitation for the exercise Encouraged to attend Pulmonary Rehabilitation for the exercise      Continue Psychosocial Services  Follow up required by staff Follow up required by  staff        Initial Review   Source of Stress Concerns Chronic Illness --               Psychosocial Discharge (Final Psychosocial Re-Evaluation):  Psychosocial Re-Evaluation - 08/29/22 1054       Psychosocial Re-Evaluation   Current issues with Current Sleep Concerns    Comments Trevor Key is doing well in rehab. He is feeling good mentally and denies any major stressors.  He continues to struggle with sleep. He usually only sleeps 2-3 hrs at night then two to three 30-60 min naps during day.  He has never been one to sleep a lot at once since driving his truck.  He is enjoying rehab and getting out to talk with people.    Expected Outcomes Short: Continue to work on sleep Long: conitnue to attend rehab for mental boost    Interventions Encouraged to attend Pulmonary Rehabilitation for the exercise    Continue Psychosocial Services  Follow up required by staff              Education: Education Goals: Education classes will be provided on a weekly basis, covering required topics. Participant will state understanding/return demonstration of topics presented.  Learning  Barriers/Preferences:  Learning Barriers/Preferences - 07/18/22 0752       Learning Barriers/Preferences   Learning Barriers Sight   glasses   Learning Preferences Skilled Demonstration             Education Topics: How Lungs Work and Diseases: - Discuss the anatomy of the lungs and diseases that can affect the lungs, such as COPD.   Exercise: -Discuss the importance of exercise, FITT principles of exercise, normal and abnormal responses to exercise, and how to exercise safely.   Environmental Irritants: -Discuss types of environmental irritants and how to limit exposure to environmental irritants. Flowsheet Row PULMONARY REHAB OTHER RESPIRATORY from 03/25/2020 in [place] [facility]  Date 01/15/20  Educator DJ  Instruction Review Code 1- Verbalizes Understanding       Meds/Inhalers and oxygen: - Discuss respiratory medications, definition of an inhaler and oxygen, and the proper way to use an inhaler and oxygen. Flowsheet Row PULMONARY REHAB OTHER RESPIRATORY from 03/25/2020 in [place] [facility]  Date 01/15/20  Educator DJ       Energy Saving Techniques: - Discuss methods to conserve energy and decrease shortness of breath when performing activities of daily living.  Flowsheet Row PULMONARY REHAB OTHER RESPIRATORY from 09/06/2022 in [place] [facility]  Date 08/17/22  Educator HB  Instruction Review Code 2- Demonstrated Understanding       Bronchial Hygiene / Breathing Techniques: - Discuss breathing mechanics, pursed-lip breathing technique,  proper posture, effective ways to clear airways, and other functional breathing techniques Flowsheet Row PULMONARY REHAB OTHER RESPIRATORY from 09/06/2022 in [place] [facility]  Date 07/27/22  Educator handout       Cleaning Equipment: - Provides group verbal and written instruction about the health risks of elevated stress, cause of high stress, and healthy ways  to reduce stress. Flowsheet Row PULMONARY REHAB OTHER RESPIRATORY from 09/06/2022 in [place] [facility]  Date 08/10/22  Educator [facility]  Instruction Review Code 1- Verbalizes Understanding       Nutrition I: Fats: - Discuss the types of cholesterol, what cholesterol does to the body, and how cholesterol levels can be controlled. Flowsheet Row PULMONARY REHAB OTHER RESPIRATORY from 09/06/2022 in [place] [facility]  Date 08/17/22  Educator HB  Instruction Review Code 2- Demonstrated Understanding       Nutrition II: Labels: -Discuss the different components of food labels and how to read food labels. Flowsheet Row PULMONARY REHAB OTHER RESPIRATORY from 03/25/2020 in [place] [facility]  Date 02/26/20  Educator DJ  Instruction Review Code 1- Verbalizes Understanding       Respiratory Infections: - Discuss the signs and symptoms of respiratory infections, ways to prevent respiratory infections, and the importance of seeking medical treatment when having a respiratory infection. Flowsheet Row PULMONARY REHAB OTHER RESPIRATORY from 03/25/2020 in [place] [facility]  Date 03/04/20  Educator Handout  Instruction Review Code 1- Verbalizes Understanding       Stress I: Signs and Symptoms: - Discuss the causes of stress, how stress may lead to anxiety and depression, and ways to limit stress. Flowsheet Row PULMONARY REHAB OTHER RESPIRATORY from 03/25/2020 in [place] [facility]  Date 03/11/20  Educator [facility]  Instruction Review Code 1- Verbalizes Understanding       Stress II: Relaxation: -Discuss relaxation techniques to limit stress. Flowsheet Row PULMONARY REHAB OTHER RESPIRATORY from 03/25/2020 in [place] [facility]  Date 03/18/20  Educator [facility]  Instruction Review Code 1- Verbalizes Understanding       Oxygen for Home/Travel: - Discuss how to prepare for travel when on oxygen and proper ways to  transport and store oxygen to ensure safety. Flowsheet Row PULMONARY REHAB OTHER RESPIRATORY from 03/25/2020 in [place] [facility]  Date 03/25/20  Educator DF  Instruction Review Code 2- Demonstrated Understanding       Knowledge Questionnaire Score:  Knowledge Questionnaire Score - 07/18/22 1100       Knowledge Questionnaire Score   Pre Score 12/18             Core Components/Risk Factors/Patient Goals at Admission:  Personal Goals and Risk Factors at Admission - 07/18/22 1100       Core Components/Risk Factors/Patient Goals on Admission    Weight Management Yes;Weight Loss    Intervention Weight Management: Develop a combined nutrition and exercise program designed to reach desired caloric intake, while maintaining appropriate intake of nutrient and fiber, sodium and fats, and appropriate energy expenditure required for the weight goal.;Weight Management: Provide education and appropriate resources to help participant work on and attain dietary goals.;Weight Management/Obesity: Establish reasonable short term and long term weight goals.    Admit Weight 172 lb 1.6 oz (78.1 kg)    Goal Weight: Short Term 170 lb (77.1 kg)    Goal Weight: Long Term 170 lb (77.1 kg)    Expected Outcomes Short Term: Continue to assess and modify interventions until short term weight is achieved;Long Term: Adherence to nutrition and physical activity/exercise program aimed toward attainment of established weight goal;Weight Loss: Understanding of general recommendations for a balanced deficit meal plan, which promotes 1-2 lb weight loss per week and includes a negative energy balance of [phone] kcal/d;Understanding recommendations for meals to include 15-35% energy as protein, 25-35% energy from fat, 35-60% energy from carbohydrates, less than 200mg  of dietary cholesterol, 20-35 gm of total fiber daily;Understanding of distribution of calorie intake throughout the day with the consumption of  4-5 meals/snacks    Tobacco Cessation Yes    Number of packs per day 3 per day (1 pack per week)    Intervention Assist the participant in steps to quit. Provide individualized education and counseling about committing to Tobacco Cessation, relapse prevention, and pharmacological support that  can be provided by physician.;Education officer, environmental, assist with locating and accessing local/national Quit Smoking programs, and support quit date choice.    Expected Outcomes Short Term: Will demonstrate readiness to quit, by selecting a quit date.;Short Term: Will quit all tobacco product use, adhering to prevention of relapse plan.;Long Term: Complete abstinence from all tobacco products for at least 12 months from quit date.    Improve shortness of breath with ADL's Yes    Intervention Provide education, individualized exercise plan and daily activity instruction to help decrease symptoms of SOB with activities of daily living.    Expected Outcomes Short Term: Improve cardiorespiratory fitness to achieve a reduction of symptoms when performing ADLs;Long Term: Be able to perform more ADLs without symptoms or delay the onset of symptoms    Increase knowledge of respiratory medications and ability to use respiratory devices properly  Yes    Intervention Provide education and demonstration as needed of appropriate use of medications, inhalers, and oxygen therapy.    Expected Outcomes Long Term: Maintain appropriate use of medications, inhalers, and oxygen therapy.;Short Term: Achieves understanding of medications use. Understands that oxygen is a medication prescribed by physician. Demonstrates appropriate use of inhaler and oxygen therapy.    Hypertension Yes    Intervention Provide education on lifestyle modifcations including regular physical activity/exercise, weight management, moderate sodium restriction and increased consumption of fresh fruit, vegetables, and low fat dairy, alcohol moderation, and  smoking cessation.;Monitor prescription use compliance.    Expected Outcomes Long Term: Maintenance of blood pressure at goal levels.;Short Term: Continued assessment and intervention until BP is < 140/5mm HG in hypertensive participants. < 130/21mm HG in hypertensive participants with diabetes, heart failure or chronic kidney disease.    Lipids Yes    Intervention Provide education and support for participant on nutrition & aerobic/resistive exercise along with prescribed medications to achieve LDL 70mg , HDL >40mg .    Expected Outcomes Long Term: Cholesterol controlled with medications as prescribed, with individualized exercise RX and with personalized nutrition plan. Value goals: LDL < 70mg , HDL > 40 mg.;Short Term: Participant states understanding of desired cholesterol values and is compliant with medications prescribed. Participant is following exercise prescription and nutrition guidelines.             Core Components/Risk Factors/Patient Goals Review:   Goals and Risk Factor Review     Row Name 07/18/22 1110 08/03/22 1117 08/29/22 1059         Core Components/Risk Factors/Patient Goals Review   Personal Goals Review Tobacco Cessation Weight Management/Obesity;Hypertension;Lipids;Improve shortness of breath with ADL's;Tobacco Cessation Weight Management/Obesity;Hypertension;Lipids;Improve shortness of breath with ADL's;Tobacco Cessation;Increase knowledge of respiratory medications and ability to use respiratory devices properly.     Review Pt has worked from 1 ppd down to 3 cigarettes a day.  He would like to continue to work towards quitting.  He no longer takes them out of the house so is already making strides.  He was commended for his success already. Pt is still smoking 3-4 cigarretes a day.  He would like to try stop smoking and has been using a nicotine filter to try to help.  He stated that he has tried multiple methods to stop smoking and none have worked yet.  Pt's weight  has increased slightly from his admission weight. Trevor Key is doing well in rehab.  His weight has been steadily increasing as he is trying to quit smoking.  We talked about how most people will replace cigarettes with food as they are  trying to quit.  He has been doing a lot of late night snacking.  He is still working on quitting smoking.  Thus far today, he has not had one.  He is trying to work on delaying his cravings til lunch. He is also got some more filters and paches to help as well.  He hasn't tried filter yet, but wants to try. His pressures are doing well in class but he has been getting high readings at home.  He was encouraged to bring his cuff in to check against [place].  His breathing is getting better.  He keeps his inhaler on him but has not needed to use it recently.     Expected Outcomes Short: Continue to work towards quitting completely  Long: Complete cessation Short term:  Continue to work towards quitting smoking  Long term:  Complete cessation and weight loss Short: Bring in blood pressure cuff Long: conitnue to work on cessation.              Core Components/Risk Factors/Patient Goals at Discharge (Final Review):   Goals and Risk Factor Review - 08/29/22 1059       Core Components/Risk Factors/Patient Goals Review   Personal Goals Review Weight Management/Obesity;Hypertension;Lipids;Improve shortness of breath with ADL's;Tobacco Cessation;Increase knowledge of respiratory medications and ability to use respiratory devices properly.    Review Trevor Key is doing well in rehab.  His weight has been steadily increasing as he is trying to quit smoking.  We talked about how most people will replace cigarettes with food as they are trying to quit.  He has been doing a lot of late night snacking.  He is still working on quitting smoking.  Thus far today, he has not had one.  He is trying to work on delaying his cravings til lunch. He is also got some more filters and paches to help as well.   He hasn't tried filter yet, but wants to try. His pressures are doing well in class but he has been getting high readings at home.  He was encouraged to bring his cuff in to check against [place].  His breathing is getting better.  He keeps his inhaler on him but has not needed to use it recently.    Expected Outcomes Short: Bring in blood pressure cuff Long: conitnue to work on cessation.             ITP Comments:  ITP Comments     Row Name 07/18/22 0941 07/19/22 1324 07/25/22 1052 08/16/22 1202 09/13/22 0826   ITP Comments Patient attend orientation today.  Patient is attendingPulmonary Rehabilitation Program.  Documentation for diagnosis can be found in [place] notes 03/28/22 and 07/12/22.  Reviewed medical chart, RPE/RPD, gym safety, and program guidelines.  Patient was fitted to equipment they will be using during rehab.  Patient is scheduled to start exercise on 07/20/22 at 1045. 30 day review completed. ITP sent to Dr.Jehanzeb Memon, Medical Director of  Pulmonary Rehab. Continue with ITP unless changes are made by physician.  Pt is still brand new, has yet to start exercise. First full day of exercise!  Patient was oriented to gym and equipment including functions, settings, policies, and procedures.  Patient's individual exercise prescription and treatment plan were reviewed.  All starting workloads were established based on the results of the 6 minute walk test done at initial orientation visit.  The plan for exercise progression was also introduced and progression will be customized based on patient's performance and goals.  30 day review completed. ITP sent to Dr.Jehanzeb Memon, Medical Director of  Pulmonary Rehab. Continue with ITP unless changes are made by physician. New to program 30 day review completed. ITP sent to Dr.Jehanzeb Memon, Medical Director of  Pulmonary Rehab. Continue with ITP unless changes are made by physician.            Comments: 30 day review

## 2022-09-14 ENCOUNTER — Encounter (HOSPITAL_COMMUNITY): Payer: No Typology Code available for payment source

## 2022-09-15 ENCOUNTER — Encounter (HOSPITAL_COMMUNITY)
Admission: RE | Admit: 2022-09-15 | Discharge: 2022-09-15 | Disposition: A | Discharge status: Home / Self Care | Payer: No Typology Code available for payment source | Source: Ambulatory Visit | Attending: Registered Nurse | Admitting: Registered Nurse

## 2022-09-15 DIAGNOSIS — R0609 Other forms of dyspnea: Secondary | ICD-10-CM

## 2022-09-15 DIAGNOSIS — J449 Chronic obstructive pulmonary disease, unspecified: Secondary | ICD-10-CM | POA: Diagnosis not present

## 2022-09-15 NOTE — Progress Notes (Signed)
Daily Session Note  Patient Details  Name: Trevor Key MRN: [id number] Date of Birth: Sep 14, 1955 Referring Provider:   Flowsheet Row PULMONARY REHAB COPD ORIENTATION from 07/18/2022 in [facility] CARDIAC REHABILITATION  Referring Provider Vito Berger, NP  [Dr. Pernell Dupre (attending MD)]       Encounter Date: 09/15/2022  Check In:  Session Check In - 09/15/22 1100       Check-In   Supervising physician immediately available to respond to emergencies See telemetry face sheet for immediately available MD    Location [facility]    Staff Present Ross Ludwig, BS, Exercise Physiologist;Debra Laural Benes, RN, Pleas Koch, RN, BSN;Jessica Hawkins, MA, RCEP, CCRP, CCET    Virtual Visit No    Medication changes reported     No    Fall or balance concerns reported    No    Tobacco Cessation No Change    Current number of cigarettes/nicotine per day     3    Warm-up and Cool-down Performed on first and last piece of equipment    Resistance Training Performed Yes    VAD Patient? No    PAD/SET Patient? No      Pain Assessment   Currently in Pain? No/denies    Multiple Pain Sites No             Capillary Blood Glucose: No results found for this or any previous visit (from the past 24 hour(s)).    Social History   Tobacco Use  Smoking Status Every Day   Current packs/day: 0.25   Average packs/day: 0.3 packs/day for 40.0 years (10.0 ttl pk-yrs)   Types: Cigarettes  Smokeless Tobacco Never  Tobacco Comments   He is currently enrolled in a smoking cessation program through the [place]. He reports that it is working.    Goals Met:  Independence with exercise equipment Exercise tolerated well No report of concerns or symptoms today Strength training completed today  Goals Unmet:  Not Applicable  Comments: Pt able to follow exercise prescription today without complaint.  Will continue to monitor for progression.    Dr. Erick Blinks is  Medical Director for [facility] Pulmonary Rehab.

## 2022-09-18 ENCOUNTER — Encounter (HOSPITAL_COMMUNITY)
Admission: RE | Admit: 2022-09-18 | Discharge: 2022-09-18 | Disposition: A | Discharge status: Home / Self Care | Payer: No Typology Code available for payment source | Source: Ambulatory Visit | Attending: Registered Nurse | Admitting: Registered Nurse

## 2022-09-18 DIAGNOSIS — R0609 Other forms of dyspnea: Secondary | ICD-10-CM

## 2022-09-18 DIAGNOSIS — J449 Chronic obstructive pulmonary disease, unspecified: Secondary | ICD-10-CM

## 2022-09-18 NOTE — Progress Notes (Signed)
Daily Session Note  Patient Details  Name: Trevor Key MRN: [id number] Date of Birth: 02/28/1955 Referring Provider:   Flowsheet Row PULMONARY REHAB COPD ORIENTATION from 07/18/2022 in [facility] CARDIAC REHABILITATION  Referring Provider Vito Berger, NP  [Dr. Pernell Dupre (attending MD)]       Encounter Date: 09/18/2022  Check In:  Session Check In - 09/18/22 1100       Check-In   Supervising physician immediately available to respond to emergencies See telemetry face sheet for immediately available MD    Location [facility]    Staff Present Ross Ludwig, BS, Exercise Physiologist;Debra Laural Benes, RN, BSN;Ura Yingling, RN;Jessica [place], MA, RCEP, CCRP, CCET    Virtual Visit No    Medication changes reported     No    Fall or balance concerns reported    No    Tobacco Cessation No Change    Current number of cigarettes/nicotine per day     3    Warm-up and Cool-down Performed on first and last piece of equipment    Resistance Training Performed Yes    VAD Patient? No    PAD/SET Patient? No      Pain Assessment   Currently in Pain? No/denies    Multiple Pain Sites No             Capillary Blood Glucose: No results found for this or any previous visit (from the past 24 hour(s)).    Social History   Tobacco Use  Smoking Status Every Day   Current packs/day: 0.25   Average packs/day: 0.3 packs/day for 40.0 years (10.0 ttl pk-yrs)   Types: Cigarettes  Smokeless Tobacco Never  Tobacco Comments   He is currently enrolled in a smoking cessation program through the [place]. He reports that it is working.    Goals Met:  Independence with exercise equipment Exercise tolerated well No report of concerns or symptoms today  Goals Unmet:  Not Applicable  Comments: Pt able to follow exercise prescription today without complaint.  Will continue to monitor for progression.    Dr. Dina Rich is Medical Director for [facility] Cardiac  Rehab

## 2022-09-19 ENCOUNTER — Encounter (HOSPITAL_COMMUNITY): Payer: No Typology Code available for payment source

## 2022-09-20 ENCOUNTER — Encounter (HOSPITAL_COMMUNITY): Payer: No Typology Code available for payment source

## 2022-09-21 ENCOUNTER — Encounter (HOSPITAL_COMMUNITY): Payer: No Typology Code available for payment source

## 2022-09-22 ENCOUNTER — Encounter (HOSPITAL_COMMUNITY)
Admission: RE | Admit: 2022-09-22 | Discharge: 2022-09-22 | Disposition: A | Discharge status: Home / Self Care | Payer: No Typology Code available for payment source | Source: Ambulatory Visit | Attending: Registered Nurse | Admitting: Registered Nurse

## 2022-09-22 DIAGNOSIS — R0609 Other forms of dyspnea: Secondary | ICD-10-CM

## 2022-09-22 DIAGNOSIS — J449 Chronic obstructive pulmonary disease, unspecified: Secondary | ICD-10-CM | POA: Diagnosis not present

## 2022-09-22 NOTE — Progress Notes (Deleted)
Daily Session Note  Patient Details  Name: Trevor Key MRN: [id number] Date of Birth: 11-19-1955 Referring Provider:   Flowsheet Row PULMONARY REHAB COPD ORIENTATION from 07/18/2022 in [facility] CARDIAC REHABILITATION  Referring Provider Vito Berger, NP  [Dr. Pernell Dupre (attending MD)]       Encounter Date: 09/22/2022  Check In:  Session Check In - 09/22/22 1045       Check-In   Supervising physician immediately available to respond to emergencies See telemetry face sheet for immediately available ER MD    Location [facility]    Staff Present Staci Righter, RN, Neal Dy, RN, BSN;Jessica Juanetta Gosling, MA, RCEP, CCRP, CCET;Heather Fredric Mare, [place], Exercise Physiologist    Virtual Visit No    Medication changes reported     No    Fall or balance concerns reported    No    Tobacco Cessation No Change    Warm-up and Cool-down Performed on first and last piece of equipment    Resistance Training Performed Yes    VAD Patient? No    PAD/SET Patient? No      Pain Assessment   Currently in Pain? No/denies    Multiple Pain Sites No             Capillary Blood Glucose: No results found for this or any previous visit (from the past 24 hour(s)).    Social History   Tobacco Use  Smoking Status Every Day   Current packs/day: 0.25   Average packs/day: 0.3 packs/day for 40.0 years (10.0 ttl pk-yrs)   Types: Cigarettes  Smokeless Tobacco Never  Tobacco Comments   He is currently enrolled in a smoking cessation program through the [place]. He reports that it is working.    Goals Met:  Independence with exercise equipment Exercise tolerated well No report of concerns or symptoms today Strength training completed today  Goals Unmet:  Not Applicable  Comments: Pt able to follow exercise prescription today without complaint.  Will continue to monitor for progression.    Dr. Dina Rich is Medical Director for [facility] Cardiac Rehab

## 2022-09-22 NOTE — Progress Notes (Signed)
Daily Session Note  Patient Details  Name: Trevor Key MRN: [id number] Date of Birth: 06-Sep-1955 Referring Provider:   Flowsheet Row PULMONARY REHAB COPD ORIENTATION from 07/18/2022 in [facility] CARDIAC REHABILITATION  Referring Provider Vito Berger, NP  [Dr. Pernell Dupre (attending MD)]       Encounter Date: 09/22/2022  Check In:  Session Check In - 09/22/22 1045       Check-In   Supervising physician immediately available to respond to emergencies See telemetry face sheet for immediately available ER MD    Location [facility]    Staff Present Staci Righter, RN, Neal Dy, RN, BSN;Jessica Juanetta Gosling, MA, RCEP, CCRP, CCET;Heather Fredric Mare, [place], Exercise Physiologist    Virtual Visit No    Medication changes reported     No    Fall or balance concerns reported    No    Tobacco Cessation No Change    Warm-up and Cool-down Performed on first and last piece of equipment    Resistance Training Performed Yes    VAD Patient? No    PAD/SET Patient? No      Pain Assessment   Currently in Pain? No/denies    Multiple Pain Sites No             Capillary Blood Glucose: No results found for this or any previous visit (from the past 24 hour(s)).    Social History   Tobacco Use  Smoking Status Every Day   Current packs/day: 0.25   Average packs/day: 0.3 packs/day for 40.0 years (10.0 ttl pk-yrs)   Types: Cigarettes  Smokeless Tobacco Never  Tobacco Comments   He is currently enrolled in a smoking cessation program through the [place]. He reports that it is working.    Goals Met:  Proper associated with RPD/PD & O2 Sat Independence with exercise equipment Using PLB without cueing & demonstrates good technique Personal goals reviewed No report of concerns or symptoms today Strength training completed today  Goals Unmet:  Not Applicable  Comments: Pt able to follow exercise prescription today without complaint.  Will continue to monitor for  progression.    Dr. Erick Blinks is Medical Director for [facility] Pulmonary Rehab.

## 2022-09-25 ENCOUNTER — Encounter (HOSPITAL_COMMUNITY): Payer: No Typology Code available for payment source

## 2022-09-26 ENCOUNTER — Encounter (HOSPITAL_COMMUNITY): Payer: No Typology Code available for payment source

## 2022-09-27 ENCOUNTER — Encounter (HOSPITAL_COMMUNITY)
Admission: RE | Admit: 2022-09-27 | Discharge: 2022-09-27 | Disposition: A | Discharge status: Home / Self Care | Payer: No Typology Code available for payment source | Source: Ambulatory Visit | Attending: Registered Nurse | Admitting: Registered Nurse

## 2022-09-27 DIAGNOSIS — J449 Chronic obstructive pulmonary disease, unspecified: Secondary | ICD-10-CM | POA: Diagnosis not present

## 2022-09-27 DIAGNOSIS — R0609 Other forms of dyspnea: Secondary | ICD-10-CM

## 2022-09-27 NOTE — Progress Notes (Signed)
Daily Session Note  Patient Details  Name: Trevor Key MRN: [id number] Date of Birth: 08-Feb-1955 Referring Provider:   Flowsheet Row PULMONARY REHAB COPD ORIENTATION from 07/18/2022 in [facility] CARDIAC REHABILITATION  Referring Provider Vito Berger, NP  [Dr. Pernell Dupre (attending MD)]       Encounter Date: 09/27/2022  Check In:  Session Check In - 09/27/22 1041       Check-In   Supervising physician immediately available to respond to emergencies See telemetry face sheet for immediately available MD    Location [facility]    Staff Present Ross Ludwig, BS, Exercise Physiologist;Jessica Juanetta Gosling, MA, RCEP, CCRP, CCET;Hillary Troutman BSN, RN    Virtual Visit No    Medication changes reported     No    Fall or balance concerns reported    No    Tobacco Cessation No Change    Current number of cigarettes/nicotine per day     3    Warm-up and Cool-down Performed on first and last piece of equipment    Resistance Training Performed Yes    VAD Patient? No    PAD/SET Patient? No      Pain Assessment   Currently in Pain? No/denies    Multiple Pain Sites No             Capillary Blood Glucose: No results found for this or any previous visit (from the past 24 hour(s)).    Social History   Tobacco Use  Smoking Status Every Day   Current packs/day: 0.25   Average packs/day: 0.3 packs/day for 40.0 years (10.0 ttl pk-yrs)   Types: Cigarettes  Smokeless Tobacco Never  Tobacco Comments   He is currently enrolled in a smoking cessation program through the [place]. He reports that it is working.    Goals Met:  Independence with exercise equipment Exercise tolerated well No report of concerns or symptoms today Strength training completed today  Goals Unmet:  Not Applicable  Comments: Pt able to follow exercise prescription today without complaint.  Will continue to monitor for progression.    Dr. Erick Blinks is Medical Director for [facility] Pulmonary Rehab.

## 2022-09-28 ENCOUNTER — Encounter (HOSPITAL_COMMUNITY): Payer: No Typology Code available for payment source

## 2022-09-29 ENCOUNTER — Encounter (HOSPITAL_COMMUNITY)
Admission: RE | Admit: 2022-09-29 | Discharge: 2022-09-29 | Disposition: A | Discharge status: Home / Self Care | Payer: No Typology Code available for payment source | Source: Ambulatory Visit | Attending: Registered Nurse | Admitting: Registered Nurse

## 2022-09-29 DIAGNOSIS — J449 Chronic obstructive pulmonary disease, unspecified: Secondary | ICD-10-CM

## 2022-09-29 DIAGNOSIS — R0609 Other forms of dyspnea: Secondary | ICD-10-CM

## 2022-09-29 NOTE — Progress Notes (Signed)
Daily Session Note  Patient Details  Name: Trevor Key MRN: [id number] Date of Birth: October 12, 1955 Referring Provider:   Flowsheet Row PULMONARY REHAB COPD ORIENTATION from 07/18/2022 in [facility] CARDIAC REHABILITATION  Referring Provider Vito Berger, NP  [Dr. Pernell Dupre (attending MD)]       Encounter Date: 09/29/2022  Check In:  Session Check In - 09/29/22 1100       Check-In   Supervising physician immediately available to respond to emergencies See telemetry face sheet for immediately available MD    Location [facility]    Staff Present Ross Ludwig, BS, Exercise Physiologist;Debra Laural Benes, RN, BSN;Jessica Hawkins, MA, RCEP, CCRP, CCET    Virtual Visit No    Medication changes reported     No    Fall or balance concerns reported    No    Tobacco Cessation No Change    Current number of cigarettes/nicotine per day     3    Warm-up and Cool-down Performed on first and last piece of equipment    VAD Patient? No    PAD/SET Patient? No      Pain Assessment   Currently in Pain? No/denies    Multiple Pain Sites No             Capillary Blood Glucose: No results found for this or any previous visit (from the past 24 hour(s)).    Social History   Tobacco Use  Smoking Status Every Day   Current packs/day: 0.25   Average packs/day: 0.3 packs/day for 40.0 years (10.0 ttl pk-yrs)   Types: Cigarettes  Smokeless Tobacco Never  Tobacco Comments   He is currently enrolled in a smoking cessation program through the [place]. He reports that it is working.    Goals Met:  Independence with exercise equipment Exercise tolerated well No report of concerns or symptoms today Strength training completed today  Goals Unmet:  Not Applicable  Comments: Pt able to follow exercise prescription today without complaint.  Will continue to monitor for progression.    Dr. Erick Blinks is Medical Director for [facility] Pulmonary Rehab.

## 2022-10-02 ENCOUNTER — Encounter (HOSPITAL_COMMUNITY): Payer: No Typology Code available for payment source

## 2022-10-03 ENCOUNTER — Encounter (HOSPITAL_COMMUNITY): Payer: No Typology Code available for payment source

## 2022-10-04 ENCOUNTER — Encounter (HOSPITAL_COMMUNITY): Payer: No Typology Code available for payment source

## 2022-10-05 ENCOUNTER — Encounter (HOSPITAL_COMMUNITY): Payer: No Typology Code available for payment source

## 2022-10-06 ENCOUNTER — Encounter (HOSPITAL_COMMUNITY): Payer: No Typology Code available for payment source | Attending: Registered Nurse

## 2022-10-06 DIAGNOSIS — J449 Chronic obstructive pulmonary disease, unspecified: Secondary | ICD-10-CM | POA: Insufficient documentation

## 2022-10-06 DIAGNOSIS — R0609 Other forms of dyspnea: Secondary | ICD-10-CM | POA: Insufficient documentation

## 2022-10-09 ENCOUNTER — Encounter (HOSPITAL_COMMUNITY): Payer: No Typology Code available for payment source

## 2022-10-10 ENCOUNTER — Encounter (HOSPITAL_COMMUNITY): Payer: No Typology Code available for payment source

## 2022-10-11 ENCOUNTER — Encounter (HOSPITAL_COMMUNITY): Payer: Self-pay | Admitting: *Deleted

## 2022-10-11 ENCOUNTER — Encounter (HOSPITAL_COMMUNITY): Payer: No Typology Code available for payment source

## 2022-10-11 DIAGNOSIS — J449 Chronic obstructive pulmonary disease, unspecified: Secondary | ICD-10-CM

## 2022-10-11 DIAGNOSIS — R0609 Other forms of dyspnea: Secondary | ICD-10-CM

## 2022-10-11 NOTE — Progress Notes (Signed)
Pulmonary Individual Treatment Plan  Patient Details  Name: Trevor Key MRN: [id number] Date of Birth: [birth year] Referring Provider:   Flowsheet Row PULMONARY REHAB COPD ORIENTATION from 07/18/2022 in [facility] CARDIAC REHABILITATION  Referring Provider Vito Berger, NP  [Dr. Pernell Dupre (attending MD)]       Initial Encounter Date:  Flowsheet Row PULMONARY REHAB COPD ORIENTATION from 07/18/2022 in [facility]  Date 07/18/22       Visit Diagnosis: Dyspnea on exertion  Chronic obstructive pulmonary disease, unspecified COPD type (HCC)  Patient's Home Medications on Admission:   Current Outpatient Medications:    albuterol (PROVENTIL HFA;VENTOLIN HFA) 108 (90 BASE) MCG/ACT inhaler, Inhale 1-2 puffs into the lungs every 6 (six) hours as needed for wheezing., Disp: 1 Inhaler, Rfl: 0   aspirin 81 MG chewable tablet, Chew 81 mg by mouth daily., Disp: , Rfl:    atorvastatin (LIPITOR) 80 MG tablet, Take 80 mg by mouth daily., Disp: , Rfl:    cholecalciferol (VITAMIN D3) 25 MCG (1000 UNIT) tablet, Take 1,000 Units by mouth daily., Disp: , Rfl:    losartan (COZAAR) 100 MG tablet, Take 100 mg by mouth daily., Disp: , Rfl:    metoprolol succinate (TOPROL-XL) 50 MG 24 hr tablet, Take 50 mg by mouth daily. Take with or immediately following a meal., Disp: , Rfl:    nitroGLYCERIN (NITROLINGUAL) 0.4 MG/SPRAY spray, Place 1 spray under the tongue every 5 (five) minutes as needed. For chest pain., Disp: , Rfl:    omeprazole (PRILOSEC) 20 MG capsule, Take 20 mg by mouth daily., Disp: , Rfl:    primidone (MYSOLINE) 50 MG tablet, Take 50 mg by mouth daily., Disp: , Rfl:    tiotropium (SPIRIVA) 18 MCG inhalation capsule, Place 18 mcg into inhaler and inhale daily., Disp: , Rfl:   Past Medical History: Past Medical History:  Diagnosis Date   Coronary atherosclerosis of native coronary artery    BMS LAD 1/13, LVEF 55-60%   Hypercholesteremia    Hypertension    MI  (myocardial infarction) (HCC)    AMI 1/13    Tobacco Use: Social History   Tobacco Use  Smoking Status Every Day   Current packs/day: 0.25   Average packs/day: 0.3 packs/day for 40.0 years (10.0 ttl pk-yrs)   Types: Cigarettes  Smokeless Tobacco Never  Tobacco Comments   He is currently enrolled in a smoking cessation program through the [place]. He reports that it is working.    Labs: Review Flowsheet       Latest Ref Rng & Units 03/27/2011 10/28/2011 05/19/2016 05/20/2016  Labs for ITP Cardiac and Pulmonary Rehab  Cholestrol 0 - 200 mg/dL 573  - - 220   LDL (calc) 0 - 99 mg/dL 66  - - 71   HDL-C >25 mg/dL 33  - - 33   Trlycerides <150 mg/dL 96  - - 93   Hemoglobin A1c 4.8 - 5.6 % - - - 5.9   TCO2 0 - 100 mmol/L - 26  28  -    Details            Capillary Blood Glucose: No results found for: "GLUCAP"   Pulmonary Assessment Scores:  Pulmonary Assessment Scores     Row Name 07/18/22 1123         ADL UCSD   ADL Phase Entry     SOB Score total 87     Rest 0     Walk 1  Stairs 5     Bath 3     Dress 3     Shop 5       CAT Score   CAT Score 22       mMRC Score   mMRC Score 1             UCSD: Self-administered rating of dyspnea associated with activities of daily living (ADLs) 6-point scale (0 = "not at all" to 5 = "maximal or unable to do because of breathlessness")  Scoring Scores range from 0 to 120.  Minimally important difference is 5 units  CAT: CAT can identify the health impairment of COPD patients and is better correlated with disease progression.  CAT has a scoring range of zero to 40. The CAT score is classified into four groups of low (less than 10), medium (10 - 20), high (21-30) and very high (31-40) based on the impact level of disease on health status. A CAT score over 10 suggests significant symptoms.  A worsening CAT score could be explained by an exacerbation, poor medication adherence, poor inhaler technique, or progression of COPD  or comorbid conditions.  CAT MCID is 2 points  mMRC: mMRC (Modified Medical Research Council) Dyspnea Scale is used to assess the degree of baseline functional disability in patients of respiratory disease due to dyspnea. No minimal important difference is established. A decrease in score of 1 point or greater is considered a positive change.   Pulmonary Function Assessment:  Pulmonary Function Assessment - 07/18/22 1126       Breath   Shortness of Breath Yes;Limiting activity;Fear of Shortness of Breath             Exercise Target Goals: Exercise Program Goal: Individual exercise prescription set using results from initial 6 min walk test and THRR while considering  patient's activity barriers and safety.   Exercise Prescription Goal: Initial exercise prescription builds to 30-45 minutes a day of aerobic activity, 2-3 days per week.  Home exercise guidelines will be given to patient during program as part of exercise prescription that the participant will acknowledge.  Activity Barriers & Risk Stratification:  Activity Barriers & Cardiac Risk Stratification - 07/18/22 0756       Activity Barriers & Cardiac Risk Stratification   Activity Barriers Arthritis;Joint Problems;History of Falls   R shoulder bothersome            6 Minute Walk:  6 Minute Walk     Row Name 07/18/22 0944         6 Minute Walk   Phase Initial     Distance 1374 feet     Walk Time 6 minutes     # of Rest Breaks 0     MPH 2.6     METS 3.28     RPE 12     Perceived Dyspnea  3     VO2 Peak 11.49     Symptoms Yes (comment)     Comments SOB, chest pain 3/10 at end     Resting HR 61 bpm     Resting BP 146/74     Resting Oxygen Saturation  97 %     Exercise Oxygen Saturation  during 6 min walk 95 %     Max Ex. HR 89 bpm     Max Ex. BP 176/64     2 Minute Post BP 132/64       Interval HR   1 Minute HR 65  2 Minute HR --  error     3 Minute HR --  error     4 Minute HR --  error      5 Minute HR --  error     6 Minute HR 89     2 Minute Post HR 69     Interval Heart Rate? Yes       Interval Oxygen   Interval Oxygen? Yes     Baseline Oxygen Saturation % 97 %     1 Minute Oxygen Saturation % 96 %     1 Minute Liters of Oxygen 0 L  Room Air     2 Minute Oxygen Saturation % 95 %     2 Minute Liters of Oxygen 0 L     3 Minute Oxygen Saturation % 95 %     3 Minute Liters of Oxygen 0 L     4 Minute Oxygen Saturation % 95 %     4 Minute Liters of Oxygen 0 L     5 Minute Oxygen Saturation % 95 %     5 Minute Liters of Oxygen 0 L     6 Minute Oxygen Saturation % 95 %     6 Minute Liters of Oxygen 0 L     2 Minute Post Oxygen Saturation % 96 %     2 Minute Post Liters of Oxygen 0 L              Oxygen Initial Assessment:  Oxygen Initial Assessment - 07/18/22 1121       Home Oxygen   Home Oxygen Device None    Sleep Oxygen Prescription CPAP    Liters per minute 0    Home Exercise Oxygen Prescription None    Home Resting Oxygen Prescription None    Compliance with Home Oxygen Use No   does not put back on after getting up to go to bathroom     Initial 6 min Walk   Oxygen Used None      Program Oxygen Prescription   Program Oxygen Prescription None      Intervention   Short Term Goals To learn and exhibit compliance with exercise, home and travel O2 prescription;To learn and understand importance of monitoring SPO2 with pulse oximeter and demonstrate accurate use of the pulse oximeter.;To learn and understand importance of maintaining oxygen saturations>88%;To learn and demonstrate proper pursed lip breathing techniques or other breathing techniques. ;To learn and demonstrate proper use of respiratory medications    Long  Term Goals Exhibits compliance with exercise, home  and travel O2 prescription;Verbalizes importance of monitoring SPO2 with pulse oximeter and return demonstration;Maintenance of O2 saturations>88%;Exhibits proper breathing techniques,  such as pursed lip breathing or other method taught during program session;Compliance with respiratory medication;Demonstrates proper use of MDI's             Oxygen Re-Evaluation:  Oxygen Re-Evaluation     Row Name 07/18/22 1122 08/03/22 1127 08/04/22 1232 08/29/22 1105 09/22/22 1135     Program Oxygen Prescription   Program Oxygen Prescription -- None -- None None     Home Oxygen   Home Oxygen Device -- None -- None None   Sleep Oxygen Prescription -- CPAP -- CPAP CPAP   Liters per minute -- --  pt is unsure of the oxygen concentration with his CPAP -- -- --   Home Exercise Oxygen Prescription -- None -- None None   Home Resting Oxygen Prescription -- None --  None None   Compliance with Home Oxygen Use -- No  pt does not put his CPAP back on after he gets up at night to go to the bathroom -- Yes  Only wears when first go to sleep No     Goals/Expected Outcomes   Short Term Goals To learn and demonstrate proper pursed lip breathing techniques or other breathing techniques. ;To learn and understand importance of maintaining oxygen saturations>88%;To learn and understand importance of monitoring SPO2 with pulse oximeter and demonstrate accurate use of the pulse oximeter. To learn and understand importance of maintaining oxygen saturations>88%;To learn and demonstrate proper pursed lip breathing techniques or other breathing techniques. ;To learn and demonstrate proper use of respiratory medications;To learn and understand importance of monitoring SPO2 with pulse oximeter and demonstrate accurate use of the pulse oximeter. To learn and demonstrate proper pursed lip breathing techniques or other breathing techniques.  To learn and understand importance of monitoring SPO2 with pulse oximeter and demonstrate accurate use of the pulse oximeter.;To learn and understand importance of maintaining oxygen saturations>88%;To learn and demonstrate proper pursed lip breathing techniques or other breathing  techniques. ;To learn and demonstrate proper use of respiratory medications To learn and understand importance of monitoring SPO2 with pulse oximeter and demonstrate accurate use of the pulse oximeter.;To learn and understand importance of maintaining oxygen saturations>88%;To learn and demonstrate proper pursed lip breathing techniques or other breathing techniques. ;To learn and demonstrate proper use of respiratory medications   Long  Term Goals Exhibits proper breathing techniques, such as pursed lip breathing or other method taught during program session;Maintenance of O2 saturations>88%;Verbalizes importance of monitoring SPO2 with pulse oximeter and return demonstration Exhibits proper breathing techniques, such as pursed lip breathing or other method taught during program session;Maintenance of O2 saturations>88%;Verbalizes importance of monitoring SPO2 with pulse oximeter and return demonstration;Compliance with respiratory medication Exhibits proper breathing techniques, such as pursed lip breathing or other method taught during program session Verbalizes importance of monitoring SPO2 with pulse oximeter and return demonstration;Maintenance of O2 saturations>88%;Exhibits proper breathing techniques, such as pursed lip breathing or other method taught during program session;Compliance with respiratory medication Verbalizes importance of monitoring SPO2 with pulse oximeter and return demonstration;Maintenance of O2 saturations>88%;Exhibits proper breathing techniques, such as pursed lip breathing or other method taught during program session;Compliance with respiratory medication   Comments Reviewed PLB technique with pt.  Talked about how it works and it's importance in maintaining their exercise saturations. Pt is using PLB at times and using his inhalers as prescribed. Diaphragmatic and PLB breathing explained and performed with patient. Patient has a better understanding of how to do these exercises to  help with breathing performance and relaxation. Patient performed breathing techniques adequately and to practice further at home. Rohan is doing well in rehab.  He is having a hard time with his CPAP at night. When he first goes to bed it works fine, but once he wakes at night, he has a hard time getting it back on properly.  He was encouraged to keep trying to get it back on properly when he takes it off.  He has ordered a pulse oximeter that should arrive this week.  He is feeling better with his breathing and feels like the PLB is working.  He has not needed his inhalers recently and feeling better. Robet continues to have a hard time with his CPAP.  He wants to talk to his doctor about it.  He is also scheduled for a lung resection  on 10/02/22.  He is doing well on his meds.  He will also talk to his doctor about rehab.  He still hasn't gotten the pulse oximeter from the [place].   Goals/Expected Outcomes Short: Become more profiecient at using PLB.   Long: Become independent at using PLB. Short term:  Become more proficient at using PLB and inquire about a home pulse ox  Long term:  Continue compliance with resp medications Short: practice PLB and diaphragmatic breathing at home. Long: Use PLB and diaphragmatic breathing independently Short: Start using pulse oximeter Long: continue to work on using CPAP Short: Ask about pulse oximeter again and rehab Long: Continue to work on breathin g            Oxygen Discharge (Final Oxygen Re-Evaluation):  Oxygen Re-Evaluation - 09/22/22 1135       Program Oxygen Prescription   Program Oxygen Prescription None      Home Oxygen   Home Oxygen Device None    Sleep Oxygen Prescription CPAP    Home Exercise Oxygen Prescription None    Home Resting Oxygen Prescription None    Compliance with Home Oxygen Use No      Goals/Expected Outcomes   Short Term Goals To learn and understand importance of monitoring SPO2 with pulse oximeter and demonstrate accurate use  of the pulse oximeter.;To learn and understand importance of maintaining oxygen saturations>88%;To learn and demonstrate proper pursed lip breathing techniques or other breathing techniques. ;To learn and demonstrate proper use of respiratory medications    Long  Term Goals Verbalizes importance of monitoring SPO2 with pulse oximeter and return demonstration;Maintenance of O2 saturations>88%;Exhibits proper breathing techniques, such as pursed lip breathing or other method taught during program session;Compliance with respiratory medication    Comments Robet continues to have a hard time with his CPAP.  He wants to talk to his doctor about it.  He is also scheduled for a lung resection on 10/02/22.  He is doing well on his meds.  He will also talk to his doctor about rehab.  He still hasn't gotten the pulse oximeter from the [place].    Goals/Expected Outcomes Short: Ask about pulse oximeter again and rehab Long: Continue to work on breathin g             Initial Exercise Prescription:  Initial Exercise Prescription - 07/18/22 0900       Date of Initial Exercise RX and Referring Provider   Date 07/18/22    Referring Provider Vito Berger, NP   Dr. Pernell Dupre (attending MD)     Oxygen   Maintain Oxygen Saturation 88% or higher      Treadmill   MPH 2.5    Grade 0.5    Minutes 15    METs 3.09      REL-XR   Level 3    Speed 50    Minutes 15    METs 3.2      Prescription Details   Frequency (times per week) 2    Duration Progress to 30 minutes of continuous aerobic without signs/symptoms of physical distress      Intensity   THRR 40-80% of Max Heartrate 98-135    Ratings of Perceived Exertion 11-13    Perceived Dyspnea 0-4      Progression   Progression Continue to progress workloads to maintain intensity without signs/symptoms of physical distress.      Resistance Training   Training Prescription Yes    Weight 5 lb  Reps 10-15             Perform Capillary Blood  Glucose checks as needed.  Exercise Prescription Changes:   Exercise Prescription Changes     Row Name 07/18/22 0900 08/01/22 1200 08/15/22 1200 08/29/22 1000 09/08/22 1500     Response to Exercise   Blood Pressure (Admit) 146/74 138/70 154/80 -- 156/70   Blood Pressure (Exercise) 176/64 144/80 142/80 -- --   Blood Pressure (Exit) 132/64 134/62 128/62 -- 130/74   Heart Rate (Admit) 61 bpm 62 bpm 69 bpm -- 64 bpm   Heart Rate (Exercise) 89 bpm 87 bpm 85 bpm -- 89 bpm   Heart Rate (Exit) 72 bpm 56 bpm 66 bpm -- 65 bpm   Oxygen Saturation (Admit) 97 % 97 % 98 % -- 98 %   Oxygen Saturation (Exercise) 95 % 99 % 98 % -- 97 %   Oxygen Saturation (Exit) 97 % 98 % 99 % -- 99 %   Rating of Perceived Exertion (Exercise) 12 12 13  -- 15   Perceived Dyspnea (Exercise) 3 2 3  -- 3   Symptoms SOB, chest pain (3/10) -- -- -- --   Comments walk test results -- -- -- --   Duration -- Continue with 30 min of aerobic exercise without signs/symptoms of physical distress. Continue with 30 min of aerobic exercise without signs/symptoms of physical distress. -- Continue with 30 min of aerobic exercise without signs/symptoms of physical distress.   Intensity -- THRR unchanged THRR unchanged -- THRR unchanged     Progression   Progression -- Continue to progress workloads to maintain intensity without signs/symptoms of physical distress. Continue to progress workloads to maintain intensity without signs/symptoms of physical distress. -- Continue to progress workloads to maintain intensity without signs/symptoms of physical distress.     Resistance Training   Training Prescription -- Yes Yes -- Yes   Weight -- 5 5 -- 4   Reps -- 10-15 10-15 -- 10-15     Treadmill   MPH -- 2.5 2.5 -- 2.5   Grade -- 0.5 0.5 -- 0.5   Minutes -- 15 15 -- 15   METs -- 3.09 3.09 -- 3.09     REL-XR   Level -- 1 2 -- 1   Speed -- 44 45 -- 45   Minutes -- 15 15 -- 15   METs -- 2.3 1.8 -- 2.8     Home Exercise Plan   Plans to  continue exercise at -- -- -- Home (comment)  walking, [employer] [facility] (comment)   Frequency -- -- -- Add 3 additional days to program exercise sessions. Add 3 additional days to program exercise sessions.   Initial Home Exercises Provided -- -- -- 08/29/22 --     Oxygen   Maintain Oxygen Saturation -- 88% or higher 88% or higher -- 88% or higher    Row Name 09/27/22 1200             Response to Exercise   Blood Pressure (Admit) 120/80       Blood Pressure (Exit) 112/64       Heart Rate (Admit) 68 bpm       Heart Rate (Exercise) 93 bpm       Heart Rate (Exit) 74 bpm       Oxygen Saturation (Admit) 99 %       Oxygen Saturation (Exercise) 96 %       Oxygen Saturation (Exit) 99 %  Rating of Perceived Exertion (Exercise) 15       Perceived Dyspnea (Exercise) 2       Duration Continue with 30 min of aerobic exercise without signs/symptoms of physical distress.       Intensity THRR unchanged         Progression   Progression Continue to progress workloads to maintain intensity without signs/symptoms of physical distress.         Resistance Training   Training Prescription Yes       Weight 5 lbs       Reps 10-15         Treadmill   MPH 2.5       Grade 0.5       Minutes 15       METs 3.09         REL-XR   Level 4       Speed 54       Minutes 15       METs 2.9         Home Exercise Plan   Plans to continue exercise at Home (comment)       Frequency Add 3 additional days to program exercise sessions.         Oxygen   Maintain Oxygen Saturation 88% or higher                Exercise Comments:   Exercise Goals and Review:   Exercise Goals     Row Name 07/18/22 0955             Exercise Goals   Increase Physical Activity Yes       Intervention Provide advice, education, support and counseling about physical activity/exercise needs.;Develop an individualized exercise prescription for aerobic and resistive training based on initial evaluation  findings, risk stratification, comorbidities and participant's personal goals.       Expected Outcomes Short Term: Attend rehab on a regular basis to increase amount of physical activity.;Long Term: Add in home exercise to make exercise part of routine and to increase amount of physical activity.;Long Term: Exercising regularly at least 3-5 days a week.       Increase Strength and Stamina Yes       Intervention Provide advice, education, support and counseling about physical activity/exercise needs.;Develop an individualized exercise prescription for aerobic and resistive training based on initial evaluation findings, risk stratification, comorbidities and participant's personal goals.       Expected Outcomes Short Term: Increase workloads from initial exercise prescription for resistance, speed, and METs.;Short Term: Perform resistance training exercises routinely during rehab and add in resistance training at home;Long Term: Improve cardiorespiratory fitness, muscular endurance and strength as measured by increased METs and functional capacity ( )       Able to understand and use rate of perceived exertion (RPE) scale Yes       Intervention Provide education and explanation on how to use RPE scale       Expected Outcomes Short Term: Able to use RPE daily in rehab to express subjective intensity level;Long Term:  Able to use RPE to guide intensity level when exercising independently       Able to understand and use Dyspnea scale Yes       Intervention Provide education and explanation on how to use Dyspnea scale       Expected Outcomes Short Term: Able to use Dyspnea scale daily in rehab to express subjective sense of shortness of breath during exertion;Long  Term: Able to use Dyspnea scale to guide intensity level when exercising independently       Knowledge and understanding of Target Heart Rate Range (THRR) Yes       Intervention Provide education and explanation of THRR including how the numbers  were predicted and where they are located for reference       Expected Outcomes Short Term: Able to state/look up THRR;Short Term: Able to use daily as guideline for intensity in rehab;Long Term: Able to use THRR to govern intensity when exercising independently       Able to check pulse independently Yes       Intervention Provide education and demonstration on how to check pulse in carotid and radial arteries.;Review the importance of being able to check your own pulse for safety during independent exercise       Expected Outcomes Short Term: Able to explain why pulse checking is important during independent exercise;Long Term: Able to check pulse independently and accurately       Understanding of Exercise Prescription Yes       Intervention Provide education, explanation, and written materials on patient's individual exercise prescription       Expected Outcomes Short Term: Able to explain program exercise prescription;Long Term: Able to explain home exercise prescription to exercise independently                Exercise Goals Re-Evaluation :  Exercise Goals Re-Evaluation     Row Name 07/18/22 1033 08/01/22 1256 08/03/22 1055 08/22/22 0757 08/29/22 1053     Exercise Goal Re-Evaluation   Exercise Goals Review Able to understand and use rate of perceived exertion (RPE) scale;Able to understand and use Dyspnea scale;Understanding of Exercise Prescription -- Increase Physical Activity;Increase Strength and Stamina;Understanding of Exercise Prescription Increase Physical Activity;Increase Strength and Stamina;Understanding of Exercise Prescription Increase Physical Activity;Increase Strength and Stamina;Understanding of Exercise Prescription   Comments Reviewed RPE  and dyspnea scale and program prescription with pt today.  Pt voiced understanding and was given a copy of goals to take home. Pt has just started rehab and has not increased his workloads yet. He is exercising at an RPE of 12 on  both stations Pt states that some days he feels a little stronger since he started the program.  He has been able to go up on his intensity during the workouts in rehab.  He has been walking everyday, sometimes 2-3 times daily at home. Pt has increased his level on the XR to level 2. He is exercising at on RPE of 13 on the XR. Will continue to monitor and progress as able. Carlas is doing well in rehab.  He is starting to feel like his strength and stamina are returning.  Reviewed home exercise with pt today.  Pt plans to walking and playing basketball some at home for exercise.  He also belongs to [employer] for indoor needed days.  Reviewed THR, pulse, RPE, sign and symptoms, pulse oximetery and when to call 911 or MD.  Also discussed weather considerations and indoor options.  Pt voiced understanding.   Expected Outcomes Short: Use RPE daily to regulate intensity.  Long: Follow program prescription -- Short term:  go over home exercise routine  Long term:  Go up on his levels on the TM and elliptical in the program SHort term: go up in grade on the treadmill in the next two weeks   long term: continue to attend pulmonary rehab Short: start to add in walking  and weights at home consistently Long: conitnue to improve stamina    Row Name 09/22/22 1115 09/27/22 1300           Exercise Goal Re-Evaluation   Exercise Goals Review Increase Physical Activity;Increase Strength and Stamina;Understanding of Exercise Prescription Increase Physical Activity;Increase Strength and Stamina;Understanding of Exercise Prescription      Comments Rudell is doing well  in rehab.  Today, he is having a rough go with lacking his energy.  He missed Wednesday as he was called to [place] to get blood work done.  He will be graduating early next week as he is having surgery on 9/30.  He plans to continue to go the gym daily like he has been doing and using the treadmill.  He was encouraged to add in some weight lifting as well.  Markeise is tolerating exericse well. He was having RLE weakness but was pushing through. He is still walking at 2.5 speed with a 0.5 grade on the treadmilll but is exercising at an RPE of 15. He hjas increased the level of the XR to 4. Will continue to monitor and progress as able.      Expected Outcomes Short: Improve post Long: Continue to exercise independently Short: continue to exericse at current workloads   long term: continue to attend rehab               Discharge Exercise Prescription (Final Exercise Prescription Changes):  Exercise Prescription Changes - 09/27/22 1200       Response to Exercise   Blood Pressure (Admit) 120/80    Blood Pressure (Exit) 112/64    Heart Rate (Admit) 68 bpm    Heart Rate (Exercise) 93 bpm    Heart Rate (Exit) 74 bpm    Oxygen Saturation (Admit) 99 %    Oxygen Saturation (Exercise) 96 %    Oxygen Saturation (Exit) 99 %    Rating of Perceived Exertion (Exercise) 15    Perceived Dyspnea (Exercise) 2    Duration Continue with 30 min of aerobic exercise without signs/symptoms of physical distress.    Intensity THRR unchanged      Progression   Progression Continue to progress workloads to maintain intensity without signs/symptoms of physical distress.      Resistance Training   Training Prescription Yes    Weight 5 lbs    Reps 10-15      Treadmill   MPH 2.5    Grade 0.5    Minutes 15    METs 3.09      REL-XR   Level 4    Speed 54    Minutes 15    METs 2.9      Home Exercise Plan   Plans to continue exercise at Home (comment)    Frequency Add 3 additional days to program exercise sessions.      Oxygen   Maintain Oxygen Saturation 88% or higher             Nutrition:  Target Goals: Understanding of nutrition guidelines, daily intake of sodium 1500mg , cholesterol 200mg , calories 30% from fat and 7% or less from saturated fats, daily to have 5 or more servings of fruits and vegetables.  Biometrics:  Pre Biometrics -  07/18/22 1033       Pre Biometrics   Height 5\' 5"  (1.651 m)    Weight 172 lb 1.6 oz (78.1 kg)    Waist Circumference 34 inches    Hip Circumference 38 inches  Waist to Hip Ratio 0.89 %    BMI (Calculated) 28.64    Triceps Skinfold 15 mm    % Body Fat 25.4 %    Grip Strength 31.6 kg    Single Leg Stand 12.7 seconds              Nutrition Therapy Plan and Nutrition Goals:  Nutrition Therapy & Goals - 07/18/22 0755       Intervention Plan   Intervention Prescribe, educate and counsel regarding individualized specific dietary modifications aiming towards targeted core components such as weight, hypertension, lipid management, diabetes, heart failure and other comorbidities.;Nutrition handout(s) given to patient.    Expected Outcomes Short Term Goal: Understand basic principles of dietary content, such as calories, fat, sodium, cholesterol and nutrients.             Nutrition Assessments:  Nutrition Assessments - 07/18/22 1052       MEDFICTS Scores   Pre Score 116            MEDIFICTS Score Key: >=70 Need to make dietary changes  40-70 Heart Healthy Diet <= 40 Therapeutic Level Cholesterol Diet   Picture Your Plate Scores: <62 Unhealthy dietary pattern with much room for improvement. 41-50 Dietary pattern unlikely to meet recommendations for good health and room for improvement. 51-60 More healthful dietary pattern, with some room for improvement.  >60 Healthy dietary pattern, although there may be some specific behaviors that could be improved.    Nutrition Goals Re-Evaluation:  Nutrition Goals Re-Evaluation     Row Name 08/03/22 1102 08/29/22 1056 09/22/22 1127         Goals   Nutrition Goal Heart healthy diet with plenty of protein Short term: Pt will try to eat more grilled chicken vs red meat Long term: Try to work on healthier eating habits Short: Try more fruits and vegetables Long: continue to work towards more healthy eating     Comment The  pt loves fried foods and eats a lot of red meat, but he also tries to stay away from sweets and salty foods.  He has a good support system at home with his family members trying to help the pt try healthier foods. Fredy is doing well in rehab.  He is struggling with his diet.  He admits that his diet is not where it should be.  He loves fried foods and sweets.  He has been doing it for so long and enjoys it.  He has not found a lot of healthy foods he likes.  He has not tried too many though.  He has been getting more rotissorery chicken and makes them himself.  He does eat a banana each day. Tymir is doing well in rehab.He has continued to work on his diet.  He is not adding in more fruit but he is doing better with vegetables.  He is also working on portion control.  He is now doing more grilled and less fried food.  He wants to try baking more too.     Expected Outcome Short term:  Pt will try to eat more grilled chicken vs red meat Long term:  Try to work on healthier eating habits Short: Try more fruits and vegetables Long: continue to work towards more healthy eating short: Continue to try new foods Long: conitnue to improve diet              Nutrition Goals Discharge (Final Nutrition Goals Re-Evaluation):  Nutrition Goals Re-Evaluation - 09/22/22  1127       Goals   Nutrition Goal Short: Try more fruits and vegetables Long: continue to work towards more healthy eating    Comment Champ is doing well in rehab.He has continued to work on his diet.  He is not adding in more fruit but he is doing better with vegetables.  He is also working on portion control.  He is now doing more grilled and less fried food.  He wants to try baking more too.    Expected Outcome short: Continue to try new foods Long: conitnue to improve diet             Psychosocial: Target Goals: Acknowledge presence or absence of significant depression and/or stress, maximize coping skills, provide positive support  system. Participant is able to verbalize types and ability to use techniques and skills needed for reducing stress and depression.  Initial Review & Psychosocial Screening:  Initial Psych Review & Screening - 07/18/22 0753       Initial Review   Current issues with Current Stress Concerns;None Identified;Current Sleep Concerns    Source of Stress Concerns Chronic Illness    Comments only sleeps 3-4 hrs a night (chronic issue), SOB is limiting his ability to do things      Family Dynamics   Good Support System? Yes   wife and kids that come by to check weekly     Barriers   Psychosocial barriers to participate in program Psychosocial barriers identified (see note);The patient should benefit from training in stress management and relaxation.      Screening Interventions   Interventions Encouraged to exercise;Provide feedback about the scores to participant    Expected Outcomes Short Term goal: Utilizing psychosocial counselor, staff and physician to assist with identification of specific Stressors or current issues interfering with healing process. Setting desired goal for each stressor or current issue identified.;Long Term Goal: Stressors or current issues are controlled or eliminated.;Short Term goal: Identification and review with participant of any Quality of Life or Depression concerns found by scoring the questionnaire.;Long Term goal: The participant improves quality of Life and PHQ9 Scores as seen by post scores and/or verbalization of changes             Quality of Life Scores:  Quality of Life - 07/18/22 1052       Quality of Life   Select Quality of Life      Quality of Life Scores   Health/Function Pre 21.38 %    Socioeconomic Pre 29.14 %    Psych/Spiritual Pre 30 %    Family Pre 24 %    GLOBAL Pre 25.03 %            Scores of 19 and below usually indicate a poorer quality of life in these areas.  A difference of  2-3 points is a clinically meaningful  difference.  A difference of 2-3 points in the total score of the Quality of Life Index has been associated with significant improvement in overall quality of life, self-image, physical symptoms, and general health in studies assessing change in quality of life.   PHQ-9: Review Flowsheet       07/18/2022 01/09/2020 02/07/2011  Depression screen PHQ 2/9  Decreased Interest 0 0 0  Down, Depressed, Hopeless 0 0 0  PHQ - 2 Score 0 0 0  Altered sleeping 0 1 -  Tired, decreased energy 3 1 -  Change in appetite 0 0 -  Feeling bad or failure  about yourself  0 0 -  Trouble concentrating 0 0 -  Moving slowly or fidgety/restless 0 0 -  Suicidal thoughts 0 0 -  PHQ-9 Score 3 2 -  Difficult doing work/chores Not difficult at all Not difficult at all -    Details           Interpretation of Total Score  Total Score Depression Severity:  1-4 = Minimal depression, 5-9 = Mild depression, 10-14 = Moderate depression, 15-19 = Moderately severe depression, 20-27 = Severe depression   Psychosocial Evaluation and Intervention:  Psychosocial Evaluation - 07/18/22 1039       Psychosocial Evaluation & Interventions   Interventions Relaxation education;Encouraged to exercise with the program and follow exercise prescription    Comments Halton is coming into Pulmonary Rehab via the [place].  He requested to return to [place] as he has done program before and found in beneficial.  He used to help and support local high school, but has reitred from school as of last fall and has found that not being as active he is now feeling increased fatigue and DOE more than he has ever before.  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  He really wants to attend rehab to get moving again and to breathe better.  He also wants to boost his stamina and energy level so that he feels more like himself again.  He still goes for a walk most days but then finds he needs to rest  afterwards.  His daughter is a Systems analyst and she like all his kids are constantly on him about moving more.  He lives with his wife who is still working as well.  His kids all live near by and drop by to check in and call frequently.  He does not have any barriers to getting to rehab regularly.  He has a long history of not sleeping much and usually only gets 3-4 hrs a night and feels like that is all he needs.  He does use a CPAP at night but not fully compliant as he has a hard time getting it back on if and when he gets up to go to bathroom. He has history of frequent falls from shuffling his feet, so he is hoping more exercise will help with that too.    Expected Outcomes Short: Attend rehab regularly to build stamina Long: Continue to stay positive and feel better    Continue Psychosocial Services  Follow up required by staff             Psychosocial Re-Evaluation:  Psychosocial Re-Evaluation     Row Name 08/03/22 1122 08/29/22 1054 09/22/22 1122         Psychosocial Re-Evaluation   Current issues with Current Stress Concerns;Current Sleep Concerns Current Sleep Concerns Current Sleep Concerns     Comments Pt is currently stressed due to his upcoming lung bx next week.  He is most concerned about being put to sleep.  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  He does not want to try taking any sleep aids. Minoru is doing well in rehab. He is feeling good mentally and denies any major stressors.  He continues to struggle with sleep. He usually only sleeps 2-3 hrs at night then two to three 30-60 min naps during day.  He has never been one to  sleep a lot at once since driving his truck.  He is enjoying rehab and getting out to talk with people. Damir has done well in rehab.  He is planning to graduate next week before he has to have lung surgery.  He is breathing better and feels better overall. He is going to ask about whether he could  return post surgery.  He has enjoyed the program and would like to be able to continue.  He is still not sleeping well or using his CPAP     Expected Outcomes Short term:  Pt's stress will be relieved after his bx next week  Long term:  Pt will have no psychosocial barriers and continue to have a positive attitude Short: Continue to work on sleep Long: conitnue to attend rehab for mental boost Short: Talk to doctors about surgery and rehab Long: continue to exercise for mental boost     Interventions Stress management education;Relaxation education;Encouraged to attend Pulmonary Rehabilitation for the exercise Encouraged to attend Pulmonary Rehabilitation for the exercise Encouraged to attend Pulmonary Rehabilitation for the exercise     Continue Psychosocial Services  Follow up required by staff Follow up required by staff --       Initial Review   Source of Stress Concerns Chronic Illness -- --              Psychosocial Discharge (Final Psychosocial Re-Evaluation):  Psychosocial Re-Evaluation - 09/22/22 1122       Psychosocial Re-Evaluation   Current issues with Current Sleep Concerns    Comments Chiam has done well in rehab.  He is planning to graduate next week before he has to have lung surgery.  He is breathing better and feels better overall. He is going to ask about whether he could return post surgery.  He has enjoyed the program and would like to be able to continue.  He is still not sleeping well or using his CPAP    Expected Outcomes Short: Talk to doctors about surgery and rehab Long: continue to exercise for mental boost    Interventions Encouraged to attend Pulmonary Rehabilitation for the exercise              Education: Education Goals: Education classes will be provided on a weekly basis, covering required topics. Participant will state understanding/return demonstration of topics presented.  Learning Barriers/Preferences:  Learning Barriers/Preferences -  07/18/22 0752       Learning Barriers/Preferences   Learning Barriers Sight   glasses   Learning Preferences Skilled Demonstration             Education Topics: How Lungs Work and Diseases: - Discuss the anatomy of the lungs and diseases that can affect the lungs, such as COPD.   Exercise: -Discuss the importance of exercise, FITT principles of exercise, normal and abnormal responses to exercise, and how to exercise safely.   Environmental Irritants: -Discuss types of environmental irritants and how to limit exposure to environmental irritants. Flowsheet Row PULMONARY REHAB OTHER RESPIRATORY from 03/25/2020 in [facility] [facility]  Date 01/15/20  Educator DJ  Instruction Review Code 1- Verbalizes Understanding       Meds/Inhalers and oxygen: - Discuss respiratory medications, definition of an inhaler and oxygen, and the proper way to use an inhaler and oxygen. Flowsheet Row PULMONARY REHAB OTHER RESPIRATORY from 03/25/2020 in [place] [facility]  Date 01/15/20  Educator DJ       Energy Saving Techniques: - Discuss methods to conserve energy and  decrease shortness of breath when performing activities of daily living.  Flowsheet Row PULMONARY REHAB OTHER RESPIRATORY from 09/27/2022 in [place] [facility]  Date 08/17/22  Educator HB  Instruction Review Code 2- Demonstrated Understanding       Bronchial Hygiene / Breathing Techniques: - Discuss breathing mechanics, pursed-lip breathing technique,  proper posture, effective ways to clear airways, and other functional breathing techniques Flowsheet Row PULMONARY REHAB OTHER RESPIRATORY from 09/27/2022 in [place] [facility]  Date 07/27/22  Educator handout       Cleaning Equipment: - Provides group verbal and written instruction about the health risks of elevated stress, cause of high stress, and healthy ways to reduce stress. Flowsheet Row PULMONARY REHAB  OTHER RESPIRATORY from 09/27/2022 in [place] [facility]  Date 08/10/22  Educator [facility]  Instruction Review Code 1- Verbalizes Understanding       Nutrition I: Fats: - Discuss the types of cholesterol, what cholesterol does to the body, and how cholesterol levels can be controlled. Flowsheet Row PULMONARY REHAB OTHER RESPIRATORY from 09/27/2022 in [place] [facility]  Date 08/17/22  Educator HB  Instruction Review Code 2- Demonstrated Understanding       Nutrition II: Labels: -Discuss the different components of food labels and how to read food labels. Flowsheet Row PULMONARY REHAB OTHER RESPIRATORY from 03/25/2020 in [place] [facility]  Date 02/26/20  Educator DJ  Instruction Review Code 1- Verbalizes Understanding       Respiratory Infections: - Discuss the signs and symptoms of respiratory infections, ways to prevent respiratory infections, and the importance of seeking medical treatment when having a respiratory infection. Flowsheet Row PULMONARY REHAB OTHER RESPIRATORY from 03/25/2020 in [place] [facility]  Date 03/04/20  Educator Handout  Instruction Review Code 1- Verbalizes Understanding       Stress I: Signs and Symptoms: - Discuss the causes of stress, how stress may lead to anxiety and depression, and ways to limit stress. Flowsheet Row PULMONARY REHAB OTHER RESPIRATORY from 03/25/2020 in [place] [facility]  Date 03/11/20  Educator [facility]  Instruction Review Code 1- Verbalizes Understanding       Stress II: Relaxation: -Discuss relaxation techniques to limit stress. Flowsheet Row PULMONARY REHAB OTHER RESPIRATORY from 03/25/2020 in [place] [facility]  Date 03/18/20  Educator [facility]  Instruction Review Code 1- Verbalizes Understanding       Oxygen for Home/Travel: - Discuss how to prepare for travel when on oxygen and proper ways to transport and store oxygen to ensure  safety. Flowsheet Row PULMONARY REHAB OTHER RESPIRATORY from 03/25/2020 in [place] [facility]  Date 03/25/20  Educator DF  Instruction Review Code 2- Demonstrated Understanding       Knowledge Questionnaire Score:  Knowledge Questionnaire Score - 07/18/22 1100       Knowledge Questionnaire Score   Pre Score 12/18             Core Components/Risk Factors/Patient Goals at Admission:  Personal Goals and Risk Factors at Admission - 07/18/22 1100       Core Components/Risk Factors/Patient Goals on Admission    Weight Management Yes;Weight Loss    Intervention Weight Management: Develop a combined nutrition and exercise program designed to reach desired caloric intake, while maintaining appropriate intake of nutrient and fiber, sodium and fats, and appropriate energy expenditure required for the weight goal.;Weight Management: Provide education and appropriate resources to help participant work on and attain dietary goals.;Weight Management/Obesity: Establish reasonable short term and  long term weight goals.    Admit Weight 172 lb 1.6 oz (78.1 kg)    Goal Weight: Short Term 170 lb (77.1 kg)    Goal Weight: Long Term 170 lb (77.1 kg)    Expected Outcomes Short Term: Continue to assess and modify interventions until short term weight is achieved;Long Term: Adherence to nutrition and physical activity/exercise program aimed toward attainment of established weight goal;Weight Loss: Understanding of general recommendations for a balanced deficit meal plan, which promotes 1-2 lb weight loss per week and includes a negative energy balance of [phone] kcal/d;Understanding recommendations for meals to include 15-35% energy as protein, 25-35% energy from fat, 35-60% energy from carbohydrates, less than 200mg  of dietary cholesterol, 20-35 gm of total fiber daily;Understanding of distribution of calorie intake throughout the day with the consumption of 4-5 meals/snacks    Tobacco  Cessation Yes    Number of packs per day 3 per day (1 pack per week)    Intervention Assist the participant in steps to quit. Provide individualized education and counseling about committing to Tobacco Cessation, relapse prevention, and pharmacological support that can be provided by physician.;Education officer, environmental, assist with locating and accessing local/national Quit Smoking programs, and support quit date choice.    Expected Outcomes Short Term: Will demonstrate readiness to quit, by selecting a quit date.;Short Term: Will quit all tobacco product use, adhering to prevention of relapse plan.;Long Term: Complete abstinence from all tobacco products for at least 12 months from quit date.    Improve shortness of breath with ADL's Yes    Intervention Provide education, individualized exercise plan and daily activity instruction to help decrease symptoms of SOB with activities of daily living.    Expected Outcomes Short Term: Improve cardiorespiratory fitness to achieve a reduction of symptoms when performing ADLs;Long Term: Be able to perform more ADLs without symptoms or delay the onset of symptoms    Increase knowledge of respiratory medications and ability to use respiratory devices properly  Yes    Intervention Provide education and demonstration as needed of appropriate use of medications, inhalers, and oxygen therapy.    Expected Outcomes Long Term: Maintain appropriate use of medications, inhalers, and oxygen therapy.;Short Term: Achieves understanding of medications use. Understands that oxygen is a medication prescribed by physician. Demonstrates appropriate use of inhaler and oxygen therapy.    Hypertension Yes    Intervention Provide education on lifestyle modifcations including regular physical activity/exercise, weight management, moderate sodium restriction and increased consumption of fresh fruit, vegetables, and low fat dairy, alcohol moderation, and smoking cessation.;Monitor  prescription use compliance.    Expected Outcomes Long Term: Maintenance of blood pressure at goal levels.;Short Term: Continued assessment and intervention until BP is < 140/11mm HG in hypertensive participants. < 130/52mm HG in hypertensive participants with diabetes, heart failure or chronic kidney disease.    Lipids Yes    Intervention Provide education and support for participant on nutrition & aerobic/resistive exercise along with prescribed medications to achieve LDL 70mg , HDL >40mg .    Expected Outcomes Long Term: Cholesterol controlled with medications as prescribed, with individualized exercise RX and with personalized nutrition plan. Value goals: LDL < 70mg , HDL > 40 mg.;Short Term: Participant states understanding of desired cholesterol values and is compliant with medications prescribed. Participant is following exercise prescription and nutrition guidelines.             Core Components/Risk Factors/Patient Goals Review:   Goals and Risk Factor Review     Row Name 07/18/22 1110  08/03/22 1117 08/29/22 1059 09/22/22 1130       Core Components/Risk Factors/Patient Goals Review   Personal Goals Review Tobacco Cessation Weight Management/Obesity;Hypertension;Lipids;Improve shortness of breath with ADL's;Tobacco Cessation Weight Management/Obesity;Hypertension;Lipids;Improve shortness of breath with ADL's;Tobacco Cessation;Increase knowledge of respiratory medications and ability to use respiratory devices properly. Weight Management/Obesity;Hypertension;Lipids;Improve shortness of breath with ADL's;Tobacco Cessation;Increase knowledge of respiratory medications and ability to use respiratory devices properly.    Review Pt has worked from 1 ppd down to 3 cigarettes a day.  He would like to continue to work towards quitting.  He no longer takes them out of the house so is already making strides.  He was commended for his success already. Pt is still smoking 3-4 cigarretes a day.  He would  like to try stop smoking and has been using a nicotine filter to try to help.  He stated that he has tried multiple methods to stop smoking and none have worked yet.  Pt's weight has increased slightly from his admission weight. Isayah is doing well in rehab.  His weight has been steadily increasing as he is trying to quit smoking.  We talked about how most people will replace cigarettes with food as they are trying to quit.  He has been doing a lot of late night snacking.  He is still working on quitting smoking.  Thus far today, he has not had one.  He is trying to work on delaying his cravings til lunch. He is also got some more filters and paches to help as well.  He hasn't tried filter yet, but wants to try. His pressures are doing well in class but he has been getting high readings at home.  He was encouraged to bring his cuff in to check against [place].  His breathing is getting better.  He keeps his inhaler on him but has not needed to use it recently. Kaydn is scheduled for lung resection surgery on 10/02/22.  He is going to talk to his doctor about returning to rehab after surgery or to wait and completely restart.  His weight is holding steady.  He continues to work on cessation.  He is down to 3 cigarettes yesterday.  He is still trying to quit and has set a quit date for 9/27!! He is determined to quit before surgery.  He likes peppermint and found it helpful, but then was eating too much sugar.  His breathing has gotten better and he is doing well on his inhalers.  His pressures are doing well in class and at home.    Expected Outcomes Short: Continue to work towards quitting completely  Long: Complete cessation Short term:  Continue to work towards quitting smoking  Long term:  Complete cessation and weight loss Short: Bring in blood pressure cuff Long: conitnue to work on cessation. Short: Talk to doctor about surgey and quit smoking Long: continue to monitor risk factors             Core  Components/Risk Factors/Patient Goals at Discharge (Final Review):   Goals and Risk Factor Review - 09/22/22 1130       Core Components/Risk Factors/Patient Goals Review   Personal Goals Review Weight Management/Obesity;Hypertension;Lipids;Improve shortness of breath with ADL's;Tobacco Cessation;Increase knowledge of respiratory medications and ability to use respiratory devices properly.    Review Thermon is scheduled for lung resection surgery on 10/02/22.  He is going to talk to his doctor about returning to rehab after surgery or to wait and completely restart.  His weight is holding steady.  He continues to work on cessation.  He is down to 3 cigarettes yesterday.  He is still trying to quit and has set a quit date for 9/27!! He is determined to quit before surgery.  He likes peppermint and found it helpful, but then was eating too much sugar.  His breathing has gotten better and he is doing well on his inhalers.  His pressures are doing well in class and at home.    Expected Outcomes Short: Talk to doctor about surgey and quit smoking Long: continue to monitor risk factors             ITP Comments:  ITP Comments     Row Name 07/18/22 0941 07/19/22 1324 07/25/22 1052 08/16/22 1202 09/13/22 0826   ITP Comments Patient attend orientation today.  Patient is attendingPulmonary Rehabilitation Program.  Documentation for diagnosis can be found in [place] notes 03/28/22 and 07/12/22.  Reviewed medical chart, RPE/RPD, gym safety, and program guidelines.  Patient was fitted to equipment they will be using during rehab.  Patient is scheduled to start exercise on 07/20/22 at 1045. 30 day review completed. ITP sent to Dr.Jehanzeb Memon, Medical Director of  Pulmonary Rehab. Continue with ITP unless changes are made by physician.  Pt is still brand new, has yet to start exercise. First full day of exercise!  Patient was oriented to gym and equipment including functions, settings, policies, and procedures.   Patient's individual exercise prescription and treatment plan were reviewed.  All starting workloads were established based on the results of the 6 minute walk test done at initial orientation visit.  The plan for exercise progression was also introduced and progression will be customized based on patient's performance and goals. 30 day review completed. ITP sent to Dr.Jehanzeb Memon, Medical Director of  Pulmonary Rehab. Continue with ITP unless changes are made by physician. New to program 30 day review completed. ITP sent to Dr.Jehanzeb Memon, Medical Director of  Pulmonary Rehab. Continue with ITP unless changes are made by physician.    Row Name 10/06/22 1533 10/11/22 0849         ITP Comments Left VM regarding his lung resection surgery and his plans to return to pulmonary rehab. 30 day review completed. ITP sent to Dr.Jehanzeb Memon, Medical Director of  Pulmonary Rehab. Continue with ITP unless changes are made by physician.  Pt out on medical hold, unable to assess for goals.               Comments: 30 day review

## 2022-10-12 ENCOUNTER — Encounter (HOSPITAL_COMMUNITY): Payer: No Typology Code available for payment source

## 2022-10-13 ENCOUNTER — Encounter (HOSPITAL_COMMUNITY): Payer: No Typology Code available for payment source

## 2022-10-16 ENCOUNTER — Encounter (HOSPITAL_COMMUNITY): Payer: No Typology Code available for payment source

## 2022-10-17 ENCOUNTER — Encounter (HOSPITAL_COMMUNITY): Payer: No Typology Code available for payment source

## 2022-10-18 ENCOUNTER — Encounter (HOSPITAL_COMMUNITY): Payer: No Typology Code available for payment source

## 2022-10-19 ENCOUNTER — Encounter (HOSPITAL_COMMUNITY): Payer: No Typology Code available for payment source

## 2022-10-19 ENCOUNTER — Encounter (HOSPITAL_COMMUNITY): Payer: Self-pay | Admitting: *Deleted

## 2022-10-19 DIAGNOSIS — R0609 Other forms of dyspnea: Secondary | ICD-10-CM

## 2022-10-19 DIAGNOSIS — J449 Chronic obstructive pulmonary disease, unspecified: Secondary | ICD-10-CM

## 2022-10-19 NOTE — Progress Notes (Signed)
Discharge Progress Report  Patient Details  Name: Trevor Key MRN: [id number] Date of Birth: 1955-01-21 Referring Provider:   Flowsheet Row PULMONARY REHAB COPD ORIENTATION from 07/18/2022 in [facility] CARDIAC REH[facility]ILITATION  Referring Provider Vito Berger, NP  [Dr. Pernell Dupre (attending MD)]        Number of Visits: 22  Reason for Discharge:  Early Exit:  Surgical Admission  Smoking History:  Social History   Tobacco Use  Smoking Status Every Day   Current packs/day: 0.25   Average packs/day: 0.3 packs/day for 40.0 years (10.0 ttl pk-yrs)   Types: Cigarettes  Smokeless Tobacco Never  Tobacco Comments   He is currently enrolled in a smoking cessation program through the [place]. He reports that it is working.    Diagnosis:  Dyspnea on exertion  Chronic obstructive pulmonary disease, unspecified COPD type (HCC)  ADL UCSD:  Pulmonary Assessment Scores     Row Name 07/18/22 1123         ADL UCSD   ADL Phase Entry     SOB Score total 87     Rest 0     Walk 1     Stairs 5     Bath 3     Dress 3     Shop 5       CAT Score   CAT Score 22       mMRC Score   mMRC Score 1              Initial Exercise Prescription:  Initial Exercise Prescription - 07/18/22 0900       Date of Initial Exercise RX and Referring Provider   Date 07/18/22    Referring Provider Vito Berger, NP   Dr. Pernell Dupre (attending MD)     Oxygen   Maintain Oxygen Saturation 88% or higher      Treadmill   MPH 2.5    Grade 0.5    Minutes 15    METs 3.09      REL-XR   Level 3    Speed 50    Minutes 15    METs 3.2      Prescription Details   Frequency (times per week) 2    Duration Progress to 30 minutes of continuous aerobic without signs/symptoms of physical distress      Intensity   THRR 40-80% of Max Heartrate 98-135    Ratings of Perceived Exertion 11-13    Perceived Dyspnea 0-4      Progression   Progression Continue to progress workloads to maintain  intensity without signs/symptoms of physical distress.      Resistance Training   Training Prescription Yes    Weight 5 lb    Reps 10-15             Discharge Exercise Prescription (Final Exercise Prescription Changes):  Exercise Prescription Changes - 09/27/22 1200       Response to Exercise   Blood Pressure (Admit) 120/80    Blood Pressure (Exit) 112/64    Heart Rate (Admit) 68 bpm    Heart Rate (Exercise) 93 bpm    Heart Rate (Exit) 74 bpm    Oxygen Saturation (Admit) 99 %    Oxygen Saturation (Exercise) 96 %    Oxygen Saturation (Exit) 99 %    Rating of Perceived Exertion (Exercise) 15    Perceived Dyspnea (Exercise) 2    Duration Continue with 30 min of aerobic exercise without signs/symptoms of physical distress.  Intensity THRR unchanged      Progression   Progression Continue to progress workloads to maintain intensity without signs/symptoms of physical distress.      Resistance Training   Training Prescription Yes    Weight 5 lbs    Reps 10-15      Treadmill   MPH 2.5    Grade 0.5    Minutes 15    METs 3.09      REL-XR   Level 4    Speed 54    Minutes 15    METs 2.9      Home Exercise Plan   Plans to continue exercise at Home (comment)    Frequency Add 3 additional days to program exercise sessions.      Oxygen   Maintain Oxygen Saturation 88% or higher             Functional Capacity:  6 Minute Walk     Row Name 07/18/22 0944         6 Minute Walk   Phase Initial     Distance 1374 feet     Walk Time 6 minutes     # of Rest Breaks 0     MPH 2.6     METS 3.28     RPE 12     Perceived Dyspnea  3     VO2 Peak 11.49     Symptoms Yes (comment)     Comments SOB, chest pain 3/10 at end     Resting HR 61 bpm     Resting BP 146/74     Resting Oxygen Saturation  97 %     Exercise Oxygen Saturation  during 6 min walk 95 %     Max Ex. HR 89 bpm     Max Ex. BP 176/64     2 Minute Post BP 132/64       Interval HR   1 Minute HR  65     2 Minute HR --  error     3 Minute HR --  error     4 Minute HR --  error     5 Minute HR --  error     6 Minute HR 89     2 Minute Post HR 69     Interval Heart Rate? Yes       Interval Oxygen   Interval Oxygen? Yes     Baseline Oxygen Saturation % 97 %     1 Minute Oxygen Saturation % 96 %     1 Minute Liters of Oxygen 0 L  Room Air     2 Minute Oxygen Saturation % 95 %     2 Minute Liters of Oxygen 0 L     3 Minute Oxygen Saturation % 95 %     3 Minute Liters of Oxygen 0 L     4 Minute Oxygen Saturation % 95 %     4 Minute Liters of Oxygen 0 L     5 Minute Oxygen Saturation % 95 %     5 Minute Liters of Oxygen 0 L     6 Minute Oxygen Saturation % 95 %     6 Minute Liters of Oxygen 0 L     2 Minute Post Oxygen Saturation % 96 %     2 Minute Post Liters of Oxygen 0 L              Psychological, QOL, Others -  Outcomes: PHQ 2/9:    07/18/2022   11:27 AM 01/09/2020   12:58 PM 02/07/2011   12:56 PM  Depression screen PHQ 2/9  Decreased Interest 0 0 0  Down, Depressed, Hopeless 0 0 0  PHQ - 2 Score 0 0 0  Altered sleeping 0 1   Tired, decreased energy 3 1   Change in appetite 0 0   Feeling bad or failure about yourself  0 0   Trouble concentrating 0 0   Moving slowly or fidgety/restless 0 0   Suicidal thoughts 0 0   PHQ-9 Score 3 2   Difficult doing work/chores Not difficult at all Not difficult at all     Quality of Life:  Quality of Life - 07/18/22 1052       Quality of Life   Select Quality of Life      Quality of Life Scores   Health/Function Pre 21.38 %    Socioeconomic Pre 29.14 %    Psych/Spiritual Pre 30 %    Family Pre 24 %    GLOBAL Pre 25.03 %              Nutrition & Weight - Outcomes:  Pre Biometrics - 07/18/22 1033       Pre Biometrics   Height 5\' 5"  (1.651 m)    Weight 172 lb 1.6 oz (78.1 kg)    Waist Circumference 34 inches    Hip Circumference 38 inches    Waist to Hip Ratio 0.89 %    BMI (Calculated) 28.64     Triceps Skinfold 15 mm    % Body Fat 25.4 %    Grip Strength 31.6 kg    Single Leg Stand 12.7 seconds

## 2022-10-19 NOTE — Progress Notes (Signed)
Cardiac Individual Treatment Plan  Patient Details  Name: Trevor Key MRN: [id number] Date of Birth: 1955-05-05 Referring Provider:   Flowsheet Row PULMONARY REHAB COPD ORIENTATION from 07/18/2022 in [facility]  Referring Provider Vito Berger, NP  [Dr. Pernell Dupre (attending MD)]       Initial Encounter Date:  Flowsheet Row PULMONARY REHAB COPD ORIENTATION from 07/18/2022 in [facility]  Date 07/18/22       Visit Diagnosis: Dyspnea on exertion  Chronic obstructive pulmonary disease, unspecified COPD type (HCC)  Patient's Home Medications on Admission:  Current Outpatient Medications:    albuterol (PROVENTIL HFA;VENTOLIN HFA) 108 (90 BASE) MCG/ACT inhaler, Inhale 1-2 puffs into the lungs every 6 (six) hours as needed for wheezing., Disp: 1 Inhaler, Rfl: 0   aspirin 81 MG chewable tablet, Chew 81 mg by mouth daily., Disp: , Rfl:    atorvastatin (LIPITOR) 80 MG tablet, Take 80 mg by mouth daily., Disp: , Rfl:    cholecalciferol (VITAMIN D3) 25 MCG (1000 UNIT) tablet, Take 1,000 Units by mouth daily., Disp: , Rfl:    losartan (COZAAR) 100 MG tablet, Take 100 mg by mouth daily., Disp: , Rfl:    metoprolol succinate (TOPROL-XL) 50 MG 24 hr tablet, Take 50 mg by mouth daily. Take with or immediately following a meal., Disp: , Rfl:    nitroGLYCERIN (NITROLINGUAL) 0.4 MG/SPRAY spray, Place 1 spray under the tongue every 5 (five) minutes as needed. For chest pain., Disp: , Rfl:    omeprazole (PRILOSEC) 20 MG capsule, Take 20 mg by mouth daily., Disp: , Rfl:    primidone (MYSOLINE) 50 MG tablet, Take 50 mg by mouth daily., Disp: , Rfl:    tiotropium (SPIRIVA) 18 MCG inhalation capsule, Place 18 mcg into inhaler and inhale daily., Disp: , Rfl:   Past Medical History: Past Medical History:  Diagnosis Date   Coronary atherosclerosis of native coronary artery    BMS LAD 1/13, LVEF 55-60%   Hypercholesteremia    Hypertension    MI  (myocardial infarction) (HCC)    AMI 1/13    Tobacco Use: Social History   Tobacco Use  Smoking Status Every Day   Current packs/day: 0.25   Average packs/day: 0.3 packs/day for 40.0 years (10.0 ttl pk-yrs)   Types: Cigarettes  Smokeless Tobacco Never  Tobacco Comments   He is currently enrolled in a smoking cessation program through the [place]. He reports that it is working.    Labs: Review Flowsheet       Latest Ref Rng & Units 03/27/2011 10/28/2011 05/19/2016 05/20/2016  Labs for ITP Cardiac and Pulmonary Rehab  Cholestrol 0 - 200 mg/dL 366  - - 440   LDL (calc) 0 - 99 mg/dL 66  - - 71   HDL-C >34 mg/dL 33  - - 33   Trlycerides <150 mg/dL 96  - - 93   Hemoglobin A1c 4.8 - 5.6 % - - - 5.9   TCO2 0 - 100 mmol/L - 26  28  -    Details            Capillary Blood Glucose: No results found for: "GLUCAP"   Exercise Target Goals: Exercise Program Goal: Individual exercise prescription set using results from initial 6 min walk test and THRR while considering  patient's activity barriers and safety.   Exercise Prescription Goal: Starting with aerobic activity 30 plus minutes a day, 3 days per week for initial exercise prescription. Provide home exercise prescription and  guidelines that participant acknowledges understanding prior to discharge.  Activity Barriers & Risk Stratification:  Activity Barriers & Cardiac Risk Stratification - 07/18/22 0756       Activity Barriers & Cardiac Risk Stratification   Activity Barriers Arthritis;Joint Problems;History of Falls   R shoulder bothersome            6 Minute Walk:  6 Minute Walk     Row Name 07/18/22 0944         6 Minute Walk   Phase Initial     Distance 1374 feet     Walk Time 6 minutes     # of Rest Breaks 0     MPH 2.6     METS 3.28     RPE 12     Perceived Dyspnea  3     VO2 Peak 11.49     Symptoms Yes (comment)     Comments SOB, chest pain 3/10 at end     Resting HR 61 bpm     Resting BP 146/74      Resting Oxygen Saturation  97 %     Exercise Oxygen Saturation  during 6 min walk 95 %     Max Ex. HR 89 bpm     Max Ex. BP 176/64     2 Minute Post BP 132/64       Interval HR   1 Minute HR 65     2 Minute HR --  error     3 Minute HR --  error     4 Minute HR --  error     5 Minute HR --  error     6 Minute HR 89     2 Minute Post HR 69     Interval Heart Rate? Yes       Interval Oxygen   Interval Oxygen? Yes     Baseline Oxygen Saturation % 97 %     1 Minute Oxygen Saturation % 96 %     1 Minute Liters of Oxygen 0 L  Room Air     2 Minute Oxygen Saturation % 95 %     2 Minute Liters of Oxygen 0 L     3 Minute Oxygen Saturation % 95 %     3 Minute Liters of Oxygen 0 L     4 Minute Oxygen Saturation % 95 %     4 Minute Liters of Oxygen 0 L     5 Minute Oxygen Saturation % 95 %     5 Minute Liters of Oxygen 0 L     6 Minute Oxygen Saturation % 95 %     6 Minute Liters of Oxygen 0 L     2 Minute Post Oxygen Saturation % 96 %     2 Minute Post Liters of Oxygen 0 L              Oxygen Initial Assessment:  Oxygen Initial Assessment - 07/18/22 1121       Home Oxygen   Home Oxygen Device None    Sleep Oxygen Prescription CPAP    Liters per minute 0    Home Exercise Oxygen Prescription None    Home Resting Oxygen Prescription None    Compliance with Home Oxygen Use No   does not put back on after getting up to go to bathroom     Initial 6 min Walk   Oxygen Used None  Program Oxygen Prescription   Program Oxygen Prescription None      Intervention   Short Term Goals To learn and exhibit compliance with exercise, home and travel O2 prescription;To learn and understand importance of monitoring SPO2 with pulse oximeter and demonstrate accurate use of the pulse oximeter.;To learn and understand importance of maintaining oxygen saturations>88%;To learn and demonstrate proper pursed lip breathing techniques or other breathing techniques. ;To learn and demonstrate  proper use of respiratory medications    Long  Term Goals Exhibits compliance with exercise, home  and travel O2 prescription;Verbalizes importance of monitoring SPO2 with pulse oximeter and return demonstration;Maintenance of O2 saturations>88%;Exhibits proper breathing techniques, such as pursed lip breathing or other method taught during program session;Compliance with respiratory medication;Demonstrates proper use of MDI's             Oxygen Re-Evaluation:  Oxygen Re-Evaluation     Row Name 07/18/22 1122 08/03/22 1127 08/04/22 1232 08/29/22 1105 09/22/22 1135     Program Oxygen Prescription   Program Oxygen Prescription -- None -- None None     Home Oxygen   Home Oxygen Device -- None -- None None   Sleep Oxygen Prescription -- CPAP -- CPAP CPAP   Liters per minute -- --  pt is unsure of the oxygen concentration with his CPAP -- -- --   Home Exercise Oxygen Prescription -- None -- None None   Home Resting Oxygen Prescription -- None -- None None   Compliance with Home Oxygen Use -- No  pt does not put his CPAP back on after he gets up at night to go to the bathroom -- Yes  Only wears when first go to sleep No     Goals/Expected Outcomes   Short Term Goals To learn and demonstrate proper pursed lip breathing techniques or other breathing techniques. ;To learn and understand importance of maintaining oxygen saturations>88%;To learn and understand importance of monitoring SPO2 with pulse oximeter and demonstrate accurate use of the pulse oximeter. To learn and understand importance of maintaining oxygen saturations>88%;To learn and demonstrate proper pursed lip breathing techniques or other breathing techniques. ;To learn and demonstrate proper use of respiratory medications;To learn and understand importance of monitoring SPO2 with pulse oximeter and demonstrate accurate use of the pulse oximeter. To learn and demonstrate proper pursed lip breathing techniques or other breathing  techniques.  To learn and understand importance of monitoring SPO2 with pulse oximeter and demonstrate accurate use of the pulse oximeter.;To learn and understand importance of maintaining oxygen saturations>88%;To learn and demonstrate proper pursed lip breathing techniques or other breathing techniques. ;To learn and demonstrate proper use of respiratory medications To learn and understand importance of monitoring SPO2 with pulse oximeter and demonstrate accurate use of the pulse oximeter.;To learn and understand importance of maintaining oxygen saturations>88%;To learn and demonstrate proper pursed lip breathing techniques or other breathing techniques. ;To learn and demonstrate proper use of respiratory medications   Long  Term Goals Exhibits proper breathing techniques, such as pursed lip breathing or other method taught during program session;Maintenance of O2 saturations>88%;Verbalizes importance of monitoring SPO2 with pulse oximeter and return demonstration Exhibits proper breathing techniques, such as pursed lip breathing or other method taught during program session;Maintenance of O2 saturations>88%;Verbalizes importance of monitoring SPO2 with pulse oximeter and return demonstration;Compliance with respiratory medication Exhibits proper breathing techniques, such as pursed lip breathing or other method taught during program session Verbalizes importance of monitoring SPO2 with pulse oximeter and return demonstration;Maintenance of O2 saturations>88%;Exhibits proper breathing  techniques, such as pursed lip breathing or other method taught during program session;Compliance with respiratory medication Verbalizes importance of monitoring SPO2 with pulse oximeter and return demonstration;Maintenance of O2 saturations>88%;Exhibits proper breathing techniques, such as pursed lip breathing or other method taught during program session;Compliance with respiratory medication   Comments Reviewed PLB technique  with pt.  Talked about how it works and it's importance in maintaining their exercise saturations. Pt is using PLB at times and using his inhalers as prescribed. Diaphragmatic and PLB breathing explained and performed with patient. Patient has a better understanding of how to do these exercises to help with breathing performance and relaxation. Patient performed breathing techniques adequately and to practice further at home. Graysyn is doing well in rehab.  He is having a hard time with his CPAP at night. When he first goes to bed it works fine, but once he wakes at night, he has a hard time getting it back on properly.  He was encouraged to keep trying to get it back on properly when he takes it off.  He has ordered a pulse oximeter that should arrive this week.  He is feeling better with his breathing and feels like the PLB is working.  He has not needed his inhalers recently and feeling better. Robet continues to have a hard time with his CPAP.  He wants to talk to his doctor about it.  He is also scheduled for a lung resection on 10/02/22.  He is doing well on his meds.  He will also talk to his doctor about rehab.  He still hasn't gotten the pulse oximeter from the [place].   Goals/Expected Outcomes Short: Become more profiecient at using PLB.   Long: Become independent at using PLB. Short term:  Become more proficient at using PLB and inquire about a home pulse ox  Long term:  Continue compliance with resp medications Short: practice PLB and diaphragmatic breathing at home. Long: Use PLB and diaphragmatic breathing independently Short: Start using pulse oximeter Long: continue to work on using CPAP Short: Ask about pulse oximeter again and rehab Long: Continue to work on breathin g            Oxygen Discharge (Final Oxygen Re-Evaluation):  Oxygen Re-Evaluation - 09/22/22 1135       Program Oxygen Prescription   Program Oxygen Prescription None      Home Oxygen   Home Oxygen Device None    Sleep  Oxygen Prescription CPAP    Home Exercise Oxygen Prescription None    Home Resting Oxygen Prescription None    Compliance with Home Oxygen Use No      Goals/Expected Outcomes   Short Term Goals To learn and understand importance of monitoring SPO2 with pulse oximeter and demonstrate accurate use of the pulse oximeter.;To learn and understand importance of maintaining oxygen saturations>88%;To learn and demonstrate proper pursed lip breathing techniques or other breathing techniques. ;To learn and demonstrate proper use of respiratory medications    Long  Term Goals Verbalizes importance of monitoring SPO2 with pulse oximeter and return demonstration;Maintenance of O2 saturations>88%;Exhibits proper breathing techniques, such as pursed lip breathing or other method taught during program session;Compliance with respiratory medication    Comments Robet continues to have a hard time with his CPAP.  He wants to talk to his doctor about it.  He is also scheduled for a lung resection on 10/02/22.  He is doing well on his meds.  He will also talk to his doctor about  rehab.  He still hasn't gotten the pulse oximeter from the [place].    Goals/Expected Outcomes Short: Ask about pulse oximeter again and rehab Long: Continue to work on breathin g             Initial Exercise Prescription:  Initial Exercise Prescription - 07/18/22 0900       Date of Initial Exercise RX and Referring Provider   Date 07/18/22    Referring Provider Vito Berger, NP   Dr. Pernell Dupre (attending MD)     Oxygen   Maintain Oxygen Saturation 88% or higher      Treadmill   MPH 2.5    Grade 0.5    Minutes 15    METs 3.09      REL-XR   Level 3    Speed 50    Minutes 15    METs 3.2      Prescription Details   Frequency (times per week) 2    Duration Progress to 30 minutes of continuous aerobic without signs/symptoms of physical distress      Intensity   THRR 40-80% of Max Heartrate 98-135    Ratings of Perceived  Exertion 11-13    Perceived Dyspnea 0-4      Progression   Progression Continue to progress workloads to maintain intensity without signs/symptoms of physical distress.      Resistance Training   Training Prescription Yes    Weight 5 lb    Reps 10-15             Perform Capillary Blood Glucose checks as needed.  Exercise Prescription Changes:   Exercise Prescription Changes     Row Name 07/18/22 0900 08/01/22 1200 08/15/22 1200 08/29/22 1000 09/08/22 1500     Response to Exercise   Blood Pressure (Admit) 146/74 138/70 154/80 -- 156/70   Blood Pressure (Exercise) 176/64 144/80 142/80 -- --   Blood Pressure (Exit) 132/64 134/62 128/62 -- 130/74   Heart Rate (Admit) 61 bpm 62 bpm 69 bpm -- 64 bpm   Heart Rate (Exercise) 89 bpm 87 bpm 85 bpm -- 89 bpm   Heart Rate (Exit) 72 bpm 56 bpm 66 bpm -- 65 bpm   Oxygen Saturation (Admit) 97 % 97 % 98 % -- 98 %   Oxygen Saturation (Exercise) 95 % 99 % 98 % -- 97 %   Oxygen Saturation (Exit) 97 % 98 % 99 % -- 99 %   Rating of Perceived Exertion (Exercise) 12 12 13  -- 15   Perceived Dyspnea (Exercise) 3 2 3  -- 3   Symptoms SOB, chest pain (3/10) -- -- -- --   Comments walk test results -- -- -- --   Duration -- Continue with 30 min of aerobic exercise without signs/symptoms of physical distress. Continue with 30 min of aerobic exercise without signs/symptoms of physical distress. -- Continue with 30 min of aerobic exercise without signs/symptoms of physical distress.   Intensity -- THRR unchanged THRR unchanged -- THRR unchanged     Progression   Progression -- Continue to progress workloads to maintain intensity without signs/symptoms of physical distress. Continue to progress workloads to maintain intensity without signs/symptoms of physical distress. -- Continue to progress workloads to maintain intensity without signs/symptoms of physical distress.     Resistance Training   Training Prescription -- Yes Yes -- Yes   Weight -- 5 5 --  4   Reps -- 10-15 10-15 -- 10-15     Treadmill   MPH --  2.5 2.5 -- 2.5   Grade -- 0.5 0.5 -- 0.5   Minutes -- 15 15 -- 15   METs -- 3.09 3.09 -- 3.09     REL-XR   Level -- 1 2 -- 1   Speed -- 44 45 -- 45   Minutes -- 15 15 -- 15   METs -- 2.3 1.8 -- 2.8     Home Exercise Plan   Plans to continue exercise at -- -- -- Home (comment)  walking, [employer] [facility] (comment)   Frequency -- -- -- Add 3 additional days to program exercise sessions. Add 3 additional days to program exercise sessions.   Initial Home Exercises Provided -- -- -- 08/29/22 --     Oxygen   Maintain Oxygen Saturation -- 88% or higher 88% or higher -- 88% or higher    Row Name 09/27/22 1200             Response to Exercise   Blood Pressure (Admit) 120/80       Blood Pressure (Exit) 112/64       Heart Rate (Admit) 68 bpm       Heart Rate (Exercise) 93 bpm       Heart Rate (Exit) 74 bpm       Oxygen Saturation (Admit) 99 %       Oxygen Saturation (Exercise) 96 %       Oxygen Saturation (Exit) 99 %       Rating of Perceived Exertion (Exercise) 15       Perceived Dyspnea (Exercise) 2       Duration Continue with 30 min of aerobic exercise without signs/symptoms of physical distress.       Intensity THRR unchanged         Progression   Progression Continue to progress workloads to maintain intensity without signs/symptoms of physical distress.         Resistance Training   Training Prescription Yes       Weight 5 lbs       Reps 10-15         Treadmill   MPH 2.5       Grade 0.5       Minutes 15       METs 3.09         REL-XR   Level 4       Speed 54       Minutes 15       METs 2.9         Home Exercise Plan   Plans to continue exercise at Home (comment)       Frequency Add 3 additional days to program exercise sessions.         Oxygen   Maintain Oxygen Saturation 88% or higher                Exercise Comments:   Exercise Goals and Review:   Exercise Goals     Row Name  07/18/22 0955             Exercise Goals   Increase Physical Activity Yes       Intervention Provide advice, education, support and counseling about physical activity/exercise needs.;Develop an individualized exercise prescription for aerobic and resistive training based on initial evaluation findings, risk stratification, comorbidities and participant's personal goals.       Expected Outcomes Short Term: Attend rehab on a regular basis to increase amount of physical activity.;Long Term: Add in home  exercise to make exercise part of routine and to increase amount of physical activity.;Long Term: Exercising regularly at least 3-5 days a week.       Increase Strength and Stamina Yes       Intervention Provide advice, education, support and counseling about physical activity/exercise needs.;Develop an individualized exercise prescription for aerobic and resistive training based on initial evaluation findings, risk stratification, comorbidities and participant's personal goals.       Expected Outcomes Short Term: Increase workloads from initial exercise prescription for resistance, speed, and METs.;Short Term: Perform resistance training exercises routinely during rehab and add in resistance training at home;Long Term: Improve cardiorespiratory fitness, muscular endurance and strength as measured by increased METs and functional capacity ( )       Able to understand and use rate of perceived exertion (RPE) scale Yes       Intervention Provide education and explanation on how to use RPE scale       Expected Outcomes Short Term: Able to use RPE daily in rehab to express subjective intensity level;Long Term:  Able to use RPE to guide intensity level when exercising independently       Able to understand and use Dyspnea scale Yes       Intervention Provide education and explanation on how to use Dyspnea scale       Expected Outcomes Short Term: Able to use Dyspnea scale daily in rehab to express  subjective sense of shortness of breath during exertion;Long Term: Able to use Dyspnea scale to guide intensity level when exercising independently       Knowledge and understanding of Target Heart Rate Range (THRR) Yes       Intervention Provide education and explanation of THRR including how the numbers were predicted and where they are located for reference       Expected Outcomes Short Term: Able to state/look up THRR;Short Term: Able to use daily as guideline for intensity in rehab;Long Term: Able to use THRR to govern intensity when exercising independently       Able to check pulse independently Yes       Intervention Provide education and demonstration on how to check pulse in carotid and radial arteries.;Review the importance of being able to check your own pulse for safety during independent exercise       Expected Outcomes Short Term: Able to explain why pulse checking is important during independent exercise;Long Term: Able to check pulse independently and accurately       Understanding of Exercise Prescription Yes       Intervention Provide education, explanation, and written materials on patient's individual exercise prescription       Expected Outcomes Short Term: Able to explain program exercise prescription;Long Term: Able to explain home exercise prescription to exercise independently                Exercise Goals Re-Evaluation :  Exercise Goals Re-Evaluation     Row Name 07/18/22 1033 08/01/22 1256 08/03/22 1055 08/22/22 0757 08/29/22 1053     Exercise Goal Re-Evaluation   Exercise Goals Review Able to understand and use rate of perceived exertion (RPE) scale;Able to understand and use Dyspnea scale;Understanding of Exercise Prescription -- Increase Physical Activity;Increase Strength and Stamina;Understanding of Exercise Prescription Increase Physical Activity;Increase Strength and Stamina;Understanding of Exercise Prescription Increase Physical Activity;Increase Strength  and Stamina;Understanding of Exercise Prescription   Comments Reviewed RPE  and dyspnea scale and program prescription with pt today.  Pt voiced understanding and was  given a copy of goals to take home. Pt has just started rehab and has not increased his workloads yet. He is exercising at an RPE of 12 on both stations Pt states that some days he feels a little stronger since he started the program.  He has been able to go up on his intensity during the workouts in rehab.  He has been walking everyday, sometimes 2-3 times daily at home. Pt has increased his level on the XR to level 2. He is exercising at on RPE of 13 on the XR. Will continue to monitor and progress as able. Caedmon is doing well in rehab.  He is starting to feel like his strength and stamina are returning.  Reviewed home exercise with pt today.  Pt plans to walking and playing basketball some at home for exercise.  He also belongs to [employer] for indoor needed days.  Reviewed THR, pulse, RPE, sign and symptoms, pulse oximetery and when to call 911 or MD.  Also discussed weather considerations and indoor options.  Pt voiced understanding.   Expected Outcomes Short: Use RPE daily to regulate intensity.  Long: Follow program prescription -- Short term:  go over home exercise routine  Long term:  Go up on his levels on the TM and elliptical in the program SHort term: go up in grade on the treadmill in the next two weeks   long term: continue to attend pulmonary rehab Short: start to add in walking and weights at home consistently Long: conitnue to improve stamina    Row Name 09/22/22 1115 09/27/22 1300           Exercise Goal Re-Evaluation   Exercise Goals Review Increase Physical Activity;Increase Strength and Stamina;Understanding of Exercise Prescription Increase Physical Activity;Increase Strength and Stamina;Understanding of Exercise Prescription      Comments Claudis is doing well  in rehab.  Today, he is having a rough go with  lacking his energy.  He missed Wednesday as he was called to [place] to get blood work done.  He will be graduating early next week as he is having surgery on 9/30.  He plans to continue to go the gym daily like he has been doing and using the treadmill.  He was encouraged to add in some weight lifting as well. Jaquante is tolerating exericse well. He was having RLE weakness but was pushing through. He is still walking at 2.5 speed with a 0.5 grade on the treadmilll but is exercising at an RPE of 15. He hjas increased the level of the XR to 4. Will continue to monitor and progress as able.      Expected Outcomes Short: Improve post Long: Continue to exercise independently Short: continue to exericse at current workloads   long term: continue to attend rehab                Discharge Exercise Prescription (Final Exercise Prescription Changes):  Exercise Prescription Changes - 09/27/22 1200       Response to Exercise   Blood Pressure (Admit) 120/80    Blood Pressure (Exit) 112/64    Heart Rate (Admit) 68 bpm    Heart Rate (Exercise) 93 bpm    Heart Rate (Exit) 74 bpm    Oxygen Saturation (Admit) 99 %    Oxygen Saturation (Exercise) 96 %    Oxygen Saturation (Exit) 99 %    Rating of Perceived Exertion (Exercise) 15    Perceived Dyspnea (Exercise) 2    Duration  Continue with 30 min of aerobic exercise without signs/symptoms of physical distress.    Intensity THRR unchanged      Progression   Progression Continue to progress workloads to maintain intensity without signs/symptoms of physical distress.      Resistance Training   Training Prescription Yes    Weight 5 lbs    Reps 10-15      Treadmill   MPH 2.5    Grade 0.5    Minutes 15    METs 3.09      REL-XR   Level 4    Speed 54    Minutes 15    METs 2.9      Home Exercise Plan   Plans to continue exercise at Home (comment)    Frequency Add 3 additional days to program exercise sessions.      Oxygen   Maintain Oxygen  Saturation 88% or higher             Nutrition:  Target Goals: Understanding of nutrition guidelines, daily intake of sodium 1500mg , cholesterol 200mg , calories 30% from fat and 7% or less from saturated fats, daily to have 5 or more servings of fruits and vegetables.  Biometrics:  Pre Biometrics - 07/18/22 1033       Pre Biometrics   Height 5\' 5"  (1.651 m)    Weight 172 lb 1.6 oz (78.1 kg)    Waist Circumference 34 inches    Hip Circumference 38 inches    Waist to Hip Ratio 0.89 %    BMI (Calculated) 28.64    Triceps Skinfold 15 mm    % Body Fat 25.4 %    Grip Strength 31.6 kg    Single Leg Stand 12.7 seconds              Nutrition Therapy Plan and Nutrition Goals:  Nutrition Therapy & Goals - 07/18/22 0755       Intervention Plan   Intervention Prescribe, educate and counsel regarding individualized specific dietary modifications aiming towards targeted core components such as weight, hypertension, lipid management, diabetes, heart failure and other comorbidities.;Nutrition handout(s) given to patient.    Expected Outcomes Short Term Goal: Understand basic principles of dietary content, such as calories, fat, sodium, cholesterol and nutrients.             Nutrition Assessments:  Nutrition Assessments - 07/18/22 1052       MEDFICTS Scores   Pre Score 116            MEDIFICTS Score Key: >=70 Need to make dietary changes  40-70 Heart Healthy Diet <= 40 Therapeutic Level Cholesterol Diet   Picture Your Plate Scores: <40 Unhealthy dietary pattern with much room for improvement. 41-50 Dietary pattern unlikely to meet recommendations for good health and room for improvement. 51-60 More healthful dietary pattern, with some room for improvement.  >60 Healthy dietary pattern, although there may be some specific behaviors that could be improved.    Nutrition Goals Re-Evaluation:  Nutrition Goals Re-Evaluation     Row Name 08/03/22 1102 08/29/22  1056 09/22/22 1127         Goals   Nutrition Goal Heart healthy diet with plenty of protein Short term: Pt will try to eat more grilled chicken vs red meat Long term: Try to work on healthier eating habits Short: Try more fruits and vegetables Long: continue to work towards more healthy eating     Comment The pt loves fried foods and eats a lot of  red meat, but he also tries to stay away from sweets and salty foods.  He has a good support system at home with his family members trying to help the pt try healthier foods. Tayvien is doing well in rehab.  He is struggling with his diet.  He admits that his diet is not where it should be.  He loves fried foods and sweets.  He has been doing it for so long and enjoys it.  He has not found a lot of healthy foods he likes.  He has not tried too many though.  He has been getting more rotissorery chicken and makes them himself.  He does eat a banana each day. Dandrea is doing well in rehab.He has continued to work on his diet.  He is not adding in more fruit but he is doing better with vegetables.  He is also working on portion control.  He is now doing more grilled and less fried food.  He wants to try baking more too.     Expected Outcome Short term:  Pt will try to eat more grilled chicken vs red meat Long term:  Try to work on healthier eating habits Short: Try more fruits and vegetables Long: continue to work towards more healthy eating short: Continue to try new foods Long: conitnue to improve diet              Nutrition Goals Discharge (Final Nutrition Goals Re-Evaluation):  Nutrition Goals Re-Evaluation - 09/22/22 1127       Goals   Nutrition Goal Short: Try more fruits and vegetables Long: continue to work towards more healthy eating    Comment Daemon is doing well in rehab.He has continued to work on his diet.  He is not adding in more fruit but he is doing better with vegetables.  He is also working on portion control.  He is now doing more  grilled and less fried food.  He wants to try baking more too.    Expected Outcome short: Continue to try new foods Long: conitnue to improve diet             Psychosocial: Target Goals: Acknowledge presence or absence of significant depression and/or stress, maximize coping skills, provide positive support system. Participant is able to verbalize types and ability to use techniques and skills needed for reducing stress and depression.  Initial Review & Psychosocial Screening:  Initial Psych Review & Screening - 07/18/22 0753       Initial Review   Current issues with Current Stress Concerns;None Identified;Current Sleep Concerns    Source of Stress Concerns Chronic Illness    Comments only sleeps 3-4 hrs a night (chronic issue), SOB is limiting his ability to do things      Family Dynamics   Good Support System? Yes   wife and kids that come by to check weekly     Barriers   Psychosocial barriers to participate in program Psychosocial barriers identified (see note);The patient should benefit from training in stress management and relaxation.      Screening Interventions   Interventions Encouraged to exercise;Provide feedback about the scores to participant    Expected Outcomes Short Term goal: Utilizing psychosocial counselor, staff and physician to assist with identification of specific Stressors or current issues interfering with healing process. Setting desired goal for each stressor or current issue identified.;Long Term Goal: Stressors or current issues are controlled or eliminated.;Short Term goal: Identification and review with participant of any Quality of  Life or Depression concerns found by scoring the questionnaire.;Long Term goal: The participant improves quality of Life and PHQ9 Scores as seen by post scores and/or verbalization of changes             Quality of Life Scores:  Quality of Life - 07/18/22 1052       Quality of Life   Select Quality of Life       Quality of Life Scores   Health/Function Pre 21.38 %    Socioeconomic Pre 29.14 %    Psych/Spiritual Pre 30 %    Family Pre 24 %    GLOBAL Pre 25.03 %            Scores of 19 and below usually indicate a poorer quality of life in these areas.  A difference of  2-3 points is a clinically meaningful difference.  A difference of 2-3 points in the total score of the Quality of Life Index has been associated with significant improvement in overall quality of life, self-image, physical symptoms, and general health in studies assessing change in quality of life.  PHQ-9: Review Flowsheet       07/18/2022 01/09/2020 02/07/2011  Depression screen PHQ 2/9  Decreased Interest 0 0 0  Down, Depressed, Hopeless 0 0 0  PHQ - 2 Score 0 0 0  Altered sleeping 0 1 -  Tired, decreased energy 3 1 -  Change in appetite 0 0 -  Feeling bad or failure about yourself  0 0 -  Trouble concentrating 0 0 -  Moving slowly or fidgety/restless 0 0 -  Suicidal thoughts 0 0 -  PHQ-9 Score 3 2 -  Difficult doing work/chores Not difficult at all Not difficult at all -    Details           Interpretation of Total Score  Total Score Depression Severity:  1-4 = Minimal depression, 5-9 = Mild depression, 10-14 = Moderate depression, 15-19 = Moderately severe depression, 20-27 = Severe depression   Psychosocial Evaluation and Intervention:  Psychosocial Evaluation - 07/18/22 1039       Psychosocial Evaluation & Interventions   Interventions Relaxation education;Encouraged to exercise with the program and follow exercise prescription    Comments Quadry is coming into Pulmonary Rehab via the [place].  He requested to return to [place] as he has done program before and found in beneficial.  He used to help and support local high school, but has reitred from school as of last fall and has found that not being as active he is now feeling increased fatigue and DOE more than he has ever before.  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  He really wants to attend rehab to get moving again and to breathe better.  He also wants to boost his stamina and energy level so that he feels more like himself again.  He still goes for a walk most days but then finds he needs to rest afterwards.  His daughter is a Systems analyst and she like all his kids are constantly on him about moving more.  He lives with his wife who is still working as well.  His kids all live near by and drop by to check in and call frequently.  He does not have any barriers to getting to rehab regularly.  He has a long history of not sleeping much and usually only gets 3-4 hrs  a night and feels like that is all he needs.  He does use a CPAP at night but not fully compliant as he has a hard time getting it back on if and when he gets up to go to bathroom. He has history of frequent falls from shuffling his feet, so he is hoping more exercise will help with that too.    Expected Outcomes Short: Attend rehab regularly to build stamina Long: Continue to stay positive and feel better    Continue Psychosocial Services  Follow up required by staff             Psychosocial Re-Evaluation:  Psychosocial Re-Evaluation     Row Name 08/03/22 1122 08/29/22 1054 09/22/22 1122         Psychosocial Re-Evaluation   Current issues with Current Stress Concerns;Current Sleep Concerns Current Sleep Concerns Current Sleep Concerns     Comments Pt is currently stressed due to his upcoming lung bx next week.  He is most concerned about being put to sleep.  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  He does not want to try taking any sleep aids. Aires is doing well in rehab. He is feeling good mentally and denies any major stressors.  He continues to struggle with sleep. He usually only sleeps 2-3 hrs at night then two to three 30-60 min naps during day.  He has never been one to  sleep a lot at once since driving his truck.  He is enjoying rehab and getting out to talk with people. Zameir has done well in rehab.  He is planning to graduate next week before he has to have lung surgery.  He is breathing better and feels better overall. He is going to ask about whether he could return post surgery.  He has enjoyed the program and would like to be able to continue.  He is still not sleeping well or using his CPAP     Expected Outcomes Short term:  Pt's stress will be relieved after his bx next week  Long term:  Pt will have no psychosocial barriers and continue to have a positive attitude Short: Continue to work on sleep Long: conitnue to attend rehab for mental boost Short: Talk to doctors about surgery and rehab Long: continue to exercise for mental boost     Interventions Stress management education;Relaxation education;Encouraged to attend Pulmonary Rehabilitation for the exercise Encouraged to attend Pulmonary Rehabilitation for the exercise Encouraged to attend Pulmonary Rehabilitation for the exercise     Continue Psychosocial Services  Follow up required by staff Follow up required by staff --       Initial Review   Source of Stress Concerns Chronic Illness -- --              Psychosocial Discharge (Final Psychosocial Re-Evaluation):  Psychosocial Re-Evaluation - 09/22/22 1122       Psychosocial Re-Evaluation   Current issues with Current Sleep Concerns    Comments Jontavius has done well in rehab.  He is planning to graduate next week before he has to have lung surgery.  He is breathing better and feels better overall. He is going to ask about whether he could return post surgery.  He has enjoyed the program and would like to be able to continue.  He is still not sleeping well or using his CPAP    Expected Outcomes Short: Talk to doctors about surgery and  rehab Long: continue to exercise for mental boost    Interventions Encouraged to attend Pulmonary Rehabilitation  for the exercise             Vocational Rehabilitation: Provide vocational rehab assistance to qualifying candidates.   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 Participant will state understanding/return demonstration of topics presented.  Learning Barriers/Preferences:  Learning Barriers/Preferences - 07/18/22 0752       Learning Barriers/Preferences   Learning Barriers Sight   glasses   Learning Preferences Skilled Demonstration             Education Topics: Hypertension, Hypertension Reduction -Define heart disease and high blood pressure. Discus how high blood pressure affects the body and ways to reduce high blood pressure.   Exercise and Your Heart -Discuss why it is important to exercise, the FITT principles of exercise, normal and abnormal responses to exercise, and how to exercise safely. Flowsheet Row PULMONARY REHAB OTHER RESPIRATORY from 09/27/2022 in [place] [facility]  Date 09/27/22  Educator jh  Instruction Review Code 1- Verbalizes Understanding       Angina -Discuss definition of angina, causes of angina, treatment of angina, and how to decrease risk of having angina.   Cardiac Medications -Review what the following cardiac medications are used for, how they affect the body, and side effects that may occur when taking the medications.  Medications include Aspirin, Beta blockers, calcium channel blockers, ACE Inhibitors, angiotensin receptor blockers, diuretics, digoxin, and antihyperlipidemics. Flowsheet Row PULMONARY REHAB OTHER RESPIRATORY from 09/27/2022 in [place] [facility]  Date 08/30/22  Educator DJ        Congestive Heart Failure -Discuss the definition of CHF, how to live with CHF, the signs and symptoms of CHF, and how keep track of weight and sodium intake.   Heart Disease and Intimacy -Discus the effect sexual activity has on the heart, how changes occur during intimacy as we age, and safety during sexual activity.   Smoking Cessation / COPD -Discuss different methods to quit smoking, the health benefits of quitting smoking, and the definition of COPD. Flowsheet Row PULMONARY REHAB OTHER RESPIRATORY from 09/27/2022 in [place] [facility]  Date 09/06/22  Educator jh  Instruction Review Code 1- Verbalizes Understanding       Nutrition I: Fats -Discuss the types of cholesterol, what cholesterol does to the heart, and how cholesterol levels can be controlled. Flowsheet Row PULMONARY REHAB OTHER RESPIRATORY from 09/27/2022 in [place] [facility]  Date 08/17/22  Educator HB  Instruction Review Code 2- Demonstrated Understanding       Nutrition II: Labels -Discuss the different components of food labels and how to read food label Flowsheet Row PULMONARY REHAB OTHER RESPIRATORY from 03/25/2020 in [place] [facility]  Date 02/26/20  Educator DJ  Instruction Review Code 1- Verbalizes Understanding       Heart Parts/Heart Disease and PAD -Discuss the anatomy of the heart, the pathway of blood circulation through the heart, and these are affected by heart disease.   Stress I: Signs and Symptoms -Discuss the causes of stress, how stress may lead to anxiety and depression, and ways to limit  stress. Flowsheet Row PULMONARY REHAB OTHER RESPIRATORY from 03/25/2020 in [place] [facility]  Date 03/11/20  Educator [facility]  Instruction Review Code 1- Verbalizes Understanding       Stress II: Relaxation -Discuss different types of relaxation techniques to limit stress. Flowsheet Row PULMONARY REHAB OTHER RESPIRATORY from  03/25/2020 in [place] [facility]  Date 03/18/20  Educator [facility]  Instruction Review Code 1- Verbalizes Understanding       Warning Signs of Stroke / TIA -Discuss definition of a stroke, what the signs and symptoms are of a stroke, and how to identify when someone is having stroke.   Knowledge Questionnaire Score:  Knowledge Questionnaire Score - 07/18/22 1100       Knowledge Questionnaire Score   Pre Score 12/18             Core Components/Risk Factors/Patient Goals at Admission:  Personal Goals and Risk Factors at Admission - 07/18/22 1100       Core Components/Risk Factors/Patient Goals on Admission    Weight Management Yes;Weight Loss    Intervention Weight Management: Develop a combined nutrition and exercise program designed to reach desired caloric intake, while maintaining appropriate intake of nutrient and fiber, sodium and fats, and appropriate energy expenditure required for the weight goal.;Weight Management: Provide education and appropriate resources to help participant work on and attain dietary goals.;Weight Management/Obesity: Establish reasonable short term and long term weight goals.    Admit Weight 172 lb 1.6 oz (78.1 kg)    Goal Weight: Short Term 170 lb (77.1 kg)    Goal Weight: Long Term 170 lb (77.1 kg)    Expected Outcomes Short Term: Continue to assess and modify interventions until short term weight is achieved;Long Term: Adherence to nutrition and physical activity/exercise program aimed toward attainment of established weight goal;Weight Loss: Understanding of general recommendations for a balanced deficit meal plan, which promotes 1-2 lb weight loss per week and includes a negative energy balance of [phone] kcal/d;Understanding recommendations for meals to include 15-35% energy as protein, 25-35% energy from fat, 35-60% energy from carbohydrates, less than 200mg  of dietary cholesterol, 20-35 gm of total fiber daily;Understanding of  distribution of calorie intake throughout the day with the consumption of 4-5 meals/snacks    Tobacco Cessation Yes    Number of packs per day 3 per day (1 pack per week)    Intervention Assist the participant in steps to quit. Provide individualized education and counseling about committing to Tobacco Cessation, relapse prevention, and pharmacological support that can be provided by physician.;Education officer, environmental, assist with locating and accessing local/national Quit Smoking programs, and support quit date choice.    Expected Outcomes Short Term: Will demonstrate readiness to quit, by selecting a quit date.;Short Term: Will quit all tobacco product use, adhering to prevention of relapse plan.;Long Term: Complete abstinence from all tobacco products for at least 12 months from quit date.    Improve shortness of breath with ADL's Yes    Intervention Provide education, individualized exercise plan and daily activity instruction to help decrease symptoms of SOB with activities of daily living.    Expected Outcomes Short Term: Improve cardiorespiratory fitness to achieve a reduction of symptoms when performing ADLs;Long Term: Be able to perform more ADLs without symptoms or delay the onset of symptoms    Increase knowledge of respiratory medications and ability to use respiratory devices properly  Yes    Intervention Provide education and demonstration as needed of appropriate use of medications, inhalers, and  oxygen therapy.    Expected Outcomes Long Term: Maintain appropriate use of medications, inhalers, and oxygen therapy.;Short Term: Achieves understanding of medications use. Understands that oxygen is a medication prescribed by physician. Demonstrates appropriate use of inhaler and oxygen therapy.    Hypertension Yes    Intervention Provide education on lifestyle modifcations including regular physical activity/exercise, weight management, moderate sodium restriction and increased  consumption of fresh fruit, vegetables, and low fat dairy, alcohol moderation, and smoking cessation.;Monitor prescription use compliance.    Expected Outcomes Long Term: Maintenance of blood pressure at goal levels.;Short Term: Continued assessment and intervention until BP is < 140/50mm HG in hypertensive participants. < 130/44mm HG in hypertensive participants with diabetes, heart failure or chronic kidney disease.    Lipids Yes    Intervention Provide education and support for participant on nutrition & aerobic/resistive exercise along with prescribed medications to achieve LDL 70mg , HDL >40mg .    Expected Outcomes Long Term: Cholesterol controlled with medications as prescribed, with individualized exercise RX and with personalized nutrition plan. Value goals: LDL < 70mg , HDL > 40 mg.;Short Term: Participant states understanding of desired cholesterol values and is compliant with medications prescribed. Participant is following exercise prescription and nutrition guidelines.             Core Components/Risk Factors/Patient Goals Review:   Goals and Risk Factor Review     Row Name 07/18/22 1110 08/03/22 1117 08/29/22 1059 09/22/22 1130       Core Components/Risk Factors/Patient Goals Review   Personal Goals Review Tobacco Cessation Weight Management/Obesity;Hypertension;Lipids;Improve shortness of breath with ADL's;Tobacco Cessation Weight Management/Obesity;Hypertension;Lipids;Improve shortness of breath with ADL's;Tobacco Cessation;Increase knowledge of respiratory medications and ability to use respiratory devices properly. Weight Management/Obesity;Hypertension;Lipids;Improve shortness of breath with ADL's;Tobacco Cessation;Increase knowledge of respiratory medications and ability to use respiratory devices properly.    Review Pt has worked from 1 ppd down to 3 cigarettes a day.  He would like to continue to work towards quitting.  He no longer takes them out of the house so is already  making strides.  He was commended for his success already. Pt is still smoking 3-4 cigarretes a day.  He would like to try stop smoking and has been using a nicotine filter to try to help.  He stated that he has tried multiple methods to stop smoking and none have worked yet.  Pt's weight has increased slightly from his admission weight. Itay is doing well in rehab.  His weight has been steadily increasing as he is trying to quit smoking.  We talked about how most people will replace cigarettes with food as they are trying to quit.  He has been doing a lot of late night snacking.  He is still working on quitting smoking.  Thus far today, he has not had one.  He is trying to work on delaying his cravings til lunch. He is also got some more filters and paches to help as well.  He hasn't tried filter yet, but wants to try. His pressures are doing well in class but he has been getting high readings at home.  He was encouraged to bring his cuff in to check against [place].  His breathing is getting better.  He keeps his inhaler on him but has not needed to use it recently. Trevor Key is scheduled for lung resection surgery on 10/02/22.  He is going to talk to his doctor about returning to rehab after surgery or to wait and completely restart.  His weight is  holding steady.  He continues to work on cessation.  He is down to 3 cigarettes yesterday.  He is still trying to quit and has set a quit date for 9/27!! He is determined to quit before surgery.  He likes peppermint and found it helpful, but then was eating too much sugar.  His breathing has gotten better and he is doing well on his inhalers.  His pressures are doing well in class and at home.    Expected Outcomes Short: Continue to work towards quitting completely  Long: Complete cessation Short term:  Continue to work towards quitting smoking  Long term:  Complete cessation and weight loss Short: Bring in blood pressure cuff Long: conitnue to work on cessation. Short: Talk  to doctor about surgey and quit smoking Long: continue to monitor risk factors             Core Components/Risk Factors/Patient Goals at Discharge (Final Review):   Goals and Risk Factor Review - 09/22/22 1130       Core Components/Risk Factors/Patient Goals Review   Personal Goals Review Weight Management/Obesity;Hypertension;Lipids;Improve shortness of breath with ADL's;Tobacco Cessation;Increase knowledge of respiratory medications and ability to use respiratory devices properly.    Review Trevor Key is scheduled for lung resection surgery on 10/02/22.  He is going to talk to his doctor about returning to rehab after surgery or to wait and completely restart.  His weight is holding steady.  He continues to work on cessation.  He is down to 3 cigarettes yesterday.  He is still trying to quit and has set a quit date for 9/27!! He is determined to quit before surgery.  He likes peppermint and found it helpful, but then was eating too much sugar.  His breathing has gotten better and he is doing well on his inhalers.  His pressures are doing well in class and at home.    Expected Outcomes Short: Talk to doctor about surgey and quit smoking Long: continue to monitor risk factors             ITP Comments:  ITP Comments     Row Name 07/18/22 0941 07/19/22 1324 07/25/22 1052 08/16/22 1202 09/13/22 0826   ITP Comments Patient attend orientation today.  Patient is attendingPulmonary Rehabilitation Program.  Documentation for diagnosis can be found in [place] notes 03/28/22 and 07/12/22.  Reviewed medical chart, RPE/RPD, gym safety, and program guidelines.  Patient was fitted to equipment they will be using during rehab.  Patient is scheduled to start exercise on 07/20/22 at 1045. 30 day review completed. ITP sent to Dr.Jehanzeb Memon, Medical Director of  Pulmonary Rehab. Continue with ITP unless changes are made by physician.  Pt is still brand new, has yet to start exercise. First full day of exercise!   Patient was oriented to gym and equipment including functions, settings, policies, and procedures.  Patient's individual exercise prescription and treatment plan were reviewed.  All starting workloads were established based on the results of the 6 minute walk test done at initial orientation visit.  The plan for exercise progression was also introduced and progression will be customized based on patient's performance and goals. 30 day review completed. ITP sent to Dr.Jehanzeb Memon, Medical Director of  Pulmonary Rehab. Continue with ITP unless changes are made by physician. New to program 30 day review completed. ITP sent to Dr.Jehanzeb Memon, Medical Director of  Pulmonary Rehab. Continue with ITP unless changes are made by physician.    Row Name 10/06/22 [phone]  10/11/22 0849 10/19/22 1541       ITP Comments Left VM regarding his lung resection surgery and his plans to return to pulmonary rehab. 30 day review completed. ITP sent to Dr.Jehanzeb Memon, Medical Director of  Pulmonary Rehab. Continue with ITP unless changes are made by physician.  Pt out on medical hold, unable to assess for goals. Pt continues to be out after surgery with resection.  We will discharge at this time.  He completed 22 sessions.              Comments: Discharge ITP

## 2022-10-20 ENCOUNTER — Encounter (HOSPITAL_COMMUNITY): Payer: No Typology Code available for payment source

## 2022-10-23 ENCOUNTER — Encounter (HOSPITAL_COMMUNITY): Payer: No Typology Code available for payment source

## 2022-10-24 ENCOUNTER — Encounter (HOSPITAL_COMMUNITY): Payer: No Typology Code available for payment source

## 2022-10-25 ENCOUNTER — Encounter (HOSPITAL_COMMUNITY): Payer: No Typology Code available for payment source

## 2022-10-26 ENCOUNTER — Encounter (HOSPITAL_COMMUNITY): Payer: No Typology Code available for payment source

## 2022-10-27 ENCOUNTER — Encounter (HOSPITAL_COMMUNITY): Payer: No Typology Code available for payment source

## 2022-10-30 ENCOUNTER — Encounter (HOSPITAL_COMMUNITY): Payer: No Typology Code available for payment source

## 2022-10-31 ENCOUNTER — Encounter (HOSPITAL_COMMUNITY): Payer: No Typology Code available for payment source

## 2022-11-01 ENCOUNTER — Encounter (HOSPITAL_COMMUNITY): Payer: No Typology Code available for payment source

## 2022-11-02 ENCOUNTER — Encounter (HOSPITAL_COMMUNITY): Payer: No Typology Code available for payment source

## 2022-11-07 ENCOUNTER — Encounter (HOSPITAL_COMMUNITY): Payer: No Typology Code available for payment source

## 2022-11-09 ENCOUNTER — Encounter (HOSPITAL_COMMUNITY): Payer: No Typology Code available for payment source

## 2022-11-14 ENCOUNTER — Encounter (HOSPITAL_COMMUNITY): Payer: No Typology Code available for payment source

## 2022-11-16 ENCOUNTER — Encounter (HOSPITAL_COMMUNITY): Payer: No Typology Code available for payment source

## 2022-11-21 ENCOUNTER — Encounter (HOSPITAL_COMMUNITY): Payer: No Typology Code available for payment source

## 2022-11-23 ENCOUNTER — Encounter (HOSPITAL_COMMUNITY): Payer: No Typology Code available for payment source

## 2022-11-28 ENCOUNTER — Encounter (HOSPITAL_COMMUNITY): Payer: No Typology Code available for payment source

## 2022-12-05 ENCOUNTER — Encounter (HOSPITAL_COMMUNITY): Payer: No Typology Code available for payment source

## 2023-10-05 ENCOUNTER — Ambulatory Visit: Admitting: Internal Medicine

## 2023-10-05 VITALS — BP 122/80 | HR 60 | Ht 65.0 in | Wt 147.0 lb

## 2023-10-05 DIAGNOSIS — F17210 Nicotine dependence, cigarettes, uncomplicated: Secondary | ICD-10-CM | POA: Diagnosis not present

## 2023-10-05 DIAGNOSIS — R935 Abnormal findings on diagnostic imaging of other abdominal regions, including retroperitoneum: Secondary | ICD-10-CM

## 2023-10-05 DIAGNOSIS — R109 Unspecified abdominal pain: Secondary | ICD-10-CM | POA: Diagnosis not present

## 2023-10-05 DIAGNOSIS — R1084 Generalized abdominal pain: Secondary | ICD-10-CM

## 2023-10-05 DIAGNOSIS — Z860100 Personal history of colon polyps, unspecified: Secondary | ICD-10-CM | POA: Diagnosis not present

## 2023-10-05 NOTE — Patient Instructions (Signed)
 Please follow up as needed.  _______________________________________________________  If your blood pressure at your visit was 140/90 or greater, please contact your primary care physician to follow up on this.  _______________________________________________________  If you are age 68 or older, your body mass index should be between 23-30. Your Body mass index is 24.46 kg/m. If this is out of the aforementioned range listed, please consider follow up with your Primary Care Provider.  If you are age 2 or younger, your body mass index should be between 19-25. Your Body mass index is 24.46 kg/m. If this is out of the aformentioned range listed, please consider follow up with your Primary Care Provider.   ________________________________________________________  The [facility] providers would like to encourage you to use MYCHART to communicate with providers for non-urgent requests or questions.  Due to long hold times on the telephone, sending your provider a message by [facility] may be a faster and more efficient way to get a response.  Please allow 48 business hours for a response.  Please remember that this is for non-urgent requests.  _______________________________________________________  [facility] is using a team-based approach to care.  Your team is made up of your doctor and two to three APPS. Our APPS (Nurse Practitioners and Physician Assistants) work with your physician to ensure care continuity for you. They are fully qualified to address your health concerns and develop a treatment plan. They communicate directly with your gastroenterologist to care for you. Seeing the Advanced Practice Practitioners on your physician's team can help you by facilitating care more promptly, often allowing for earlier appointments, access to diagnostic testing, procedures, and other specialty referrals.

## 2023-10-05 NOTE — Progress Notes (Signed)
 HISTORY OF PRESENT ILLNESS:  Trevor Key is a 68 y.o. male with multiple significant medical problems including COPD with ongoing tobacco abuse, history of lung cancer status post resection last year, hypertension, hyperlipidemia, myocardial infarction, CVA, and history of colon polyps.  He is sent today by the [facility] regarding problems with abdominal pain several months ago and an abnormal CT scan questioning colitis.  He is accompanied by his daughter  The patient has a history of colon polyps.  Previous colonoscopies with Dr. Shaaron in [place] 2008 and 2013.  He tells me that he subsequently had colonoscopy at the [facility].  His present history is that of abdominal pain which brought him to the [facility] emergency room July 30, 2023.  The cause for his pain was found to be kidney stones with obstruction in the right system.  He was incidentally noted to have an associated ileus and question of colonic thickening (underdistention versus colitis).  Also, question of under distention of the stomach.  He was having no issues with his bowels or diarrhea at that time.  He feels well now.  Good appetite.  Has had weight gain.  Having significant problems with his breathing.  Continues to smoke.  REVIEW OF SYSTEMS:  All non-GI ROS negative except for difficulty breathing  Past Medical History:  Diagnosis Date   Cerebral infarction [facility])    COPD (chronic obstructive pulmonary disease) (HCC)    Coronary atherosclerosis of native coronary artery    BMS LAD 1/13, LVEF 55-60%   Hypercholesteremia    Hypertension    Lung cancer (HCC)    MI (myocardial infarction) (HCC)    AMI 1/13   OSA (obstructive sleep apnea)    TIA (transient ischemic attack)     Past Surgical History:  Procedure Laterality Date   CARDIAC CATHETERIZATION     COLONOSCOPY  11/14/2006   Mottled around pedunculated polyp suspicious for carcinoid removed   cleanly with the hot snare likely represents cause of the patient's   hematochezia/  5 mm middescending and ascending colon polyps removed with cold snare technique, right sided diverticula.  Remainder of colonic mucosa appeared normal. Rectum path unremarkable. Desc colon polyp adenomatous. Next TCS 11/2011.    COLONOSCOPY  10/18/2011   Colonic diverticulosis. Colonic polyp as removed as described above   CORONARY STENT PLACEMENT      Social History NERO SAWATZKY  reports that he has been smoking cigarettes. He has a 10 pack-year smoking history. He has never used smokeless tobacco. He reports that he does not drink alcohol and does not use drugs.  family history includes Breast cancer in his mother; Coronary artery disease in his father; Diabetes type II in his father and mother; Heart attack in his father.  No Known Allergies     PHYSICAL EXAMINATION: Vital signs: BP 122/80   Pulse 60   Ht 5' 5 (1.651 m)   Wt 147 lb (66.7 kg)   BMI 24.46 kg/m   Constitutional: Chronically ill-appearing, no acute distress Psychiatric: alert and oriented x3, cooperative Eyes: extraocular movements intact, anicteric, conjunctiva pink Mouth: oral pharynx moist, no lesions Neck: supple no lymphadenopathy Cardiovascular: heart regular rate and rhythm, no murmur Lungs: Bilateral wheezing Abdomen: soft, nontender, nondistended, no obvious ascites, no peritoneal signs, normal bowel sounds, no organomegaly Rectal: Omitted Extremities: no clubbing, cyanosis, or lower extremity edema bilaterally Skin: no lesions on visible extremities Neuro: No focal deficits.  Cranial nerves intact  ASSESSMENT:  1.  Problems  with abdominal pain in July secondary to kidney stones.  Currently asymptomatic. 2.  Question of colitis on imaging.  Nothing clinically to suggest colitis.  Finding in keeping with underdistention of the colon. 3.  History of colon polyps.  Multiple colonoscopies elsewhere.  [facility].  Multiple significant problems.  [facility]   PLAN:  1.  Reassurance regarding imaging findings 2.  Encouraged to stop smoking 3.  No plans for endoscopic procedures. 4.  Resume care with the [facility] system. A total time of 60 minutes was spent preparing to see the patient (the majority of the time), obtaining comprehensive history, performing medically appropriate physical exam, counseling and educating the patient regarding the above listed issues, and documenting clinical information in the health record

## 2023-11-22 ENCOUNTER — Encounter: Payer: Self-pay | Admitting: Internal Medicine

## 2023-12-05 ENCOUNTER — Other Ambulatory Visit: Payer: Self-pay | Admitting: Internal Medicine

## 2023-12-05 DIAGNOSIS — R928 Other abnormal and inconclusive findings on diagnostic imaging of breast: Secondary | ICD-10-CM

## 2023-12-18 ENCOUNTER — Other Ambulatory Visit: Payer: Self-pay | Admitting: Internal Medicine

## 2023-12-18 DIAGNOSIS — Z1589 Genetic susceptibility to other disease: Secondary | ICD-10-CM

## 2024-01-11 ENCOUNTER — Encounter: Payer: Self-pay | Admitting: Internal Medicine

## 2024-01-11 ENCOUNTER — Other Ambulatory Visit: Payer: Self-pay | Admitting: Internal Medicine

## 2024-01-11 DIAGNOSIS — Z1589 Genetic susceptibility to other disease: Secondary | ICD-10-CM

## 2024-01-16 ENCOUNTER — Encounter

## 2024-02-01 ENCOUNTER — Encounter

## 2024-02-01 ENCOUNTER — Other Ambulatory Visit

## 2024-02-01 ENCOUNTER — Ambulatory Visit
Admission: RE | Admit: 2024-02-01 | Discharge: 2024-02-01 | Disposition: A | Source: Ambulatory Visit | Attending: Internal Medicine

## 2024-02-01 DIAGNOSIS — Z1589 Genetic susceptibility to other disease: Secondary | ICD-10-CM
